# Patient Record
Sex: Male | Born: 1950 | Race: White | Hispanic: No | Marital: Married | Smoking: Former smoker
Health system: Southern US, Community
[De-identification: ages and names within clinical notes are randomized; demographics above are authoritative.]

## PROBLEM LIST (undated history)

## (undated) DIAGNOSIS — R911 Solitary pulmonary nodule: Secondary | ICD-10-CM

## (undated) DIAGNOSIS — R609 Edema, unspecified: Secondary | ICD-10-CM

## (undated) DIAGNOSIS — I2510 Atherosclerotic heart disease of native coronary artery without angina pectoris: Secondary | ICD-10-CM

## (undated) DIAGNOSIS — I10 Essential (primary) hypertension: Secondary | ICD-10-CM

## (undated) DIAGNOSIS — B348 Other viral infections of unspecified site: Secondary | ICD-10-CM

## (undated) DIAGNOSIS — I739 Peripheral vascular disease, unspecified: Secondary | ICD-10-CM

## (undated) DIAGNOSIS — M1990 Unspecified osteoarthritis, unspecified site: Secondary | ICD-10-CM

## (undated) DIAGNOSIS — M25561 Pain in right knee: Secondary | ICD-10-CM

## (undated) DIAGNOSIS — I701 Atherosclerosis of renal artery: Secondary | ICD-10-CM

## (undated) DIAGNOSIS — J40 Bronchitis, not specified as acute or chronic: Secondary | ICD-10-CM

## (undated) DIAGNOSIS — I7090 Unspecified atherosclerosis: Secondary | ICD-10-CM

## (undated) DIAGNOSIS — Z87891 Personal history of nicotine dependence: Secondary | ICD-10-CM

## (undated) DIAGNOSIS — J449 Chronic obstructive pulmonary disease, unspecified: Secondary | ICD-10-CM

## (undated) DIAGNOSIS — M25562 Pain in left knee: Secondary | ICD-10-CM

## (undated) DIAGNOSIS — R945 Abnormal results of liver function studies: Secondary | ICD-10-CM

## (undated) DIAGNOSIS — D61818 Other pancytopenia: Secondary | ICD-10-CM

## (undated) DIAGNOSIS — E441 Mild protein-calorie malnutrition: Secondary | ICD-10-CM

## (undated) DIAGNOSIS — J9611 Chronic respiratory failure with hypoxia: Secondary | ICD-10-CM

## (undated) DIAGNOSIS — I6529 Occlusion and stenosis of unspecified carotid artery: Secondary | ICD-10-CM

## (undated) HISTORY — DX: Unspecified osteoarthritis, unspecified site: M19.90

## (undated) HISTORY — DX: Atherosclerosis of renal artery: I70.1

## (undated) HISTORY — DX: Chronic respiratory failure with hypoxia: J96.11

## (undated) HISTORY — DX: Solitary pulmonary nodule: R91.1

## (undated) HISTORY — DX: Mild protein-calorie malnutrition: E44.1

## (undated) HISTORY — DX: Occlusion and stenosis of unspecified carotid artery: I65.29

## (undated) HISTORY — DX: Peripheral vascular disease, unspecified: I73.9

## (undated) HISTORY — PX: PR VEIN BYPASS GRAFT,AORTO-FEM-POP: 35551

## (undated) HISTORY — DX: Bronchitis, not specified as acute or chronic: J40

## (undated) HISTORY — DX: Chronic obstructive pulmonary disease, unspecified: J44.9

## (undated) HISTORY — PX: CARDIAC CATHETERIZATION: SHX172

## (undated) HISTORY — DX: Other pancytopenia: D61.818

## (undated) HISTORY — DX: Atherosclerotic heart disease of native coronary artery without angina pectoris: I25.10

## (undated) HISTORY — DX: Other viral infections of unspecified site: B34.8

## (undated) HISTORY — DX: Unspecified atherosclerosis: I70.90

## (undated) HISTORY — DX: Pain in left knee: M25.562

## (undated) HISTORY — DX: Pain in right knee: M25.561

## (undated) HISTORY — PX: KNEE SURGERY: SHX244

## (undated) HISTORY — DX: Essential (primary) hypertension: I10

## (undated) HISTORY — DX: Edema, unspecified: R60.9

## (undated) HISTORY — DX: Personal history of nicotine dependence: Z87.891

---

## 2002-12-07 DIAGNOSIS — I701 Atherosclerosis of renal artery: Secondary | ICD-10-CM

## 2002-12-07 HISTORY — DX: Atherosclerosis of renal artery: I70.1

## 2003-01-09 HISTORY — PX: JOINT REPLACEMENT: SHX530

## 2003-05-20 ENCOUNTER — Inpatient Hospital Stay (HOSPITAL_COMMUNITY): Admission: EM | Admit: 2003-05-20 | Discharge: 2003-05-22 | Payer: Self-pay | Admitting: Internal Medicine

## 2005-04-09 ENCOUNTER — Ambulatory Visit: Payer: Self-pay | Admitting: Cardiology

## 2011-09-05 ENCOUNTER — Other Ambulatory Visit: Payer: Self-pay

## 2011-09-05 DIAGNOSIS — I70219 Atherosclerosis of native arteries of extremities with intermittent claudication, unspecified extremity: Secondary | ICD-10-CM

## 2011-09-24 ENCOUNTER — Encounter: Payer: Self-pay | Admitting: Vascular Surgery

## 2011-09-25 ENCOUNTER — Encounter (INDEPENDENT_AMBULATORY_CARE_PROVIDER_SITE_OTHER): Payer: Medicare Other | Admitting: *Deleted

## 2011-09-25 ENCOUNTER — Ambulatory Visit (INDEPENDENT_AMBULATORY_CARE_PROVIDER_SITE_OTHER): Payer: Medicare Other | Admitting: Vascular Surgery

## 2011-09-25 ENCOUNTER — Ambulatory Visit (INDEPENDENT_AMBULATORY_CARE_PROVIDER_SITE_OTHER): Payer: Medicare Other | Admitting: *Deleted

## 2011-09-25 ENCOUNTER — Encounter: Payer: Self-pay | Admitting: Vascular Surgery

## 2011-09-25 VITALS — BP 107/67 | HR 98 | Resp 20 | Ht 72.0 in | Wt 182.0 lb

## 2011-09-25 DIAGNOSIS — Z48812 Encounter for surgical aftercare following surgery on the circulatory system: Secondary | ICD-10-CM

## 2011-09-25 DIAGNOSIS — I739 Peripheral vascular disease, unspecified: Secondary | ICD-10-CM

## 2011-09-25 DIAGNOSIS — I70219 Atherosclerosis of native arteries of extremities with intermittent claudication, unspecified extremity: Secondary | ICD-10-CM

## 2011-09-25 HISTORY — DX: Peripheral vascular disease, unspecified: I73.9

## 2011-09-25 NOTE — Progress Notes (Signed)
[facility]ist [facility]   Patient name: Shaun Schmitt MRN: [id number] DOB: February 02, 1950 Sex: male   Referred by: Leonia Reader  Reason for referral:  Chief Complaint  Patient presents with  . New Evaluation    CHECK PAIN IN B/L LOWER EXTREMITIES, NUMBNESS    HISTORY OF PRESENT ILLNESS: Patient is a 61 year old gentleman who presents today with severe bilateral lower extremity claudication symptoms. He has very complex past history. He did undergo a right to left fem-fem bypass [place] approximately 10 years ago. He also had right renal artery stenting during the same time.Marland Kitchen He does now with multiple complaints of his lower Shoney's. He has very limited walking ability and reports that he has aching and cramping cannot continue to walk. His mainly his claudication type symptoms but he also has numbness in both feet. He does have a history of 3 problems with the left hip surgery. He has a significant history of coronary artery disease as well. Fortunately despite all this he does continue to have half pack per day cigarette smoking.  Past Medical History  Diagnosis Date  . Pain in both knees     Right worse , bilateral Hips and ankles, numbness   . Arthritis     Gout  . Myocardial infarction     X's 2  . Carotid artery occlusion   . COPD (chronic obstructive pulmonary disease)   . Emphysema   . Hypertension   . Renal artery stenosis 12/07/2002    Stents    Past Surgical History  Procedure Date  . Joint replacement 2005    Right Hip  . Pr vein bypass graft,aorto-fem-pop   . Knee surgery     History   Social History  . Marital Status: Married    Spouse Name: N/A    Number of Children: N/A  . Years of Education: N/A   Occupational History  . Not on file.   Social History Main Topics  . Smoking status: Current Every Day Smoker -- 0.5 packs/day for 40 years    Types: Cigarettes  . Smokeless tobacco: Current User  . Alcohol Use: No    . Drug Use: No  . Sexually Active: Not on file   Other Topics Concern  . Not on file   Social History Narrative  . No narrative on file    Family History  Problem Relation Age of Onset  . Emphysema Mother   . Hypertension Mother   . Emphysema Father   . Hypertension Father     Allergies as of 09/25/2011 - Review Complete 09/25/2011  Allergen Reaction Noted  . Codeine  09/17/2011    Current Outpatient Prescriptions on File Prior to Visit  Medication Sig Dispense Refill  . aspirin 81 MG tablet Take 81 mg by mouth daily.      . clopidogrel (PLAVIX) 75 MG tablet Take 75 mg by mouth daily.      . Fluticasone-Salmeterol (ADVAIR DISKUS) 100-50 MCG/DOSE AEPB Inhale 1 puff into the lungs every 12 (twelve) hours.      . hydrochlorothiazide (HYDRODIURIL) 25 MG tablet Take 25 mg by mouth daily.      . Ipratropium-Albuterol (COMBIVENT RESPIMAT) 20-100 MCG/ACT AERS respimat Inhale into the lungs every 6 (six) hours.      Marland Kitchen lisinopril (PRINIVIL,ZESTRIL) 20 MG tablet Take 20 mg by mouth daily.      . metoprolol succinate (TOPROL-XL) 100 MG 24 hr tablet Take 100 mg by mouth daily. Take with  or immediately following a meal.      . pravastatin (PRAVACHOL) 20 MG tablet Take 20 mg by mouth daily.      . furosemide (LASIX) 20 MG tablet Take 20 mg by mouth 2 (two) times daily.         REVIEW OF SYSTEMS:  Positives indicated with an "X"  CARDIOVASCULAR:  [ ]  chest pain   [ ]  chest pressure   [ ]  palpitations   [ ]  orthopnea   [x ] dyspnea on exertion   [x ] claudication   [ ]  rest pain   [ ]  DVT   [ ]  phlebitis PULMONARY:   [ ]  productive cough   [ ]  asthma   [ ]  wheezing NEUROLOGIC:   [x ] weakness  [ x] paresthesias  [ ]  aphasia  [ ]  amaurosis  [ ]  dizziness HEMATOLOGIC:   [ ]  bleeding problems   [ ]  clotting disorders MUSCULOSKELETAL:  [ ]  joint pain   [ ]  joint swelling GASTROINTESTINAL: [ ]   blood in stool  [ ]   hematemesis GENITOURINARY:  [ ]   dysuria  [ ]   hematuria PSYCHIATRIC:  [  ] history of major depression INTEGUMENTARY:  [ ]  rashes  [ ]  ulcers CONSTITUTIONAL:  [ ]  fever   [ ]  chills  PHYSICAL EXAMINATION:  General: The patient is a well-nourished male, in no acute distress. Vital signs are BP 107/67  Pulse 98  Resp 20  Ht 6' (1.829 m)  Wt 182 lb (82.555 kg)  BMI 24.68 kg/m2 Pulmonary: There is a good air exchange bilaterally without wheezing or rales. Abdomen: Soft and non-tender with normal pitch bowel sounds. Musculoskeletal: There are no major deformities.  There is no significant extremity pain. Neurologic: No focal weakness or paresthesias are detected, Skin: There are no ulcer or rashes noted. Psychiatric: The patient has normal affect. Cardiovascular: There is a regular rate and rhythm without significant murmur appreciated. Pulse status is noted for a diminished 1+ right femoral pulse. There is absent left femoral pulse. I do not palpate a femoral-femoral graft pulse. Carotid arteries a bruits bilaterally  [facility] Vascular Lab Studies:  Ordered and Independently Reviewed  Index of 0.5 bilaterally. Imaging reveals occlusion of his left external and common iliac artery. There is a high-grade stenosis above his right to left fem-fem bypass in his external iliac artery.  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 I discussed this at length with the patient and his wife present. I do feel there also neurologic changes resulting in some of the resting symptoms that he had. He is severely limited by this and I have recommended arteriography for further evaluation. I explained that he may be a candidate for stenting of his external iliac lesion but also may require surgical treatment of this.  He also has a history of significant carotid disease with poor documentation this. He reports been several years  since he had a carotid duplex. I explained we would obtain this at some point during this evaluation to further clarify this. We will plan a elective outpatient arteriography and possible angioplasty if his convenience    Harald Quevedo [facility] [place] Office: [phone]

## 2011-10-09 ENCOUNTER — Other Ambulatory Visit: Payer: Self-pay | Admitting: *Deleted

## 2011-10-09 ENCOUNTER — Encounter: Payer: Self-pay | Admitting: *Deleted

## 2011-10-30 MED ORDER — SODIUM CHLORIDE 0.9 % IV SOLN
INTRAVENOUS | Status: DC
  Administered 2011-10-31: 10:00:00 via INTRAVENOUS

## 2011-10-31 ENCOUNTER — Ambulatory Visit (HOSPITAL_COMMUNITY)
Admission: RE | Admit: 2011-10-31 | Discharge: 2011-10-31 | Disposition: A | Discharge status: Home / Self Care | Payer: Medicare Other | Source: Ambulatory Visit | Attending: Vascular Surgery | Admitting: Vascular Surgery

## 2011-10-31 ENCOUNTER — Encounter (HOSPITAL_COMMUNITY)
Admission: RE | Disposition: A | Discharge status: Home / Self Care | Payer: Self-pay | Source: Ambulatory Visit | Attending: Vascular Surgery

## 2011-10-31 DIAGNOSIS — I708 Atherosclerosis of other arteries: Secondary | ICD-10-CM | POA: Insufficient documentation

## 2011-10-31 DIAGNOSIS — J4489 Other specified chronic obstructive pulmonary disease: Secondary | ICD-10-CM | POA: Insufficient documentation

## 2011-10-31 DIAGNOSIS — I10 Essential (primary) hypertension: Secondary | ICD-10-CM | POA: Insufficient documentation

## 2011-10-31 DIAGNOSIS — I70219 Atherosclerosis of native arteries of extremities with intermittent claudication, unspecified extremity: Secondary | ICD-10-CM | POA: Insufficient documentation

## 2011-10-31 DIAGNOSIS — J449 Chronic obstructive pulmonary disease, unspecified: Secondary | ICD-10-CM | POA: Insufficient documentation

## 2011-10-31 DIAGNOSIS — I739 Peripheral vascular disease, unspecified: Secondary | ICD-10-CM

## 2011-10-31 HISTORY — PX: ABDOMINAL AORTAGRAM: SHX5454

## 2011-10-31 LAB — POCT I-STAT, CHEM 8
Calcium, Ion: 1.22 mmol/L (ref 1.12–1.23)
Creatinine, Ser: 1.2 mg/dL (ref 0.50–1.35)
Potassium: 4.1 mEq/L (ref 3.5–5.1)
Sodium: 139 mEq/L (ref 135–145)
TCO2: 22 mmol/L (ref 0–100)

## 2011-10-31 SURGERY — ABDOMINAL AORTAGRAM
Anesthesia: LOCAL

## 2011-10-31 MED ORDER — ACETAMINOPHEN 325 MG PO TABS
ORAL_TABLET | ORAL | Status: AC
  Filled 2011-10-31: qty 2

## 2011-10-31 MED ORDER — ACETAMINOPHEN 325 MG PO TABS: 650.0000 mg | ORAL_TABLET | ORAL | Status: DC | PRN

## 2011-10-31 MED ORDER — ACETAMINOPHEN 325 MG PO TABS
650.0000 mg | ORAL_TABLET | ORAL | Status: DC | PRN
  Administered 2011-10-31: 650 mg via ORAL

## 2011-10-31 MED ORDER — NITROGLYCERIN 0.2 MG/ML ON CALL CATH LAB
INTRAVENOUS | Status: AC
  Filled 2011-10-31: qty 1

## 2011-10-31 MED ORDER — SODIUM CHLORIDE 0.9 % IJ SOLN: 3.0000 mL | INTRAMUSCULAR | Status: DC | PRN

## 2011-10-31 MED ORDER — SODIUM CHLORIDE 0.9 % IV SOLN: INTRAVENOUS | Status: DC

## 2011-10-31 MED ORDER — ONDANSETRON HCL 4 MG/2ML IJ SOLN: 4.0000 mg | Freq: Four times a day (QID) | INTRAMUSCULAR | Status: DC | PRN

## 2011-10-31 MED ORDER — SODIUM CHLORIDE 0.9 % IJ SOLN: 3.0000 mL | Freq: Two times a day (BID) | INTRAMUSCULAR | Status: DC

## 2011-10-31 MED ORDER — LIDOCAINE HCL (PF) 1 % IJ SOLN
INTRAMUSCULAR | Status: AC
  Filled 2011-10-31: qty 30

## 2011-10-31 MED ORDER — FENTANYL CITRATE 0.05 MG/ML IJ SOLN
INTRAMUSCULAR | Status: AC
  Filled 2011-10-31: qty 2

## 2011-10-31 MED ORDER — MIDAZOLAM HCL 2 MG/2ML IJ SOLN
INTRAMUSCULAR | Status: AC
  Filled 2011-10-31: qty 2

## 2011-10-31 MED ORDER — SODIUM CHLORIDE 0.9 % IV SOLN: 250.0000 mL | INTRAVENOUS | Status: DC | PRN

## 2011-10-31 MED ORDER — HEPARIN (PORCINE) IN NACL 2-0.9 UNIT/ML-% IJ SOLN
INTRAMUSCULAR | Status: AC
  Filled 2011-10-31: qty 1000

## 2011-10-31 MED ORDER — HEPARIN SODIUM (PORCINE) 1000 UNIT/ML IJ SOLN
INTRAMUSCULAR | Status: AC
  Filled 2011-10-31: qty 1

## 2011-10-31 NOTE — Op Note (Signed)
OPERATIVE REPORT  DATE OF SURGERY: 10/31/2011  PATIENT: Shaun Schmitt, 61 y.o. male MRN: [id number]  DOB: January 21, 1950  PRE-OPERATIVE DIAGNOSIS: Bilateral lower extremity arterial insufficiency and claudication  POST-OPERATIVE DIAGNOSIS:  Same  PROCEDURE: Aortogram bilateral reduction runoff  SURGEON:  Gretta Began, M.D.   ANESTHESIA:  With IV sedation  EBL: Minimal ml     BLOOD ADMINISTERED: None  DRAINS: None  SPECIMEN: None  COUNTS CORRECT:  YES  PLAN OF CARE: Cath lab holding area   PATIENT DISPOSITION:  PACU - hemodynamically stable  PROCEDURE DETAILS: The patient was taken to the upper and placed supine position where the area of both groins are prepped and draped in sterile fashion. Using ultrasound visualization the right common femoral artery was entered with a 18-gauge needle a above the level of the prior right to left fem-fem bypass. Catheter would not go past the external iliac level and therefore a end hole catheter was passed over the Bentson wire and a Glidewire was used to pass catheter up to the level suprarenal aorta. A 5 French sheath was then passed over the guidewire. A pigtail catheter was this level the suprarenal aorta. AP projections were undertaken. This revealed a patent aorta. The patient had mild stenosis at the takeoff of the left renal artery. The patient had no flow in the right renal artery. The patient had a prior placed right renal artery stent at an outlying hospital several years ago. The patient had 2 stents which were not in communication. The more proximal stent was extended far out into the aorta.  Pigtail catheter was then pulled down the level of the infrarenal aorta and the additional views were undertaken. There was complete occlusion of the stenosis of the external iliac artery with the sheath. The patient was given 3000 units heparin. There was wide patency of the common iliac artery on the right there was complete occlusion of the  left iliac system. There was a large collateral from the inferior mesenteric artery that a refill the common femoral artery on the right via collaterals. The 5 French sheath was then passed pulled down further in the distal external iliac artery below the level of the stenosis. Runoff was obtained to this. This did reveal a patent right to left fem-fem bypass. On the right leg there was complete occlusion of the superficial femoral at its origin with reconstitution of the above-knee popliteal and three-vessel runoff. On the left there did not appear to be any evidence of stenosis at the femoral anastomosis of the right to left fem-fem bypass. There was patency of the superficial femoral and profundus femoris arteries. There was three-vessel runoff on the left. The area of stenosis was just above the inguinal ligament is not felt to be amenable to operative treatment since it was at the groin crease. The patient tolerated the complication and the sheath was pulled and the appropriate pressure was held as typical.  Findings #1 patent aorta and right common iliac artery with narrowed right external iliac artery and severe stenosis at the junction of the external iliac and common femoral artery #2 l occlusion r prior placed right renal artery stents with the more proximal of 2 stents extending far out into the aorta #3 mild narrowing at the proximal left renal artery #4 patent right to left fem-fem bypass #5 right superficial femoral artery occlusion with reconstitution above-knee popliteal artery and three-vessel runoff #6 patent left superficial femoral artery with three-vessel runoff    Gretta Began, M.D. 10/31/2011  4:32 PM

## 2011-10-31 NOTE — H&P (Signed)
  Shaun Schmitt   09/25/2011 2:30 PM Office Visit  MRN: [id number]   Description: 60 year old male  Provider: Destry Bezdek, MD  Department: [facility]        Diagnoses     PVD (peripheral vascular disease)   - Primary    443.9      Reason for Visit     New Evaluation    CHECK PAIN IN B/L LOWER EXTREMITIES, NUMBNESS        Vitals - Last Recorded       BP Pulse Resp Ht Wt BMI    107/67 98 20 6' (1.829 m) 182 lb (82.555 kg) 24.68 kg/m2       Progress Notes     Louiza Moor, MD  09/25/2011  5:29 PM  Signed [facility]ist [facility]     Patient name: Shaun Schmitt           MRN: [id number]        DOB: 11/12/1950        Sex: male     Referred by: Van Eyk   Reason for referral:  Chief Complaint   Patient presents with   .  New Evaluation       CHECK PAIN IN B/L LOWER EXTREMITIES, NUMBNESS      HISTORY OF PRESENT ILLNESS: Patient is a 60-year-old gentleman who presents today with severe bilateral lower extremity claudication symptoms. He has very complex past history. He did undergo a right to left fem-fem bypass [place] an approximately 10 years ago. He also had right renal artery stenting during the same time.. He does now with multiple complaints of his lower Shoney's. He has very limited walking ability and reports that he has aching and cramping cannot continue to walk. His mainly his claudication type symptoms but he also has numbness in both feet. He does have a history of 3 problems with the left hip surgery. He has a significant history of coronary artery disease as well. Fortunately despite all this he does continue to have half pack per day cigarette smoking.    Past Medical History   Diagnosis  Date   .  Pain in both knees         Right worse , bilateral Hips and ankles, numbness    .  Arthritis         Gout   .  Myocardial infarction         X's 2   .  Carotid artery occlusion     .  COPD (chronic obstructive  pulmonary disease)     .  Emphysema     .  Hypertension     .  Renal artery stenosis  12/07/2002       Stents       Past Surgical History   Procedure  Date   .  Joint replacement  2005       Right Hip   .  Pr vein bypass graft,aorto-fem-pop     .  Knee surgery         History       Social History   .  Marital Status:  Married       Spouse Name:  N/A       Number of Children:  N/A   .  Years of Education:  N/A       Occupational History   .  Not on file.       Social   History Main Topics   .  Smoking status:  Current Every Day Smoker -- 0.5 packs/day for 40 years       Types:  Cigarettes   .  Smokeless tobacco:  Current User   .  Alcohol Use:  No   .  Drug Use:  No   .  Sexually Active:  Not on file       Other Topics  Concern   .  Not on file       Social History Narrative   .  No narrative on file       Family History   Problem  Relation  Age of Onset   .  Emphysema  Mother     .  Hypertension  Mother     .  Emphysema  Father     .  Hypertension  Father         Allergies as of 09/25/2011 - Review Complete 09/25/2011   Allergen  Reaction  Noted   .  Codeine    09/17/2011       Current Outpatient Prescriptions on File Prior to Visit   Medication  Sig  Dispense  Refill   .  aspirin 81 MG tablet  Take 81 mg by mouth daily.         .  clopidogrel (PLAVIX) 75 MG tablet  Take 75 mg by mouth daily.         .  Fluticasone-Salmeterol (ADVAIR DISKUS) 100-50 MCG/DOSE AEPB  Inhale 1 puff into the lungs every 12 (twelve) hours.         .  hydrochlorothiazide (HYDRODIURIL) 25 MG tablet  Take 25 mg by mouth daily.         .  Ipratropium-Albuterol (COMBIVENT RESPIMAT) 20-100 MCG/ACT AERS respimat  Inhale into the lungs every 6 (six) hours.         .  lisinopril (PRINIVIL,ZESTRIL) 20 MG tablet  Take 20 mg by mouth daily.         .  metoprolol succinate (TOPROL-XL) 100 MG 24 hr tablet  Take 100 mg by mouth daily. Take with or immediately following a meal.         .   pravastatin (PRAVACHOL) 20 MG tablet  Take 20 mg by mouth daily.         .  furosemide (LASIX) 20 MG tablet  Take 20 mg by mouth 2 (two) times daily.              REVIEW OF SYSTEMS:   Positives indicated with an "X"   CARDIOVASCULAR:  [ ] chest pain   [ ] chest pressure   [ ] palpitations   [ ] orthopnea               [x ] dyspnea on exertion   [x ] claudication   [ ] rest pain   [ ] DVT   [ ] phlebitis PULMONARY:   [ ] productive cough   [ ] asthma   [ ] wheezing NEUROLOGIC:   [x ] weakness  [ x] paresthesias  [ ] aphasia  [ ] amaurosis  [ ] dizziness HEMATOLOGIC:   [ ] bleeding problems   [ ] clotting disorders MUSCULOSKELETAL:  [ ] joint pain   [ ] joint swelling GASTROINTESTINAL: [ ]  blood in stool  [ ]  hematemesis GENITOURINARY:  [ ]  dysuria  [ ]  hematuria PSYCHIATRIC:  [ ] history of major depression   INTEGUMENTARY:  [ ] rashes  [ ] ulcers CONSTITUTIONAL:  [ ] fever   [ ] chills   PHYSICAL EXAMINATION:   General: The patient is a well-nourished male, in no acute distress. Vital signs are BP 107/67  Pulse 98  Resp 20  Ht 6' (1.829 m)  Wt 182 lb (82.555 kg)  BMI 24.68 kg/m2 Pulmonary: There is a good air exchange bilaterally without wheezing or rales. Abdomen: Soft and non-tender with normal pitch bowel sounds. Musculoskeletal: There are no major deformities.  There is no significant extremity pain. Neurologic: No focal weakness or paresthesias are detected, Skin: There are no ulcer or rashes noted. Psychiatric: The patient has normal affect. Cardiovascular: There is a regular rate and rhythm without significant murmur appreciated. Pulse status is noted for a diminished 1+ right femoral pulse. There is absent left femoral pulse. I do not palpate a femoral-femoral graft pulse. Carotid arteries a bruits bilaterally   [facility] V[facility] Lab Studies:   Ordered and Independently Reviewed   Index of 0.5 bilaterally. Imaging reveals occlusion of his left external and  common iliac artery. There is a high-grade stenosis above his right to left fem-fem bypass in his external iliac artery.   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 I discussed this at length with the patient and his wife present. I do feel there also neurologic changes resulting in some of the resting symptoms that he had. He is severely limited by this and I have recommended arteriography for further evaluation. I explained that he may be a candidate for stenting of his external iliac lesion but also may require surgical treatment of this.   He also has a history of significant carotid disease with poor documentation this. He reports been several years since he had a carotid duplex. I explained we would obtain this at some point during this evaluation to further clarify this. We will plan a elective outpatient arteriography and possible angioplasty if his convenience       Nari Vannatter [facility] Office: [phone]            Addendum:  The patient has been re-examined and re-evaluated.  The patient's history and physical has been reviewed and is unchanged.    Nickie Terhaar is a 60 y.o. male is being admitted with PVD. All the risks, benefits and other treatment options have been discussed with the patient. The patient has consented to proceed with Procedure(s): ABDOMINAL AORTAGRAM as a surgical intervention.  Clayson Riling 10/31/2011 11:45 AM Vascular and Vein Surgery         

## 2011-10-31 NOTE — Progress Notes (Signed)
CLIENT UP AND WALKED AND TOL WELL; RIGHT GROIN STABLE, NO BLEEDING OR HEMATOMA 

## 2011-11-15 ENCOUNTER — Other Ambulatory Visit: Payer: Self-pay | Admitting: *Deleted

## 2011-11-15 ENCOUNTER — Encounter (HOSPITAL_COMMUNITY): Payer: Self-pay | Admitting: Pharmacy Technician

## 2011-11-17 NOTE — Pre-Procedure Instructions (Signed)
20 Shaun Schmitt   11/17/2011   Your procedure is scheduled on: Monday, November 18th  Report to [facility] at  7:30 AM.   Call this number if you have problems the morning of surgery: [phone]   Remember:   Do not eat food or drink any liquids:After Midnight Sunday.    Take these medicines the morning of surgery with A SIP OF WATER: Advair, Combivent Metoprolol, Tramadol   Do not wear jewelry.  Do not wear lotions, powders, or colognes.  You may NOT wear deodorant.   Men may shave face and neck.   Do not bring valuables to the hospital.  Contacts, dentures or bridgework may not be worn into surgery.   Leave suitcase in the car. After surgery it may be brought to your room.  For patients admitted to the hospital, checkout time is 11:00 AM the day of discharge.   Patients discharged the day of surgery will not be allowed to drive home.   Name and phone number of your driver:    Special Instructions: Shower using CHG 2 nights before surgery and the night before surgery.  If you shower the day of surgery use CHG.  Use special wash - you have one bottle of CHG for all showers.  You should use approximately 1/3 of the bottle for each shower.   Please read over the following fact sheets that you were given: Pain Booklet, MRSA Information and Surgical Site Infection Prevention

## 2011-11-19 ENCOUNTER — Encounter (HOSPITAL_COMMUNITY): Payer: Self-pay

## 2011-11-19 ENCOUNTER — Encounter (HOSPITAL_COMMUNITY)
Admission: RE | Admit: 2011-11-19 | Discharge: 2011-11-19 | Disposition: A | Discharge status: Home / Self Care | Payer: Medicare Other | Source: Ambulatory Visit | Attending: Vascular Surgery | Admitting: Vascular Surgery

## 2011-11-19 LAB — CBC
HCT: 39 % (ref 39.0–52.0)
Hemoglobin: 13 g/dL (ref 13.0–17.0)
MCV: 90.9 fL (ref 78.0–100.0)
Platelets: 197 10*3/uL (ref 150–400)
RBC: 4.29 MIL/uL (ref 4.22–5.81)
WBC: 10.4 10*3/uL (ref 4.0–10.5)

## 2011-11-19 LAB — COMPREHENSIVE METABOLIC PANEL
AST: 15 U/L (ref 0–37)
CO2: 27 mEq/L (ref 19–32)
Chloride: 100 mEq/L (ref 96–112)
Creatinine, Ser: 1.04 mg/dL (ref 0.50–1.35)
GFR calc non Af Amer: 77 mL/min — ABNORMAL LOW (ref >90)
Total Bilirubin: 0.3 mg/dL (ref 0.3–1.2)

## 2011-11-19 LAB — PROTIME-INR: Prothrombin Time: 13.9 seconds (ref 11.6–15.2)

## 2011-11-19 NOTE — Progress Notes (Addendum)
SPOKE WITH DR Krista Blue REGARDING THIS PATIENT.  PMH-10-12 YR AGO HAD 'SOB'--DESCRIBED AS CATCHING...WENT TO ER @ [facility]--HAD MI X 2. HE SEES NO HEART DOC NOW.Marland KitchenFEELS FINE, EXCEPT FOR OCCAS SOB D/T COPD/EMPHEYSEMA.(.he STATES HE CAN TELL THE DIFFERENCE FROM HIS HEART AND LUNGS. )  AND THE PAIN IN HIS LEGS WHEN HE WALKS. OCT 2013 EKG 'NSR'..DR. Krista Blue OK --CAN PROCEED..  ?? HAD CATH AT [place]....WILL TRY AND GET...DA

## 2011-11-21 ENCOUNTER — Encounter (HOSPITAL_COMMUNITY): Payer: Self-pay | Admitting: Vascular Surgery

## 2011-11-21 NOTE — Consult Note (Signed)
Anesthesia chart review: Patient is a 61 year old male scheduled for right external iliac artery endarterectomy with patch angioplasty by Dr. Arbie Cookey on 11/26/2011. History includes smoking, COPD, hypertension, CAD with reported history of MI '02 (cath 05/21/00 showed 50% ostial OM, 25-30% mid RCA), PAD with history of right renal artery stent and [facility] > 10 years ago, and question of carotid artery disease which Dr. Arbie Cookey plans to further evaluate in the future.  Preoperative labs noted.  He will need a UA (ordered by surgeon) and a T&S on arrival.  Chest x-ray on 11/19/2011 showed COPD without acute abnormality.  EKG on 10/31/11 showed NSR.  Cardiac cath at [facility] on 05/21/00 (done for positive stress test) showed normal left main that divides into the LAD and circumflex arteries.  The LAD artery did not have any significant or critical stenosis. The DIAG branches showed possible evidence of diffuse disease. The circumflex did not have any wall irregularities or any stenoses, however at the origin of the OM there was 50% ostial stenosis which did not appear to be flow limiting.  The RCA was dominant and had 25-30% mid stenosis.  The distal right coronary artery had wall irregularities but no obstructive or critical stenosis. Collaterals from the distal RCA to the left circulation collaterals were visualized. The collaterals appear to be in the area supplied by the left anterior descending artery and its branches. 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 Medical therapy was recommended at that time.  His history was reviewed with Anesthesiologist Dr. Krista Blue on 11/19/11 prior to receipt of cardiac cath results.  (See note from Marrianne Mood, RN.)  Today I reviewed cardiac cath results and history with Anesthesiologist Dr. Gypsy Balsam.  If no acute CV symptoms then plan to proceed.  He will be  evaluated by his assigned anesthesiologist on the day of surgery.  Shonna Chock, PA-C 11/21/11 1149

## 2011-11-25 MED ORDER — DEXTROSE 5 % IV SOLN
1.5000 g | INTRAVENOUS | Status: AC
  Administered 2011-11-26: 1.5 g via INTRAVENOUS
  Filled 2011-11-25: qty 1.5

## 2011-11-26 ENCOUNTER — Encounter (HOSPITAL_COMMUNITY)
Admission: RE | Disposition: A | Discharge status: Home / Self Care | Payer: Self-pay | Source: Ambulatory Visit | Attending: Vascular Surgery

## 2011-11-26 ENCOUNTER — Encounter (HOSPITAL_COMMUNITY): Payer: Self-pay | Admitting: Vascular Surgery

## 2011-11-26 ENCOUNTER — Encounter (HOSPITAL_COMMUNITY): Payer: Self-pay | Admitting: Surgery

## 2011-11-26 ENCOUNTER — Ambulatory Visit (HOSPITAL_COMMUNITY): Payer: Medicare Other | Admitting: Vascular Surgery

## 2011-11-26 ENCOUNTER — Inpatient Hospital Stay (HOSPITAL_COMMUNITY)
Admission: RE | Admit: 2011-11-26 | Discharge: 2011-11-27 | DRG: 254 | Disposition: A | Discharge status: Home / Self Care | Payer: Medicare Other | Source: Ambulatory Visit | Attending: Vascular Surgery | Admitting: Vascular Surgery

## 2011-11-26 DIAGNOSIS — I6529 Occlusion and stenosis of unspecified carotid artery: Secondary | ICD-10-CM | POA: Diagnosis present

## 2011-11-26 DIAGNOSIS — I252 Old myocardial infarction: Secondary | ICD-10-CM

## 2011-11-26 DIAGNOSIS — N289 Disorder of kidney and ureter, unspecified: Secondary | ICD-10-CM | POA: Diagnosis present

## 2011-11-26 DIAGNOSIS — M25569 Pain in unspecified knee: Secondary | ICD-10-CM | POA: Diagnosis present

## 2011-11-26 DIAGNOSIS — M109 Gout, unspecified: Secondary | ICD-10-CM | POA: Diagnosis present

## 2011-11-26 DIAGNOSIS — Z96649 Presence of unspecified artificial hip joint: Secondary | ICD-10-CM

## 2011-11-26 DIAGNOSIS — Z79899 Other long term (current) drug therapy: Secondary | ICD-10-CM

## 2011-11-26 DIAGNOSIS — I739 Peripheral vascular disease, unspecified: Secondary | ICD-10-CM

## 2011-11-26 DIAGNOSIS — I10 Essential (primary) hypertension: Secondary | ICD-10-CM | POA: Diagnosis present

## 2011-11-26 DIAGNOSIS — Z7982 Long term (current) use of aspirin: Secondary | ICD-10-CM

## 2011-11-26 DIAGNOSIS — J438 Other emphysema: Secondary | ICD-10-CM | POA: Diagnosis present

## 2011-11-26 DIAGNOSIS — I70219 Atherosclerosis of native arteries of extremities with intermittent claudication, unspecified extremity: Principal | ICD-10-CM | POA: Diagnosis present

## 2011-11-26 DIAGNOSIS — Z885 Allergy status to narcotic agent status: Secondary | ICD-10-CM

## 2011-11-26 DIAGNOSIS — F17200 Nicotine dependence, unspecified, uncomplicated: Secondary | ICD-10-CM | POA: Diagnosis present

## 2011-11-26 DIAGNOSIS — I2510 Atherosclerotic heart disease of native coronary artery without angina pectoris: Secondary | ICD-10-CM | POA: Diagnosis present

## 2011-11-26 DIAGNOSIS — Z7902 Long term (current) use of antithrombotics/antiplatelets: Secondary | ICD-10-CM

## 2011-11-26 HISTORY — PX: PATCH ANGIOPLASTY: SHX6230

## 2011-11-26 HISTORY — PX: REPAIR ILIAC ARTERY: SHX6216

## 2011-11-26 LAB — URINALYSIS, ROUTINE W REFLEX MICROSCOPIC
Bilirubin Urine: NEGATIVE
Hgb urine dipstick: NEGATIVE
Specific Gravity, Urine: 1.021 (ref 1.005–1.030)
Urobilinogen, UA: 0.2 mg/dL (ref 0.0–1.0)
pH: 5 (ref 5.0–8.0)

## 2011-11-26 LAB — TYPE AND SCREEN: Antibody Screen: NEGATIVE

## 2011-11-26 SURGERY — ANGIOPLASTY, USING PATCH GRAFT
Anesthesia: General | Site: Groin | Laterality: Right | Wound class: Clean

## 2011-11-26 MED ORDER — PHENYLEPHRINE HCL 10 MG/ML IJ SOLN
10.0000 mg | INTRAVENOUS | Status: DC | PRN
  Administered 2011-11-26: 15 ug/min via INTRAVENOUS

## 2011-11-26 MED ORDER — ONDANSETRON HCL 4 MG/2ML IJ SOLN
INTRAMUSCULAR | Status: DC | PRN
  Administered 2011-11-26: 4 mg via INTRAVENOUS

## 2011-11-26 MED ORDER — LACTATED RINGERS IV SOLN
INTRAVENOUS | Status: DC | PRN
  Administered 2011-11-26 (×2): via INTRAVENOUS

## 2011-11-26 MED ORDER — METOCLOPRAMIDE HCL 5 MG/ML IJ SOLN: 10.0000 mg | Freq: Once | INTRAMUSCULAR | Status: DC | PRN

## 2011-11-26 MED ORDER — MIDAZOLAM HCL 5 MG/5ML IJ SOLN
INTRAMUSCULAR | Status: DC | PRN
  Administered 2011-11-26: 2 mg via INTRAVENOUS

## 2011-11-26 MED ORDER — MAGNESIUM SULFATE 40 MG/ML IJ SOLN
2.0000 g | Freq: Once | INTRAMUSCULAR | Status: AC | PRN
  Filled 2011-11-26: qty 50

## 2011-11-26 MED ORDER — HEPARIN SODIUM (PORCINE) 1000 UNIT/ML IJ SOLN
INTRAMUSCULAR | Status: DC | PRN
  Administered 2011-11-26: 8000 [IU] via INTRAVENOUS

## 2011-11-26 MED ORDER — PHENYLEPHRINE HCL 10 MG/ML IJ SOLN
INTRAMUSCULAR | Status: DC | PRN
  Administered 2011-11-26: 40 ug via INTRAVENOUS
  Administered 2011-11-26: 80 ug via INTRAVENOUS
  Administered 2011-11-26: 40 ug via INTRAVENOUS

## 2011-11-26 MED ORDER — METOPROLOL TARTRATE 25 MG PO TABS
25.0000 mg | ORAL_TABLET | Freq: Two times a day (BID) | ORAL | Status: DC
  Administered 2011-11-26 – 2011-11-27 (×2): 25 mg via ORAL
  Filled 2011-11-26 (×3): qty 1

## 2011-11-26 MED ORDER — ASPIRIN EC 81 MG PO TBEC
81.0000 mg | DELAYED_RELEASE_TABLET | Freq: Every day | ORAL | Status: DC
  Administered 2011-11-27: 81 mg via ORAL
  Filled 2011-11-26: qty 1

## 2011-11-26 MED ORDER — OXYCODONE HCL 5 MG PO TABS: 5.0000 mg | ORAL_TABLET | Freq: Once | ORAL | Status: DC | PRN

## 2011-11-26 MED ORDER — DOPAMINE-DEXTROSE 3.2-5 MG/ML-% IV SOLN: 3.0000 ug/kg/min | INTRAVENOUS | Status: DC

## 2011-11-26 MED ORDER — HETASTARCH-ELECTROLYTES 6 % IV SOLN
INTRAVENOUS | Status: AC
  Administered 2011-11-26: 500 mL via INTRAVENOUS
  Filled 2011-11-26: qty 500

## 2011-11-26 MED ORDER — ATROPINE SULFATE 0.4 MG/ML IJ SOLN
INTRAMUSCULAR | Status: DC | PRN
  Administered 2011-11-26 (×2): 0.2 mg via INTRAVENOUS

## 2011-11-26 MED ORDER — ONDANSETRON HCL 4 MG/2ML IJ SOLN: 4.0000 mg | Freq: Four times a day (QID) | INTRAMUSCULAR | Status: DC | PRN

## 2011-11-26 MED ORDER — LIDOCAINE HCL (CARDIAC) 20 MG/ML IV SOLN
INTRAVENOUS | Status: DC | PRN
  Administered 2011-11-26: 60 mg via INTRAVENOUS

## 2011-11-26 MED ORDER — NEOSTIGMINE METHYLSULFATE 1 MG/ML IJ SOLN
INTRAMUSCULAR | Status: DC | PRN
  Administered 2011-11-26: 4 mg via INTRAVENOUS

## 2011-11-26 MED ORDER — LACTATED RINGERS IV SOLN
INTRAVENOUS | Status: DC
  Administered 2011-11-26: 09:00:00 via INTRAVENOUS

## 2011-11-26 MED ORDER — DEXTROSE 5 % IV SOLN
1.5000 g | Freq: Two times a day (BID) | INTRAVENOUS | Status: DC
  Administered 2011-11-26: 1.5 g via INTRAVENOUS
  Filled 2011-11-26 (×3): qty 1.5

## 2011-11-26 MED ORDER — PHENOL 1.4 % MT LIQD: 1.0000 | OROMUCOSAL | Status: DC | PRN

## 2011-11-26 MED ORDER — INSULIN ASPART 100 UNIT/ML ~~LOC~~ SOLN: 0.0000 [IU] | Freq: Three times a day (TID) | SUBCUTANEOUS | Status: DC

## 2011-11-26 MED ORDER — ALUM & MAG HYDROXIDE-SIMETH 200-200-20 MG/5ML PO SUSP: 15.0000 mL | ORAL | Status: DC | PRN

## 2011-11-26 MED ORDER — HYDRALAZINE HCL 20 MG/ML IJ SOLN: 10.0000 mg | INTRAMUSCULAR | Status: DC | PRN

## 2011-11-26 MED ORDER — 0.9 % SODIUM CHLORIDE (POUR BTL) OPTIME
TOPICAL | Status: DC | PRN
  Administered 2011-11-26: 2000 mL

## 2011-11-26 MED ORDER — PROPOFOL 10 MG/ML IV BOLUS
INTRAVENOUS | Status: DC | PRN
  Administered 2011-11-26: 200 mg via INTRAVENOUS
  Administered 2011-11-26: 30 mg via INTRAVENOUS

## 2011-11-26 MED ORDER — HYDROMORPHONE HCL PF 1 MG/ML IJ SOLN: 0.2500 mg | INTRAMUSCULAR | Status: DC | PRN

## 2011-11-26 MED ORDER — METOPROLOL TARTRATE 1 MG/ML IV SOLN: 2.0000 mg | INTRAVENOUS | Status: DC | PRN

## 2011-11-26 MED ORDER — ACETAMINOPHEN 650 MG RE SUPP: 325.0000 mg | RECTAL | Status: DC | PRN

## 2011-11-26 MED ORDER — OXYCODONE HCL 5 MG/5ML PO SOLN: 5.0000 mg | Freq: Once | ORAL | Status: DC | PRN

## 2011-11-26 MED ORDER — DOCUSATE SODIUM 100 MG PO CAPS
100.0000 mg | ORAL_CAPSULE | Freq: Every day | ORAL | Status: DC
  Administered 2011-11-27: 100 mg via ORAL
  Filled 2011-11-26: qty 1

## 2011-11-26 MED ORDER — CLOPIDOGREL BISULFATE 75 MG PO TABS
75.0000 mg | ORAL_TABLET | Freq: Every day | ORAL | Status: DC
  Administered 2011-11-27: 75 mg via ORAL
  Filled 2011-11-26: qty 1

## 2011-11-26 MED ORDER — GUAIFENESIN-DM 100-10 MG/5ML PO SYRP: 15.0000 mL | ORAL_SOLUTION | ORAL | Status: DC | PRN

## 2011-11-26 MED ORDER — HETASTARCH-ELECTROLYTES 6 % IV SOLN
500.0000 mL | Freq: Once | INTRAVENOUS | Status: AC
  Administered 2011-11-26: 500 mL via INTRAVENOUS

## 2011-11-26 MED ORDER — LISINOPRIL 20 MG PO TABS
20.0000 mg | ORAL_TABLET | Freq: Every day | ORAL | Status: DC
  Administered 2011-11-27: 20 mg via ORAL
  Filled 2011-11-26: qty 1

## 2011-11-26 MED ORDER — EPHEDRINE SULFATE 50 MG/ML IJ SOLN
INTRAMUSCULAR | Status: DC | PRN
  Administered 2011-11-26: 5 mg via INTRAVENOUS
  Administered 2011-11-26: 10 mg via INTRAVENOUS
  Administered 2011-11-26: 5 mg via INTRAVENOUS

## 2011-11-26 MED ORDER — ACETAMINOPHEN 325 MG PO TABS: 325.0000 mg | ORAL_TABLET | ORAL | Status: DC | PRN

## 2011-11-26 MED ORDER — IPRATROPIUM-ALBUTEROL 20-100 MCG/ACT IN AERS
2.0000 | INHALATION_SPRAY | Freq: Four times a day (QID) | RESPIRATORY_TRACT | Status: DC | PRN
  Administered 2011-11-27: 2 via RESPIRATORY_TRACT
  Filled 2011-11-26: qty 4

## 2011-11-26 MED ORDER — VECURONIUM BROMIDE 10 MG IV SOLR
INTRAVENOUS | Status: DC | PRN
  Administered 2011-11-26: 10 mg via INTRAVENOUS

## 2011-11-26 MED ORDER — ALPRAZOLAM 0.5 MG PO TABS
0.5000 mg | ORAL_TABLET | ORAL | Status: DC | PRN
  Administered 2011-11-26 – 2011-11-27 (×2): 0.5 mg via ORAL
  Filled 2011-11-26 (×2): qty 1

## 2011-11-26 MED ORDER — ARTIFICIAL TEARS OP OINT
TOPICAL_OINTMENT | OPHTHALMIC | Status: DC | PRN
  Administered 2011-11-26: 1 via OPHTHALMIC

## 2011-11-26 MED ORDER — POTASSIUM CHLORIDE CRYS ER 20 MEQ PO TBCR: 20.0000 meq | EXTENDED_RELEASE_TABLET | Freq: Once | ORAL | Status: AC | PRN

## 2011-11-26 MED ORDER — FENTANYL CITRATE 0.05 MG/ML IJ SOLN
INTRAMUSCULAR | Status: DC | PRN
  Administered 2011-11-26 (×3): 50 ug via INTRAVENOUS
  Administered 2011-11-26: 100 ug via INTRAVENOUS

## 2011-11-26 MED ORDER — SODIUM CHLORIDE 0.9 % IV SOLN: INTRAVENOUS | Status: DC

## 2011-11-26 MED ORDER — LABETALOL HCL 5 MG/ML IV SOLN: 10.0000 mg | INTRAVENOUS | Status: DC | PRN

## 2011-11-26 MED ORDER — SIMVASTATIN 10 MG PO TABS
10.0000 mg | ORAL_TABLET | Freq: Every day | ORAL | Status: DC
  Filled 2011-11-26: qty 1

## 2011-11-26 MED ORDER — PROTAMINE SULFATE 10 MG/ML IV SOLN
INTRAVENOUS | Status: DC | PRN
  Administered 2011-11-26 (×5): 10 mg via INTRAVENOUS

## 2011-11-26 MED ORDER — MOMETASONE FURO-FORMOTEROL FUM 100-5 MCG/ACT IN AERO
2.0000 | INHALATION_SPRAY | Freq: Two times a day (BID) | RESPIRATORY_TRACT | Status: DC
  Administered 2011-11-26 – 2011-11-27 (×2): 2 via RESPIRATORY_TRACT
  Filled 2011-11-26: qty 8.8

## 2011-11-26 MED ORDER — SODIUM CHLORIDE 0.9 % IV SOLN
INTRAVENOUS | Status: DC
  Administered 2011-11-26 – 2011-11-27 (×2): via INTRAVENOUS

## 2011-11-26 MED ORDER — SODIUM CHLORIDE 0.9 % IV SOLN: 500.0000 mL | Freq: Once | INTRAVENOUS | Status: AC | PRN

## 2011-11-26 MED ORDER — TRAMADOL HCL 50 MG PO TABS
50.0000 mg | ORAL_TABLET | Freq: Four times a day (QID) | ORAL | Status: DC | PRN
  Administered 2011-11-26: 50 mg via ORAL
  Filled 2011-11-26: qty 1

## 2011-11-26 MED ORDER — SODIUM CHLORIDE 0.9 % IR SOLN
Status: DC | PRN
  Administered 2011-11-26: 12:00:00

## 2011-11-26 MED ORDER — HYDROCHLOROTHIAZIDE 25 MG PO TABS
25.0000 mg | ORAL_TABLET | Freq: Every day | ORAL | Status: DC
  Administered 2011-11-27: 25 mg via ORAL
  Filled 2011-11-26: qty 1

## 2011-11-26 MED ORDER — MORPHINE SULFATE 2 MG/ML IJ SOLN
2.0000 mg | INTRAMUSCULAR | Status: DC | PRN
  Administered 2011-11-26: 3 mg via INTRAVENOUS
  Administered 2011-11-26: 2 mg via INTRAVENOUS
  Administered 2011-11-27 (×2): 3 mg via INTRAVENOUS
  Filled 2011-11-26: qty 1
  Filled 2011-11-26 (×3): qty 2

## 2011-11-26 MED ORDER — GLYCOPYRROLATE 0.2 MG/ML IJ SOLN
INTRAMUSCULAR | Status: DC | PRN
  Administered 2011-11-26: .6 mg via INTRAVENOUS

## 2011-11-26 SURGICAL SUPPLY — 56 items
APL SKNCLS STERI-STRIP NONHPOA (GAUZE/BANDAGES/DRESSINGS) ×2
BANDAGE ESMARK 6X9 LF (GAUZE/BANDAGES/DRESSINGS) IMPLANT
BENZOIN TINCTURE PRP APPL 2/3 (GAUZE/BANDAGES/DRESSINGS) ×3 IMPLANT
BNDG CMPR 9X6 STRL LF SNTH (GAUZE/BANDAGES/DRESSINGS)
BNDG ESMARK 6X9 LF (GAUZE/BANDAGES/DRESSINGS)
CANISTER SUCTION 2500CC (MISCELLANEOUS) ×3 IMPLANT
CLIP LIGATING EXTRA MED SLVR (CLIP) ×3 IMPLANT
CLIP LIGATING EXTRA SM BLUE (MISCELLANEOUS) ×3 IMPLANT
CLOTH BEACON ORANGE TIMEOUT ST (SAFETY) ×3 IMPLANT
CLSR STERI-STRIP ANTIMIC 1/2X4 (GAUZE/BANDAGES/DRESSINGS) ×2 IMPLANT
COVER SURGICAL LIGHT HANDLE (MISCELLANEOUS) ×3 IMPLANT
CUFF TOURNIQUET SINGLE 18IN (TOURNIQUET CUFF) IMPLANT
CUFF TOURNIQUET SINGLE 24IN (TOURNIQUET CUFF) IMPLANT
CUFF TOURNIQUET SINGLE 34IN LL (TOURNIQUET CUFF) IMPLANT
CUFF TOURNIQUET SINGLE 44IN (TOURNIQUET CUFF) IMPLANT
DRAIN SNY 10X20 3/4 PERF (WOUND CARE) IMPLANT
DRAPE WARM FLUID 44X44 (DRAPE) ×3 IMPLANT
DRAPE X-RAY CASS 24X20 (DRAPES) IMPLANT
DRSG COVADERM 4X6 (GAUZE/BANDAGES/DRESSINGS) ×2 IMPLANT
DRSG COVADERM 4X8 (GAUZE/BANDAGES/DRESSINGS) IMPLANT
ELECT REM PT RETURN 9FT ADLT (ELECTROSURGICAL) ×3
ELECTRODE REM PT RTRN 9FT ADLT (ELECTROSURGICAL) ×2 IMPLANT
EVACUATOR SILICONE 100CC (DRAIN) IMPLANT
GLOVE BIO SURGEON STRL SZ 6.5 (GLOVE) ×6 IMPLANT
GLOVE BIOGEL PI IND STRL 6.5 (GLOVE) ×2 IMPLANT
GLOVE BIOGEL PI IND STRL 7.0 (GLOVE) ×3 IMPLANT
GLOVE BIOGEL PI INDICATOR 6.5 (GLOVE) ×2
GLOVE BIOGEL PI INDICATOR 7.0 (GLOVE) ×3
GLOVE SS BIOGEL STRL SZ 7.5 (GLOVE) ×2 IMPLANT
GLOVE SUPERSENSE BIOGEL SZ 7.5 (GLOVE) ×1
GLOVE SURG SS PI 6.0 STRL IVOR (GLOVE) ×2 IMPLANT
GLOVE SURG SS PI 6.5 STRL IVOR (GLOVE) ×2 IMPLANT
GOWN STRL NON-REIN LRG LVL3 (GOWN DISPOSABLE) ×13 IMPLANT
KIT BASIN OR (CUSTOM PROCEDURE TRAY) ×3 IMPLANT
KIT ROOM TURNOVER OR (KITS) ×3 IMPLANT
NS IRRIG 1000ML POUR BTL (IV SOLUTION) ×6 IMPLANT
PACK PERIPHERAL VASCULAR (CUSTOM PROCEDURE TRAY) ×3 IMPLANT
PAD ARMBOARD 7.5X6 YLW CONV (MISCELLANEOUS) ×6 IMPLANT
PADDING CAST COTTON 6X4 STRL (CAST SUPPLIES) IMPLANT
PATCH HEMASHIELD 8X75 (Vascular Products) ×2 IMPLANT
SET COLLECT BLD 21X3/4 12 (NEEDLE) IMPLANT
STAPLER VISISTAT 35W (STAPLE) IMPLANT
STOPCOCK 4 WAY LG BORE MALE ST (IV SETS) IMPLANT
STRIP CLOSURE SKIN 1/2X4 (GAUZE/BANDAGES/DRESSINGS) ×3 IMPLANT
SUT ETHILON 3 0 PS 1 (SUTURE) IMPLANT
SUT PROLENE 5 0 C 1 24 (SUTURE) IMPLANT
SUT PROLENE 6 0 CC (SUTURE) ×12 IMPLANT
SUT VIC AB 2-0 CTX 36 (SUTURE) ×3 IMPLANT
SUT VIC AB 3-0 SH 27 (SUTURE) ×3
SUT VIC AB 3-0 SH 27X BRD (SUTURE) ×2 IMPLANT
TOWEL OR 17X24 6PK STRL BLUE (TOWEL DISPOSABLE) ×6 IMPLANT
TOWEL OR 17X26 10 PK STRL BLUE (TOWEL DISPOSABLE) ×6 IMPLANT
TRAY FOLEY CATH 14FRSI W/METER (CATHETERS) ×3 IMPLANT
TUBING EXTENTION W/L.L. (IV SETS) IMPLANT
UNDERPAD 30X30 INCONTINENT (UNDERPADS AND DIAPERS) ×3 IMPLANT
WATER STERILE IRR 1000ML POUR (IV SOLUTION) ×3 IMPLANT

## 2011-11-26 NOTE — H&P (View-Only) (Signed)
Shaun Schmitt   09/25/2011 2:30 PM Office Visit  MRN: [id number]   Description: 61 year old male  Provider: Alekzander Cardell, MD  Department: [facility]        Diagnoses     PVD (peripheral vascular disease)   - Primary    443.9      Reason for Visit     New Evaluation    CHECK PAIN IN B/L LOWER EXTREMITIES, NUMBNESS        Vitals - Last Recorded       BP Pulse Resp Ht Wt BMI    107/67 98 20 6' (1.829 m) 182 lb (82.555 kg) 24.68 kg/m2       Progress Notes     Shaun Hannig, MD  09/25/2011  5:29 PM  Signed [facility] [place]     Patient name: Shaun Schmitt           MRN: [id number]        DOB: 1950-02-18        Sex: male     Referred by: Leonia Reader   Reason for referral:  Chief Complaint   Patient presents with   .  New Evaluation       CHECK PAIN IN B/L LOWER EXTREMITIES, NUMBNESS      HISTORY OF PRESENT ILLNESS: Patient is a 61 year old gentleman who presents today with severe bilateral lower extremity claudication symptoms. He has very complex past history. He did undergo a right to left fem-fem bypass and [place] an approximately 10 years ago. He also had right renal artery stenting during the same time.Marland Kitchen He does now with multiple complaints of his lower Shoney's. He has very limited walking ability and reports that he has aching and cramping cannot continue to walk. His mainly his claudication type symptoms but he also has numbness in both feet. He does have a history of 3 problems with the left hip surgery. He has a significant history of coronary artery disease as well. Fortunately despite all this he does continue to have half pack per day cigarette smoking.    Past Medical History   Diagnosis  Date   .  Pain in both knees         Right worse , bilateral Hips and ankles, numbness    .  Arthritis         Gout   .  Myocardial infarction         X's 2   .  Carotid artery occlusion     .  COPD (chronic obstructive  pulmonary disease)     .  Emphysema     .  Hypertension     .  Renal artery stenosis  12/07/2002       Stents       Past Surgical History   Procedure  Date   .  Joint replacement  2005       Right Hip   .  Pr vein bypass graft,aorto-fem-pop     .  Knee surgery         History       Social History   .  Marital Status:  Married       Spouse Name:  N/A       Number of Children:  N/A   .  Years of Education:  N/A       Occupational History   .  Not on file.       Social  History Main Topics   .  Smoking status:  Current Every Day Smoker -- 0.5 packs/day for 40 years       Types:  Cigarettes   .  Smokeless tobacco:  Current User   .  Alcohol Use:  No   .  Drug Use:  No   .  Sexually Active:  Not on file       Other Topics  Concern   .  Not on file       Social History Narrative   .  No narrative on file       Family History   Problem  Relation  Age of Onset   .  Emphysema  Mother     .  Hypertension  Mother     .  Emphysema  Father     .  Hypertension  Father         Allergies as of 09/25/2011 - Review Complete 09/25/2011   Allergen  Reaction  Noted   .  Codeine    09/17/2011       Current Outpatient Prescriptions on File Prior to Visit   Medication  Sig  Dispense  Refill   .  aspirin 81 MG tablet  Take 81 mg by mouth daily.         .  clopidogrel (PLAVIX) 75 MG tablet  Take 75 mg by mouth daily.         .  Fluticasone-Salmeterol (ADVAIR DISKUS) 100-50 MCG/DOSE AEPB  Inhale 1 puff into the lungs every 12 (twelve) hours.         .  hydrochlorothiazide (HYDRODIURIL) 25 MG tablet  Take 25 mg by mouth daily.         .  Ipratropium-Albuterol (COMBIVENT RESPIMAT) 20-100 MCG/ACT AERS respimat  Inhale into the lungs every 6 (six) hours.         Marland Kitchen  lisinopril (PRINIVIL,ZESTRIL) 20 MG tablet  Take 20 mg by mouth daily.         .  metoprolol succinate (TOPROL-XL) 100 MG 24 hr tablet  Take 100 mg by mouth daily. Take with or immediately following a meal.         .   pravastatin (PRAVACHOL) 20 MG tablet  Take 20 mg by mouth daily.         .  furosemide (LASIX) 20 MG tablet  Take 20 mg by mouth 2 (two) times daily.              REVIEW OF SYSTEMS:   Positives indicated with an "X"   CARDIOVASCULAR:  [ ]  chest pain   [ ]  chest pressure   [ ]  palpitations   [ ]  orthopnea               [x ] dyspnea on exertion   [x ] claudication   [ ]  rest pain   [ ]  DVT   [ ]  phlebitis PULMONARY:   [ ]  productive cough   [ ]  asthma   [ ]  wheezing NEUROLOGIC:   [x ] weakness  [ x] paresthesias  [ ]  aphasia  [ ]  amaurosis  [ ]  dizziness HEMATOLOGIC:   [ ]  bleeding problems   [ ]  clotting disorders MUSCULOSKELETAL:  [ ]  joint pain   [ ]  joint swelling GASTROINTESTINAL: [ ]   blood in stool  [ ]   hematemesis GENITOURINARY:  [ ]   dysuria  [ ]   hematuria PSYCHIATRIC:  [ ]  history of major depression  INTEGUMENTARY:  [ ]  rashes  [ ]  ulcers CONSTITUTIONAL:  [ ]  fever   [ ]  chills   PHYSICAL EXAMINATION:   General: The patient is a well-nourished male, in no acute distress. Vital signs are BP 107/67  Pulse 98  Resp 20  Ht 6' (1.829 m)  Wt 182 lb (82.555 kg)  BMI 24.68 kg/m2 Pulmonary: There is a good air exchange bilaterally without wheezing or rales. Abdomen: Soft and non-tender with normal pitch bowel sounds. Musculoskeletal: There are no major deformities.  There is no significant extremity pain. Neurologic: No focal weakness or paresthesias are detected, Skin: There are no ulcer or rashes noted. Psychiatric: The patient has normal affect. Cardiovascular: There is a regular rate and rhythm without significant murmur appreciated. Pulse status is noted for a diminished 1+ right femoral pulse. There is absent left femoral pulse. I do not palpate a femoral-femoral graft pulse. Carotid arteries a bruits bilaterally   [facility] Lab Studies:   Ordered and Independently Reviewed   Index of 0.5 bilaterally. Imaging reveals occlusion of his left external and  common iliac artery. There is a high-grade stenosis above his right to left fem-fem bypass in his external iliac artery.   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 I discussed this at length with the patient and his wife present. I do feel there also neurologic changes resulting in some of the resting symptoms that he had. He is severely limited by this and I have recommended arteriography for further evaluation. I explained that he may be a candidate for stenting of his external iliac lesion but also may require surgical treatment of this.   He also has a history of significant carotid disease with poor documentation this. He reports been several years since he had a carotid duplex. I explained we would obtain this at some point during this evaluation to further clarify this. We will plan a elective outpatient arteriography and possible angioplasty if his convenience       Shaun Schmitt [facility] [place] Office: [phone]            Addendum:  The patient has been re-examined and re-evaluated.  The patient's history and physical has been reviewed and is unchanged.    Shaun Schmitt is a 61 y.o. male is being admitted with PVD. All the risks, benefits and other treatment options have been discussed with the patient. The patient has consented to proceed with Procedure(s): ABDOMINAL AORTAGRAM as a surgical intervention.  Shaun Schmitt 10/31/2011 11:45 AM [facility]

## 2011-11-26 NOTE — Interval H&P Note (Signed)
History and Physical Interval Note:  11/26/2011 7:22 AM  Shaun Schmitt  has presented today for surgery, with the diagnosis of CLAUDICATION  The various methods of treatment have been discussed with the patient and family. After consideration of risks, benefits and other options for treatment, the patient has consented to  Procedure(s) (LRB) with comments: ENDARTERECTOMY ILIAC (Right) - WITH PATCH as a surgical intervention .  The patient's history has been reviewed, patient examined, no change in status, stable for surgery.  I have reviewed the patient's chart and labs.  Questions were answered to the patient's satisfaction.     Kaitlynne Wenz

## 2011-11-26 NOTE — Transfer of Care (Signed)
Immediate Anesthesia Transfer of Care Note  Patient: Shaun Schmitt  Procedure(s) Performed: Procedure(s) (LRB) with comments: PATCH ANGIOPLASTY (Right) - Using Hemashield 0.8cm x 7.6 cm vascular patch. REPAIR ILIAC ARTERY (Right) - With patch angioplasty.  Patient Location: PACU  Anesthesia Type:General  Level of Consciousness: awake, alert  and oriented  Airway & Oxygen Therapy: Patient Spontanous Breathing and Patient connected to nasal cannula oxygen  Post-op Assessment: Report given to PACU RN, Post -op Vital signs reviewed and stable and Patient moving all extremities X 4  Post vital signs: Reviewed and stable  Complications: No apparent anesthesia complications

## 2011-11-26 NOTE — Anesthesia Preprocedure Evaluation (Addendum)
Anesthesia Evaluation  Patient identified by MRN, date of birth, ID band Patient awake    Reviewed: Allergy & Precautions, H&P , NPO status , Patient's Chart, lab work & pertinent test results, reviewed documented beta blocker date and time   Airway Mallampati: II TM Distance: >3 FB Neck ROM: full    Dental  (+) Teeth Intact and Dental Advidsory Given   Pulmonary COPD COPD inhaler,  breath sounds clear to auscultation        Cardiovascular hypertension, On Medications and On Home Beta Blockers + CAD and + Past MI Rhythm:regular     Neuro/Psych negative neurological ROS  negative psych ROS   GI/Hepatic negative GI ROS, Neg liver ROS,   Endo/Other  negative endocrine ROS  Renal/GU Renal disease  negative genitourinary   Musculoskeletal   Abdominal   Peds  Hematology negative hematology ROS (+)   Anesthesia Other Findings See surgeon's H&P   Reproductive/Obstetrics negative OB ROS                          Anesthesia Physical Anesthesia Plan  ASA: III  Anesthesia Plan: General   Post-op Pain Management:    Induction: Intravenous  Airway Management Planned: Oral ETT  Additional Equipment:   Intra-op Plan:   Post-operative Plan: Extubation in OR  Informed Consent: I have reviewed the patients History and Physical, chart, labs and discussed the procedure including the risks, benefits and alternatives for the proposed anesthesia with the patient or authorized representative who has indicated his/her understanding and acceptance.   Dental Advisory Given  Plan Discussed with: CRNA, Surgeon and Anesthesiologist  Anesthesia Plan Comments:        Anesthesia Quick Evaluation

## 2011-11-26 NOTE — Anesthesia Postprocedure Evaluation (Signed)
Anesthesia Post Note  Patient: Shaun Schmitt  Procedure(s) Performed: Procedure(s) (LRB): PATCH ANGIOPLASTY (Right) REPAIR ILIAC ARTERY (Right)  Anesthesia type: general  Patient location: PACU  Post pain: Pain level controlled  Post assessment: Patient's Cardiovascular Status Stable  Last Vitals:  Filed Vitals:   11/26/11 1500  BP:   Pulse: 81  Temp:   Resp: 15    Post vital signs: Reviewed and stable  Level of consciousness: sedated  Complications: No apparent anesthesia complications

## 2011-11-26 NOTE — Op Note (Signed)
OPERATIVE REPORT  DATE OF SURGERY: 11/26/2011  PATIENT: Shaun Schmitt, 61 y.o. male MRN: [id number]  DOB: 08/08/1950  PRE-OPERATIVE DIAGNOSIS: Lower extremity claudication and arterial insufficiency with right external iliac stenosis  POST-OPERATIVE DIAGNOSIS:  Same  PROCEDURE: Right external iliac artery patch angioplasty  SURGEON:  Gretta Began, M.D.  PHYSICIAN ASSISTANT: Roczniak  ANESTHESIA:  Gen.  EBL: 809 ml  Total I/O In: 1000 [I.V.:1000] Out: 800 [Blood:800]  BLOOD ADMINISTERED: None  DRAINS: None  SPECIMEN: None PACU  COUNTS CORRECT:  YES  PLAN OF CARE: PACU   PATIENT DISPOSITION:  PACU - hemodynamically stable  PROCEDURE DETAILS: The patient is status post right to left fem-fem bypass in [facility]. He has suffered recurrent bilateral to totally claudication symptoms. Preoperative arteriogram revealed critical stenosis of his distal external iliac artery above the inguinal ligament. A complete occlusion of his left iliac system. The patient did have significant disease throughout the right iliac system that this was most focal stenosis. It was too low for balloon angioplasty and stenting. Recommended he undergo surgical revision of this area.  The patient was taken to the operating room where the area the right groin right leg were prepped in the usual fashion. An incision was made through the old groin scar and carried down to isolate the common femoral artery above the level of the femoral to femoral crossover graft. This was of ringed Gore-Tex. It was relatively low takeoff of the anastomosis. The external iliac artery was exposed up under the inguinal ligament. Was extensively calcified. There was a thrill in the artery the 2 the severe stenosis. The crossing vein and the external iliac artery was ligated and divided for better mobilization. The tissue was given 8000 units of intravenous heparin. After adequate circulation time the external iliac was  occluded above the level of stenosis with a Hanley clamp. The common femoral artery was occluded with a Gregory clamp. The artery was opened with an 11 blade and extended longitudinally across the stenosis. The 3 through the Hasson 4 dilator were passed through the external iliac artery above this area. This artery was extensively circumferentially calcified. A Finesse patch was brought onto the field. Decision was made not to endarterectomize censored be a very difficult time obtaining a proximal distal endpoint. Dacron patch angioplasty when air open the area of stenosis of for adequate flow through this area. The Dacron patch was sewn with a running 6-0 Prolene suture. Prior to completion of the anastomosis the usual flushing maneuvers were undertaken anastomosis completed. There was an area of leak from the external iliac above the clamp. Is either small but branch or clamp injury. This was controlled with a 60 figure-of-eight suture. The patient was given 50 mg of protamine to reverse the heparin. Wound irrigated with saline hemostasis obtained with cautery. The wounds closed with 2-0 Vicryl in several layers and subcutaneous tissue. The skin was closed with subcuticular reapproximated. Sterile dressing was applied and the patient was taken to the recovery in stable condition. Should be noted that the fem-fem graft had a good Doppler flow through it at the end of the procedure.   Gretta Began, M.D. 11/26/2011 1:31 PM

## 2011-11-26 NOTE — Anesthesia Procedure Notes (Addendum)
Performed by: Bronson Ing F   Procedure Name: Intubation Date/Time: 11/26/2011 11:18 AM Performed by: Carmela Rima Pre-anesthesia Checklist: Patient identified, Timeout performed, Emergency Drugs available, Suction available and Patient being monitored Patient Re-evaluated:Patient Re-evaluated prior to inductionOxygen Delivery Method: Circle system utilized Preoxygenation: Pre-oxygenation with 100% oxygen Intubation Type: IV induction Ventilation: Mask ventilation without difficulty Laryngoscope Size: Mac and 3 Grade View: Grade II Tube type: Oral Tube size: 7.5 mm Number of attempts: 1 Airway Equipment and Method: Stylet Placement Confirmation: breath sounds checked- equal and bilateral,  positive ETCO2 and ETT inserted through vocal cords under direct vision Secured at: 23 cm Tube secured with: Tape Dental Injury: Teeth and Oropharynx as per pre-operative assessment

## 2011-11-26 NOTE — Progress Notes (Signed)
Pt admitted from PACU, oriented to unit and routines, pain management and safety discussed, wife at bedside, both verbalize understanding

## 2011-11-26 NOTE — Preoperative (Signed)
Beta Blockers   Reason not to administer Beta Blockers:Not Applicable 

## 2011-11-27 ENCOUNTER — Encounter (HOSPITAL_COMMUNITY): Payer: Self-pay | Admitting: Vascular Surgery

## 2011-11-27 LAB — CBC
HCT: 25.7 % — ABNORMAL LOW (ref 39.0–52.0)
Hemoglobin: 8.2 g/dL — ABNORMAL LOW (ref 13.0–17.0)
MCH: 29.3 pg (ref 26.0–34.0)
MCHC: 31.9 g/dL (ref 30.0–36.0)
MCV: 91.8 fL (ref 78.0–100.0)

## 2011-11-27 LAB — BASIC METABOLIC PANEL
BUN: 12 mg/dL (ref 6–23)
CO2: 27 mEq/L (ref 19–32)
Calcium: 8.3 mg/dL — ABNORMAL LOW (ref 8.4–10.5)
Creatinine, Ser: 1.08 mg/dL (ref 0.50–1.35)
GFR calc non Af Amer: 74 mL/min — ABNORMAL LOW (ref >90)
Glucose, Bld: 100 mg/dL — ABNORMAL HIGH (ref 70–99)
Sodium: 135 mEq/L (ref 135–145)

## 2011-11-27 LAB — GLUCOSE, CAPILLARY: Glucose-Capillary: 100 mg/dL — ABNORMAL HIGH (ref 70–99)

## 2011-11-27 MED ORDER — TRAMADOL HCL 50 MG PO TABS: 50.0000 mg | ORAL_TABLET | Freq: Four times a day (QID) | ORAL | Status: DC | PRN

## 2011-11-27 NOTE — Progress Notes (Signed)
Utilization review completed.  

## 2011-11-27 NOTE — Progress Notes (Signed)
Pt discharged via wheelchair home with wife and belongings. Discharge instructions explained and discussed with patient and wife. Prescriptions, f/u appt. Given to pt. No complaints of pain at this time. Beryle Quant

## 2011-11-27 NOTE — Progress Notes (Signed)
Subjective: Interval History: none.. was unable to void in PACU. Had a Foley placed. Catheter out at 4 AM. The patient has walks also without difficulty.  Objective: Vital signs in last 24 hours: Temp:  [97.1 F (36.2 C)-98.1 F (36.7 C)] 97.9 F (36.6 C) (11/19 0405) Pulse Rate:  [67-97] 76  (11/19 0405) Resp:  [11-19] 12  (11/19 0405) BP: (97-144)/(48-72) 110/61 mmHg (11/19 0405) SpO2:  [98 %-100 %] 100 % (11/19 0405) Weight:  [182 lb 1.6 oz (82.6 kg)] 182 lb 1.6 oz (82.6 kg) (11/18 1630)  Intake/Output from previous day: 11/18 0701 - 11/19 0700 In: 4085 [P.O.:360; I.V.:3175; IV Piggyback:550] Out: 3200 [Urine:2000; Blood:800] Intake/Output this shift:    Groin wound healing nicely. Dressing removed. Steri-Strips intact. Good Doppler signal in both the dorsalis pedis pulses.  Lab Results:  [facility] 11/27/11 0450  WBC 7.4  HGB 8.2*  HCT 25.7*  PLT 113*   BMET  Basename 11/27/11 0450  NA 135  K 4.2  CL 102  CO2 27  GLUCOSE 100*  BUN 12  CREATININE 1.08  CALCIUM 8.3*    Studies/Results: Dg Chest 2 View  11/19/2011  *RADIOLOGY REPORT*  Clinical Data: COPD and hypertension  CHEST - 2 VIEW  Comparison: 07/21/2011  Findings: Hyperinflation is again seen.  No focal infiltrate or sizable effusion is noted.  Cardiac shadow is stable.  No acute bony abnormality is seen.  IMPRESSION: COPD without acute abnormality.   Original Report Authenticated By: Alcide Clever, M.D.    Anti-infectives: Anti-infectives     Start     Dose/Rate Route Frequency Ordered Stop   11/26/11 2200   cefUROXime (ZINACEF) 1.5 g in dextrose 5 % 50 mL IVPB        1.5 g 100 mL/hr over 30 Minutes Intravenous Every 12 hours 11/26/11 1705 11/27/11 2159   11/25/11 1532   cefUROXime (ZINACEF) 1.5 g in dextrose 5 % 50 mL IVPB        1.5 g 100 mL/hr over 30 Minutes Intravenous 30 min pre-op 11/25/11 1532 11/26/11 1127          Assessment/Plan: s/p Procedure(s) (LRB) with comments: PATCH  ANGIOPLASTY (Right) - Using Hemashield 0.8cm x 7.6 cm vascular patch. REPAIR ILIAC ARTERY (Right) - With patch angioplasty. Acute blood loss anemia, asymptomatic. No discharge to home today after patient is able to void spontaneously. Followup in several weeks. I did discuss the significance of his severe diffuse right iliac disease with the fem-fem bypass arising from this. I explained that he may require additional treatment such as aortobifemoral bypass in the future   LOS: 1 day   Johnney Scarlata 11/27/2011, 7:18 AM

## 2011-11-27 NOTE — Discharge Summary (Signed)
[facility] Discharge Summary  Shaun Schmitt May 17, 1950 61 y.o. male  [id number]  Admission Date: 11/26/2011  Discharge Date: 11/27/11  Physician: Larina Earthly, MD  Admission Diagnosis: CLAUDICATION   HPI:   This is a 61 y.o. male who presents today with severe bilateral lower extremity claudication symptoms. He has very complex past history. He did undergo a right to left fem-fem bypass and [place] an approximately 10 years ago. He also had right renal artery stenting during the same time.Marland Kitchen He does now with multiple complaints of his lower Shoney's. He has very limited walking ability and reports that he has aching and cramping cannot continue to walk. His mainly his claudication type symptoms but he also has numbness in both feet. He does have a history of 3 problems with the left hip surgery. He has a significant history of coronary artery disease as well. Fortunately despite all this he does continue to have half pack per day cigarette smoking.  Hospital Course:  The patient was admitted to the hospital and taken to the operating room on 11/26/2011 and underwent Right external iliac artery patch angioplasty.  The pt tolerated the procedure well and was transported to the PACU in good condition. In the PACU, he was having trouble voiding and a foley catheter was placed.  By POD 1, he was doing well and is discharged home.  The remainder of the hospital course consisted of increasing mobilization and increasing intake of solids without difficulty.  CBC    Component Value Date/Time   WBC 7.4 11/27/2011 0450   RBC 2.80* 11/27/2011 0450   HGB 8.2* 11/27/2011 0450   HCT 25.7* 11/27/2011 0450   PLT 113* 11/27/2011 0450   MCV 91.8 11/27/2011 0450   MCH 29.3 11/27/2011 0450   MCHC 31.9 11/27/2011 0450   RDW 15.1 11/27/2011 0450    BMET    Component Value Date/Time   NA 135 11/27/2011 0450   K 4.2 11/27/2011 0450   CL 102 11/27/2011 0450   CO2 27 11/27/2011 0450   GLUCOSE 100* 11/27/2011 0450   BUN 12 11/27/2011 0450   CREATININE 1.08 11/27/2011 0450   CALCIUM 8.3* 11/27/2011 0450   GFRNONAA 74* 11/27/2011 0450   GFRAA 86* 11/27/2011 0450     Discharge Instructions:   The patient is discharged to home with extensive instructions on wound care and progressive ambulation.  They are instructed not to drive or perform any heavy lifting until returning to see the physician in his office.  Discharge Orders    Future Orders Please Complete By Expires   Resume previous diet      Driving Restrictions      Comments:   No driving for 2 weeks   Lifting restrictions      Comments:   No lifting for 6 weeks   Call MD for:  temperature >100.5      Call MD for:  redness, tenderness, or signs of infection (pain, swelling, bleeding, redness, odor or green/yellow discharge around incision site)      Call MD for:  severe or increased pain, loss or decreased feeling  in affected limb(s)      Discharge wound care:      Comments:   Shower daily with soap and water starting 11/28/11      Discharge Diagnosis:  CLAUDICATION  Secondary Diagnosis: Patient Active Problem List  Diagnosis  . PVD (peripheral vascular disease)   Past Medical History  Diagnosis Date  . Pain  in both knees     Right worse , bilateral Hips and ankles, numbness   . Arthritis     Gout  . Carotid artery occlusion   . COPD (chronic obstructive pulmonary disease)   . Emphysema   . Hypertension   . Myocardial infarction     X's 2  10-12 YRS AGO @ [place]  . Renal artery stenosis 12/07/2002    Stents  . Stenosis of ureter of transplanted kidney     RIGHT KIDNEY 'DOESN'T WORK'      Tara, Wich  [facility] Medication Instructions ZOX:[id number]   Printed on:11/27/11 0739  Medication Information                    Fluticasone-Salmeterol (ADVAIR DISKUS) 100-50 MCG/DOSE AEPB Inhale 1 puff into the lungs every 12 (twelve) hours.           aspirin 81 MG  tablet Take 81 mg by mouth daily.           clopidogrel (PLAVIX) 75 MG tablet Take 75 mg by mouth daily.           Ipratropium-Albuterol (COMBIVENT RESPIMAT) 20-100 MCG/ACT AERS respimat Inhale 2 puffs into the lungs every 6 (six) hours as needed. For shortness of breath           hydrochlorothiazide (HYDRODIURIL) 25 MG tablet Take 25 mg by mouth daily.           lisinopril (PRINIVIL,ZESTRIL) 20 MG tablet Take 20 mg by mouth daily.           pravastatin (PRAVACHOL) 20 MG tablet Take 20 mg by mouth daily.           metoprolol tartrate (LOPRESSOR) 25 MG tablet Take 25 mg by mouth 2 (two) times daily.           traMADol (ULTRAM) 50 MG tablet Take 1 tablet (50 mg total) by mouth every 6 (six) hours as needed. For pain #30 NR            Disposition: home  Patient's condition: is Good  Follow up: 1. Dr. Arbie Cookey in 2-3 weeks   Doreatha Massed, PA-C [facility] [phone] 11/27/2011  7:39 AM

## 2011-11-28 ENCOUNTER — Telehealth: Payer: Self-pay | Admitting: Vascular Surgery

## 2011-11-28 NOTE — Telephone Encounter (Addendum)
Message copied by Shari Prows on Wed Nov 28, 2011  1:37 PM ------      Message from: [place], [place] K      Created: Wed Nov 28, 2011 11:49 AM      Regarding: schedule                   ----- Message -----         From: Dara Lords, PA         Sent: 11/27/2011   7:39 AM           To: Sharee Pimple, CMA            S/p Right external iliac artery patch angioplasty      Dr. Arbie Cookey 11/26/11      F/u with him in 2-3 weeks.            Thanks,      Lelon Mast  I scheduled an appt for the above pt on Tues 12/18/11 at 1:15pm. The patient is aware of the appt and I also mailed an appt letter.awt

## 2011-12-17 ENCOUNTER — Encounter: Payer: Self-pay | Admitting: Vascular Surgery

## 2011-12-18 ENCOUNTER — Ambulatory Visit (INDEPENDENT_AMBULATORY_CARE_PROVIDER_SITE_OTHER): Payer: Medicare Other | Admitting: Vascular Surgery

## 2011-12-18 ENCOUNTER — Ambulatory Visit: Payer: Medicare Other | Admitting: Vascular Surgery

## 2011-12-18 ENCOUNTER — Encounter: Payer: Self-pay | Admitting: Vascular Surgery

## 2011-12-18 VITALS — BP 85/66 | HR 82 | Resp 16 | Ht 73.0 in | Wt 178.4 lb

## 2011-12-18 DIAGNOSIS — Z48812 Encounter for surgical aftercare following surgery on the circulatory system: Secondary | ICD-10-CM | POA: Insufficient documentation

## 2011-12-18 NOTE — Progress Notes (Signed)
  Angiogram 10/31/11 Findings #1 patent aorta and right common iliac artery with narrowed right external iliac artery and severe stenosis at the junction of the external iliac and common femoral artery  #2 l occlusion r prior placed right renal artery stents with the more proximal of 2 stents extending far out into the aorta  #3 mild narrowing at the proximal left renal artery  #4 patent right to left fem-fem bypass  #5 right superficial femoral artery occlusion with reconstitution above-knee popliteal artery and three-vessel runoff  #6 patent left superficial femoral artery with three-vessel runoff    Patient is here today for followup of his endarterectomy and patch angioplasty of his right external iliac artery. Surgery was on 11/26/2011. He had a prior right to left fem-fem bypass and [facility] with [place] recurrence stenosis and claudication. His groin wound is completely healed and he is much better walking. He does have good Doppler flow in both feet. He has a known right superficial artery occlusion and patent left superficial femoral artery.  He will continue his walking program we'll see him again in 3 months for continued followup. I did explain the risk of recurrent stenosis of his iliac system. I did explain that he may require an aortobifemoral bypass for definitive treatment he continues to have difficulty with his right iliac donor artery. We will see him again for further discussion in 3 months

## 2012-01-28 ENCOUNTER — Other Ambulatory Visit: Payer: Self-pay | Admitting: *Deleted

## 2012-01-28 DIAGNOSIS — I739 Peripheral vascular disease, unspecified: Secondary | ICD-10-CM

## 2012-01-28 DIAGNOSIS — Z48812 Encounter for surgical aftercare following surgery on the circulatory system: Secondary | ICD-10-CM

## 2012-03-25 ENCOUNTER — Ambulatory Visit: Payer: Medicare Other | Admitting: Vascular Surgery

## 2012-03-25 ENCOUNTER — Ambulatory Visit: Payer: Medicare Other | Admitting: Neurosurgery

## 2012-04-07 ENCOUNTER — Encounter: Payer: Self-pay | Admitting: Vascular Surgery

## 2012-04-08 ENCOUNTER — Ambulatory Visit: Payer: Medicare Other | Admitting: Vascular Surgery

## 2012-04-23 DIAGNOSIS — J40 Bronchitis, not specified as acute or chronic: Secondary | ICD-10-CM

## 2012-04-23 HISTORY — DX: Bronchitis, not specified as acute or chronic: J40

## 2012-05-13 ENCOUNTER — Ambulatory Visit: Payer: Medicare Other | Admitting: Vascular Surgery

## 2012-06-23 ENCOUNTER — Encounter: Payer: Self-pay | Admitting: Vascular Surgery

## 2012-06-24 ENCOUNTER — Encounter: Payer: Self-pay | Admitting: Vascular Surgery

## 2012-06-24 ENCOUNTER — Ambulatory Visit (INDEPENDENT_AMBULATORY_CARE_PROVIDER_SITE_OTHER): Payer: Medicare Other | Admitting: Vascular Surgery

## 2012-06-24 ENCOUNTER — Encounter (INDEPENDENT_AMBULATORY_CARE_PROVIDER_SITE_OTHER): Payer: Medicare Other | Admitting: *Deleted

## 2012-06-24 VITALS — BP 118/62 | HR 58 | Resp 16 | Ht 73.0 in | Wt 172.0 lb

## 2012-06-24 DIAGNOSIS — Z48812 Encounter for surgical aftercare following surgery on the circulatory system: Secondary | ICD-10-CM

## 2012-06-24 DIAGNOSIS — I739 Peripheral vascular disease, unspecified: Secondary | ICD-10-CM

## 2012-06-24 DIAGNOSIS — I6529 Occlusion and stenosis of unspecified carotid artery: Secondary | ICD-10-CM

## 2012-06-24 NOTE — Progress Notes (Signed)
[facility] [place] HISTORY AND PHYSICAL   CC:  6 month f/u appt  Crist Fat, MD  HPI: This is a 62 y.o. male  Who underwent Right external iliac artery patch angioplasty In November 2013.  He has done well since.  He states that he does ambulate better, but his legs give out after walking about a football field length.  He states that he does have numbness in his feet and this is unchanged since November.  He denies any pain.  He did state that he used to have SOB while lying flat, but this has improved since he stopped smoking over a month ago (he now uses E cigarettes).  Past Medical History  Diagnosis Date  . Pain in both knees     Right worse , bilateral Hips and ankles, numbness   . Arthritis     Gout  . Carotid artery occlusion   . COPD (chronic obstructive pulmonary disease)   . Emphysema   . Hypertension   . Myocardial infarction     X's 2  10-12 YRS AGO @ [place]  . Renal artery stenosis 12/07/2002    Stents  . Stenosis of ureter of transplanted kidney     RIGHT KIDNEY 'DOESN'T WORK'  . Bronchitis April 23, 2012   Past Surgical History  Procedure Laterality Date  . Joint replacement  2005    Right Hip  . Pr vein bypass graft,aorto-fem-pop    . Knee surgery    . Cardiac catheterization      cath 05/21/00 ([facility]) showed 50% ostial OM, 25-30% mRCA, EF 40-45%)  . Patch angioplasty  11/26/2011    Procedure: PATCH ANGIOPLASTY;  Surgeon: Larina Earthly, MD;  Location: [facility] OR;  Service: Vascular;  Laterality: Right;  Using Hemashield 0.8cm x 7.6 cm vascular patch.  . Repair iliac artery  11/26/2011    Procedure: REPAIR ILIAC ARTERY;  Surgeon: Larina Earthly, MD;  Location: [facility] OR;  Service: Vascular;  Laterality: Right;  With patch angioplasty.    Allergies  Allergen Reactions  . Codeine     Upset stomach     Current Outpatient Prescriptions  Medication Sig Dispense Refill  . aspirin 81 MG tablet Take 81 mg by mouth daily.      . clonazePAM  (KLONOPIN) 0.5 MG tablet       . clopidogrel (PLAVIX) 75 MG tablet Take 75 mg by mouth daily.      . Fluticasone-Salmeterol (ADVAIR DISKUS) 100-50 MCG/DOSE AEPB Inhale 1 puff into the lungs every 12 (twelve) hours.      . hydrochlorothiazide (HYDRODIURIL) 25 MG tablet Take 25 mg by mouth daily.      . Ipratropium-Albuterol (COMBIVENT RESPIMAT) 20-100 MCG/ACT AERS respimat Inhale 2 puffs into the lungs every 6 (six) hours as needed. For shortness of breath      . lisinopril (PRINIVIL,ZESTRIL) 20 MG tablet Take 20 mg by mouth daily.      . metoprolol tartrate (LOPRESSOR) 25 MG tablet Take 25 mg by mouth 2 (two) times daily.      . pravastatin (PRAVACHOL) 20 MG tablet Take 20 mg by mouth daily.      . sertraline (ZOLOFT) 50 MG tablet       . traMADol (ULTRAM) 50 MG tablet Take 1 tablet (50 mg total) by mouth every 6 (six) hours as needed. For pain  30 tablet  0   No current facility-administered medications for this visit.    Family History  Problem Relation Age of Onset  . Emphysema Mother   . Hypertension Mother   . Emphysema Father   . Hypertension Father     History   Social History  . Marital Status: Married    Spouse Name: N/A    Number of Children: N/A  . Years of Education: N/A   Occupational History  . Not on file.   Social History Main Topics  . Smoking status: Current Every Day Smoker -- 0.25 packs/day for 40 years    Types: Cigarettes  . Smokeless tobacco: Current User  . Alcohol Use: No  . Drug Use: No  . Sexually Active: Not on file   Other Topics Concern  . Not on file   Social History Narrative  . No narrative on file     ROS: [x]  Positive   [ ]  Negative   [ ]  All sytems reviewed and are negative  General: [ ]  Weight loss, [ ]  Fever, [ ]  chills Neurologic: [ ]  Dizziness, [ ]  Blackouts, [ ]  Seizure [ ]  Stroke, [ ]  "Mini stroke", [ ]  Slurred speech, [ ]  Temporary blindness;  [x ] weakness/numbness in  legs, [ ]  Hoarseness Cardiac: [ ]  Chest  pain/pressure, [x]  Shortness of breath lying flat [ ]  Shortness of breath with exertion, [ ]  Atrial fibrillation or irregular heartbeat Vascular: [x ] Pain in legs with walking, [ ]  Pain in legs at rest, [ ]  Pain in legs at night,  [ ]  Non-healing ulcer, [ ]  Blood clot in vein/DVT,   Pulmonary: [ ]  Home oxygen, [ ]  Productive cough, [ ]  Coughing up blood, [ ]  Asthma,  [ ]  Wheezing Musculoskeletal:  [ ]  Arthritis, [ ]  Low back pain, [ ]  Joint pain Hematologic: [ ]  Easy Bruising, [ ]  Anemia; [ ]  Hepatitis Gastrointestinal: [ ]  Blood in stool, [ ]  Gastroesophageal Reflux/heartburn, [ ]  Trouble swallowing Urinary: [ ]  chronic Kidney disease, [ ]  on HD - [ ]  MWF or [ ]  TTHS, [ ]  Burning with urination, [ ]  Difficulty urinating Endocrine: [ ]  hx of diabetes, [ ]  hx of thyroid disease Skin: [ ]  Rashes, [ ]  Wounds Psychological: [ ]  Anxiety, [ ]  Depression   PHYSICAL EXAMINATION:  Filed Vitals:   06/24/12 1227  BP: 118/62  Pulse: 58  Resp: 16   Body mass index is 22.7 kg/(m^2).  General:  WDWN in NAD Gait: Normal HENT: WNL Eyes: PERRL Pulmonary: normal non-labored breathing , without Rales, rhonchi,  wheezing Cardiac: RRR, without  Murmurs, rubs or gallops; No carotid bruits Abdomen: soft, NT/ND, no masses Skin: no rashes, ulcers noted Vascular Exam/Pulses: bilateral palpable femoral pulses Extremities: without ischemic changes, no Gangrene , no cellulitis; no open wounds;  Musculoskeletal: no muscle wasting or atrophy  Neurologic: A&O X 3; Appropriate Affect ; SENSATION: normal; MOTOR FUNCTION:  moving all extremities equally. Speech is fluent/normal   Non-Invasive Vascular Imaging:   ABI's 06/24/12 Right 0.51 left 0.71  ABI's 09/25/11 Right 0.51 Left 0.55  CBC    Component Value Date/Time   WBC 7.4 11/27/2011 0450   RBC 2.80* 11/27/2011 0450   HGB 8.2* 11/27/2011 0450   HCT 25.7* 11/27/2011 0450   PLT 113* 11/27/2011 0450   MCV 91.8 11/27/2011 0450   MCH 29.3 11/27/2011  0450   MCHC 31.9 11/27/2011 0450   RDW 15.1 11/27/2011 0450    BMET    Component Value Date/Time   NA 135 11/27/2011 0450   K 4.2 11/27/2011 0450  CL 102 11/27/2011 0450   CO2 27 11/27/2011 0450   GLUCOSE 100* 11/27/2011 0450   BUN 12 11/27/2011 0450   CREATININE 1.08 11/27/2011 0450   CALCIUM 8.3* 11/27/2011 0450   GFRNONAA 74* 11/27/2011 0450   GFRAA 86* 11/27/2011 0450     ASSESSMENT/PLAN: 62 y.o. male who is s/p Right external iliac artery patch angioplasty November 2013 who returns today for ABI's  -pt is doing well and able to ambulate better than pre op.  He does not have any non healing ulcers on his lower extremities. -pt has stopped smoking and using E cigarettes-feels better -will have the pt return in a year for repeat ABI's and exam.  Pt knows to return sooner if he starts having non healing ulcers or increased pain in his feet. -Previous note of Dr. Arbie Cookey states he wants carotid duplex for carotid bruits-pt is asymptomatic and will obtain this at his next visit. -f/u one year for ABI's and carotid duplex   Doreatha Massed, PA-C [facility] [phone]  Clinic MD:  Pt seen and examined in conjunction with Dr. Arbie Cookey  I have examined the patient, reviewed and agree with above.  EARLY, TODD, MD 06/24/2012 1:35 PM

## 2013-02-18 ENCOUNTER — Other Ambulatory Visit: Payer: Self-pay | Admitting: Vascular Surgery

## 2013-02-18 DIAGNOSIS — I739 Peripheral vascular disease, unspecified: Secondary | ICD-10-CM

## 2013-02-18 DIAGNOSIS — I6529 Occlusion and stenosis of unspecified carotid artery: Secondary | ICD-10-CM

## 2013-02-18 DIAGNOSIS — R0989 Other specified symptoms and signs involving the circulatory and respiratory systems: Secondary | ICD-10-CM

## 2013-02-18 DIAGNOSIS — Z48812 Encounter for surgical aftercare following surgery on the circulatory system: Secondary | ICD-10-CM

## 2013-06-23 ENCOUNTER — Ambulatory Visit: Payer: Medicare Other | Admitting: Vascular Surgery

## 2013-06-23 ENCOUNTER — Other Ambulatory Visit (HOSPITAL_COMMUNITY): Payer: Medicare Other

## 2013-06-23 ENCOUNTER — Ambulatory Visit: Payer: Medicare Other | Admitting: Family

## 2013-06-23 ENCOUNTER — Encounter (HOSPITAL_COMMUNITY): Payer: Medicare Other

## 2013-06-24 ENCOUNTER — Encounter: Payer: Self-pay | Admitting: Family

## 2013-06-25 ENCOUNTER — Encounter: Payer: Self-pay | Admitting: Family

## 2013-06-25 ENCOUNTER — Ambulatory Visit (INDEPENDENT_AMBULATORY_CARE_PROVIDER_SITE_OTHER): Payer: Medicare Other | Admitting: Family

## 2013-06-25 ENCOUNTER — Ambulatory Visit (HOSPITAL_COMMUNITY)
Admission: RE | Admit: 2013-06-25 | Discharge: 2013-06-25 | Disposition: A | Discharge status: Home / Self Care | Payer: Medicare Other | Source: Ambulatory Visit | Attending: Vascular Surgery | Admitting: Vascular Surgery

## 2013-06-25 ENCOUNTER — Other Ambulatory Visit: Payer: Self-pay | Admitting: Vascular Surgery

## 2013-06-25 ENCOUNTER — Ambulatory Visit (INDEPENDENT_AMBULATORY_CARE_PROVIDER_SITE_OTHER)
Admission: RE | Admit: 2013-06-25 | Discharge: 2013-06-25 | Disposition: A | Discharge status: Home / Self Care | Payer: Medicare Other | Source: Ambulatory Visit | Attending: Vascular Surgery | Admitting: Vascular Surgery

## 2013-06-25 VITALS — BP 90/62 | HR 54 | Resp 16 | Ht 72.0 in | Wt 175.4 lb

## 2013-06-25 DIAGNOSIS — I6529 Occlusion and stenosis of unspecified carotid artery: Secondary | ICD-10-CM

## 2013-06-25 DIAGNOSIS — I739 Peripheral vascular disease, unspecified: Secondary | ICD-10-CM

## 2013-06-25 DIAGNOSIS — R0989 Other specified symptoms and signs involving the circulatory and respiratory systems: Secondary | ICD-10-CM

## 2013-06-25 DIAGNOSIS — Z48812 Encounter for surgical aftercare following surgery on the circulatory system: Secondary | ICD-10-CM | POA: Insufficient documentation

## 2013-06-25 HISTORY — DX: Occlusion and stenosis of unspecified carotid artery: I65.29

## 2013-06-25 NOTE — Progress Notes (Addendum)
[facility] [place] HISTORY AND PHYSICAL   MRN : [id number]  History of Present Illness:   Shaun Schmitt is a 63 y.o. male patient of Dr. Arbie Cookey who is s/p right external iliac artery patch angioplasty in November 2013, right to left fem-fem BPG in 2004, right renal artery stent 2004, known right SFA occlusion per angiogram 10/31/11. He also has a carotid bruit. He returns today for follow up. He denies any claudication symptoms in his legs with walking since the above vascular procedure, he denies non healing wounds. He denies any history of stroke or TIA symptoms. He has occasional bilateral hand tingling and numbness, worse in the left hand. His activity is limited by his dyspnea. He reports a couple of mild MI's, last one in 2005, no cardiac stents, no CABG.  Pt Diabetic: No Pt smoker: former smoker, quit in 2014, is smoking e-cigarettes  Pt meds include: Statin :Yes Betablocker: Yes ASA: Yes Other anticoagulants/antiplatelets: Plavix  Current Outpatient Prescriptions  Medication Sig Dispense Refill  . aspirin 81 MG tablet Take 81 mg by mouth daily.      . clopidogrel (PLAVIX) 75 MG tablet Take 75 mg by mouth daily.      . Fluticasone-Salmeterol (ADVAIR DISKUS) 100-50 MCG/DOSE AEPB Inhale 1 puff into the lungs every 12 (twelve) hours.      . Ipratropium-Albuterol (COMBIVENT RESPIMAT) 20-100 MCG/ACT AERS respimat Inhale 2 puffs into the lungs every 6 (six) hours as needed. For shortness of breath      . lisinopril (PRINIVIL,ZESTRIL) 20 MG tablet Take 10 mg by mouth daily.       . metoprolol tartrate (LOPRESSOR) 25 MG tablet Take 25 mg by mouth daily.       . Omega-3 Fatty Acids (FISH OIL) 1000 MG CAPS Take by mouth daily.      . pravastatin (PRAVACHOL) 20 MG tablet Take 20 mg by mouth daily.      . sertraline (ZOLOFT) 50 MG tablet Take 100 mg by mouth daily.       . traMADol (ULTRAM) 50 MG tablet Take 1 tablet (50 mg total) by mouth every 6 (six) hours as  needed. For pain  30 tablet  0  . clonazePAM (KLONOPIN) 0.5 MG tablet       . hydrochlorothiazide (HYDRODIURIL) 25 MG tablet Take 25 mg by mouth daily.       No current facility-administered medications for this visit.    Past Medical History  Diagnosis Date  . Pain in both knees     Right worse , bilateral Hips and ankles, numbness   . Arthritis     Gout  . Carotid artery occlusion   . COPD (chronic obstructive pulmonary disease)   . Emphysema   . Hypertension   . Myocardial infarction     X's 2  10-12 YRS AGO @ [place]  . Renal artery stenosis 12/07/2002    Stents  . Stenosis of ureter of transplanted kidney     RIGHT KIDNEY 'DOESN'T WORK'  . Bronchitis April 23, 2012    Past Surgical History  Procedure Laterality Date  . Joint replacement  2005    Right Hip  . Pr vein bypass graft,aorto-fem-pop    . Knee surgery    . Cardiac catheterization      cath 05/21/00 ([facility]) showed 50% ostial OM, 25-30% mRCA, EF 40-45%)  . Patch angioplasty  11/26/2011    Procedure: PATCH ANGIOPLASTY;  Surgeon: Larina Earthly, MD;  Location: [facility] OR;  Service: Vascular;  Laterality: Right;  Using Hemashield 0.8cm x 7.6 cm vascular patch.  . Repair iliac artery  11/26/2011    Procedure: REPAIR ILIAC ARTERY;  Surgeon: Larina Earthlyodd F Early, MD;  Location: [facility] OR;  Service: Vascular;  Laterality: Right;  With patch angioplasty.    Social History History  Substance Use Topics  . Smoking status: Light Tobacco Smoker -- 0.25 packs/day for 40 years    Types: E-cigarettes  . Smokeless tobacco: Never Used  . Alcohol Use: No    Family History Family History  Problem Relation Age of Onset  . Emphysema Mother   . Hypertension Mother   . Emphysema Father   . Hypertension Father    Allergies  Allergen Reactions  . Codeine Nausea And Vomiting    Upset stomach      REVIEW OF SYSTEMS: See HPI for pertinent positives and negatives.  Physical Examination Filed Vitals:   06/25/13 1209 06/25/13 1218   BP: 132/75 90/62  Pulse: 54 54  Resp:  16  Height:  6' (1.829 m)  Weight:  175 lb 6.4 oz (79.561 kg)  SpO2:  98%   Body mass index is 23.78 kg/(m^2).  General:  WDWN in NAD Gait: Normal HENT: WNL Eyes: Pupils equal Pulmonary: normal non-labored breathing, diminished breath sounds in all fields , without Rales, rhonchi,  wheezing Cardiac: RRR, no murmurs detected  Abdomen: soft, NT, no masses Skin: no rashes, ulcers noted;  no Gangrene , no cellulitis; no open wounds;   VASCULAR EXAM  Carotid Bruits Left Right   Negative Positive                             VASCULAR EXAM: Extremities without ischemic changes  without Gangrene; without open wounds.                                                                                                          LE Pulses LEFT RIGHT       FEMORAL  faintly palpable  1+ palpable        POPLITEAL  not palpable   not palpable       POSTERIOR TIBIAL  not palpable   not palpable        DORSALIS PEDIS      ANTERIOR TIBIAL not palpable  not palpable    Musculoskeletal: no muscle wasting or atrophy; no edema  Neurologic: A&O X 3; Appropriate Affect ;  SENSATION: normal; MOTOR FUNCTION: 5/5 Symmetric, CN 2-12 intact Speech is fluent/normal   Non-Invasive Vascular Imaging (06/25/2013):  CEREBROVASCULAR DUPLEX EVALUATION    INDICATION: Bruit    PREVIOUS INTERVENTION(S): N/A    DUPLEX EXAM:     RIGHT  LEFT  Peak Systolic Velocities (cm/s) End Diastolic Velocities (cm/s) Plaque LOCATION Peak Systolic Velocities (cm/s) End Diastolic Velocities (cm/s) Plaque  164 23  CCA PROXIMAL 106 20 HT  97 19 HT CCA MID 72 24 HT  86 20 CP CCA DISTAL 65 18 CP  283 63 CP ECA  0 0 occluded  146 38 CP ICA PROXIMAL 212 50 CP  140 31  ICA MID 192 46   102 31  ICA DISTAL 132 41     1.51 ICA / CCA Ratio (PSV) 2.94  Antegrade Vertebral Flow Retrograde  104 Brachial Systolic Pressure (mmHg) 80  Triphasic Brachial Artery Waveforms Monophasic     Plaque Morphology:  HM = Homogeneous, HT = Heterogeneous, CP = Calcific Plaque, SP = Smooth Plaque, IP = Irregular Plaque     ADDITIONAL FINDINGS:     IMPRESSION: Right internal carotid artery stenosis present in the less than 40% range. Right external carotid artery stenosis present. Left internal carotid artery stenosis present in the 40%-59% range. Left external carotid artery in the proximal segment color and spectral waveform could not be obtained suggestive of short segment occlusion. Left vertebral artery is retrograde with brachial pressure gradient present and monophasic left subclavian artery suggestive of subclavian steal syndrome.    Compared to the previous exam:  No previous [facility] [address] study to compare.   ABI's: Right: 0.69, TBI: 0.60, monophasic waveforms; Left: 0.81, TBI: 0.32, biphasic PT and monophasic DP waveforms Previous (06/24/12) ABI's: Right: 0.55, TBI: 0.26; Left: 0.71, TBI: 0.37  ASSESSMENT:  Shaun Schmitt is a 63 y.o. male who is s/p right external iliac artery patch angioplasty in November 2013, right to left fem-fem BPG in 2004, right renal artery stent 2004, known right SFA occlusion per angiogram 10/31/11. He also has a carotid bruit. He returns today for follow up. He denies any claudication symptoms in his legs with walking since the above vascular procedure, he denies non healing wounds. He denies any history of stroke or TIA symptoms. He has occasional bilateral hand tingling and numbness, worse in the left hand. His activity is limited by his dyspnea. He reports a couple of mild MI's, last one in 2005, no cardiac stents, no CABG.  He stopped smoking cigarettes last year but is using vapor cigarettes which may have some unknown amount of nicotine.  Right internal carotid artery stenosis present in the less than 40% range. Right external carotid artery stenosis present. Left internal carotid artery stenosis present in the 40%-59%  range. Left external carotid artery in the proximal segment color and spectral waveform could not be obtained suggestive of short segment occlusion. Left vertebral artery is retrograde with brachial pressure gradient present and monophasic left subclavian artery suggestive of subclavian steal syndrome. No previous [facility] [address] study to compare.  PLAN:   Based on today's exam and non-invasive vascular lab results, the patient will follow up in 1 year with the following tests: carotid Duplex, renal artery stent evaluation, and ABI's. I discussed in depth with the patient the nature of atherosclerosis, and emphasized the importance of maximal medical management including strict control of blood pressure, blood glucose, and lipid levels, obtaining regular exercise, and cessation of smoking.  The patient is aware that without maximal medical management the underlying atherosclerotic disease process will progress, limiting the benefit of any interventions. The patient was given information about stroke prevention and what symptoms should prompt the patient to seek immediate medical care.  The patient was given information about PAD including signs, symptoms, treatment, what symptoms should prompt the patient to seek immediate medical care, and risk reduction measures to take. Thank you for allowing us to participate in this patient's care.  Charisse MarchSuzanne Nickel, RN, MSN, FNP-C [facility] Office: [phone]  Clinic MD: Myra GianottiBrabham on call 06/25/2013 12:35  PM

## 2013-06-25 NOTE — Patient Instructions (Signed)
Stroke Prevention Some medical conditions and behaviors are associated with an increased chance of having a stroke. You may prevent a stroke by making healthy choices and managing medical conditions. HOW CAN I REDUCE MY RISK OF HAVING A STROKE?   Stay physically active. Get at least 30 minutes of activity on most or all days.  Do not smoke. It may also be helpful to avoid exposure to secondhand smoke.  Limit alcohol use. Moderate alcohol use is considered to be:  No more than 2 drinks per day for men.  No more than 1 drink per day for nonpregnant women.  Eat healthy foods. This involves  Eating 5 or more servings of fruits and vegetables a day.  Following a diet that addresses high blood pressure (hypertension), high cholesterol, diabetes, or obesity.  Manage your cholesterol levels.  A diet low in saturated fat, trans fat, and cholesterol and high in fiber may control cholesterol levels.  Take any prescribed medicines to control cholesterol as directed by your health care provider.  Manage your diabetes.  A controlled-carbohydrate, controlled-sugar diet is recommended to manage diabetes.  Take any prescribed medicines to control diabetes as directed by your health care provider.  Control your hypertension.  A low-salt (sodium), low-saturated fat, low-trans fat, and low-cholesterol diet is recommended to manage hypertension.  Take any prescribed medicines to control hypertension as directed by your health care provider.  Maintain a healthy weight.  A reduced-calorie, low-sodium, low-saturated fat, low-trans fat, low-cholesterol diet is recommended to manage weight.  Stop drug abuse.  Avoid taking birth control pills.  Talk to your health care provider about the risks of taking birth control pills if you are over 35 years old, smoke, get migraines, or have ever had a blood clot.  Get evaluated for sleep disorders (sleep apnea).  Talk to your health care provider about  getting a sleep evaluation if you snore a lot or have excessive sleepiness.  Take medicines as directed by your health care provider.  For some people, aspirin or blood thinners (anticoagulants) are helpful in reducing the risk of forming abnormal blood clots that can lead to stroke. If you have the irregular heart rhythm of atrial fibrillation, you should be on a blood thinner unless there is a good reason you cannot take them.  Understand all your medicine instructions.  Make sure that other other conditions (such as anemia or atherosclerosis) are addressed. SEEK IMMEDIATE MEDICAL CARE IF:   You have sudden weakness or numbness of the face, arm, or leg, especially on one side of the body.  Your face or eyelid droops to one side.  You have sudden confusion.  You have trouble speaking (aphasia) or understanding.  You have sudden trouble seeing in one or both eyes.  You have sudden trouble walking.  You have dizziness.  You have a loss of balance or coordination.  You have a sudden, severe headache with no known cause.  You have new chest pain or an irregular heartbeat. Any of these symptoms may represent a serious problem that is an emergency. Do not wait to see if the symptoms will go away. Get medical help at once. Call your local emergency services  (911 in [place] Do not drive yourself to the hospital. Document Released: 02/02/2004 Document Revised: 10/15/2012 Document Reviewed: 06/27/2012 ExitCare Patient Information 2015 [employer]. This information is not intended to replace advice given to you by your health care provider. Make sure you discuss any questions you have with your health   care provider.   Peripheral Vascular Disease Peripheral Vascular Disease (PVD), also called Peripheral Arterial Disease (PAD), is a circulation problem caused by cholesterol (atherosclerotic plaque) deposits in the arteries. PVD commonly occurs in the lower extremities (legs) but it can  occur in other areas of the body, such as your arms. The cholesterol buildup in the arteries reduces blood flow which can cause pain and other serious problems. The presence of PVD can place a person at risk for Coronary Artery Disease (CAD).  CAUSES  Causes of PVD can be many. It is usually associated with more than one risk factor such as:   High Cholesterol.  Smoking.  Diabetes.  Lack of exercise or inactivity.  High blood pressure (hypertension).  Obesity.  Family history. SYMPTOMS   When the lower extremities are affected, patients with PVD may experience:  Leg pain with exertion or physical activity. This is called INTERMITTENT CLAUDICATION. This may present as cramping or numbness with physical activity. The location of the pain is associated with the level of blockage. For example, blockage at the abdominal level (distal abdominal aorta) may result in buttock or hip pain. Lower leg arterial blockage may result in calf pain.  As PVD becomes more severe, pain can develop with less physical activity.  In people with severe PVD, leg pain may occur at rest.  Other PVD signs and symptoms:  Leg numbness or weakness.  Coldness in the affected leg or foot, especially when compared to the other leg.  A change in leg color.  Patients with significant PVD are more prone to ulcers or sores on toes, feet or legs. These may take longer to heal or may reoccur. The ulcers or sores can become infected.  If signs and symptoms of PVD are ignored, gangrene may occur. This can result in the loss of toes or loss of an entire limb.  Not all leg pain is related to PVD. Other medical conditions can cause leg pain such as:  Blood clots (embolism) or Deep Vein Thrombosis.  Inflammation of the blood vessels (vasculitis).  Spinal stenosis. DIAGNOSIS  Diagnosis of PVD can involve several different types of tests. These can include:  Pulse Volume Recording Method (PVR). This test is simple,  painless and does not involve the use of X-rays. PVR involves measuring and comparing the blood pressure in the arms and legs. An ABI (Ankle-Brachial Index) is calculated. The normal ratio of blood pressures is 1. As this number becomes smaller, it indicates more severe disease.  < 0.95 - indicates significant narrowing in one or more leg vessels.  <0.8 - there will usually be pain in the foot, leg or buttock with exercise.  <0.4 - will usually have pain in the legs at rest.  <0.25 - usually indicates limb threatening PVD.  Doppler detection of pulses in the legs. This test is painless and checks to see if you have a pulses in your legs/feet.  A dye or contrast material (a substance that highlights the blood vessels so they show up on x-ray) may be given to help your caregiver better see the arteries for the following tests. The dye is eliminated from your body by the kidney's. Your caregiver may order blood work to check your kidney function and other laboratory values before the following tests are performed:  Magnetic Resonance Angiography (MRA). An MRA is a picture study of the blood vessels and arteries. The MRA machine uses a large magnet to produce images of the blood vessels.  Computed   Tomography Angiography (CTA). A CTA is a specialized x-ray that looks at how the blood flows in your blood vessels. An IV may be inserted into your arm so contrast dye can be injected.  Angiogram. Is a procedure that uses x-rays to look at your blood vessels. This procedure is minimally invasive, meaning a small incision (cut) is made in your groin. A small tube (catheter) is then inserted into the artery of your groin. The catheter is guided to the blood vessel or artery your caregiver wants to examine. Contrast dye is injected into the catheter. X-rays are then taken of the blood vessel or artery. After the images are obtained, the catheter is taken out. TREATMENT  Treatment of PVD involves many  interventions which may include:  Lifestyle changes:  Quitting smoking.  Exercise.  Following a low fat, low cholesterol diet.  Control of diabetes.  Foot care is very important to the PVD patient. Good foot care can help prevent infection.  Medication:  Cholesterol-lowering medicine.  Blood pressure medicine.  Anti-platelet drugs.  Certain medicines may reduce symptoms of Intermittent Claudication.  Interventional/Surgical options:  Angioplasty. An Angioplasty is a procedure that inflates a balloon in the blocked artery. This opens the blocked artery to improve blood flow.  Stent Implant. A wire mesh tube (stent) is placed in the artery. The stent expands and stays in place, allowing the artery to remain open.  Peripheral Bypass Surgery. This is a surgical procedure that reroutes the blood around a blocked artery to help improve blood flow. This type of procedure may be performed if Angioplasty or stent implants are not an option. SEEK IMMEDIATE MEDICAL CARE IF:   You develop pain or numbness in your arms or legs.  Your arm or leg turns cold, becomes blue in color.  You develop redness, warmth, swelling and pain in your arms or legs. MAKE SURE YOU:   Understand these instructions.  Will watch your condition.  Will get help right away if you are not doing well or get worse. Document Released: 02/02/2004 Document Revised: 03/19/2011 Document Reviewed: 12/30/2007 ExitCare Patient Information 2015 [employer]. This information is not intended to replace advice given to you by your health care provider. Make sure you discuss any questions you have with your health care provider.  

## 2013-06-26 ENCOUNTER — Other Ambulatory Visit: Payer: Self-pay | Admitting: *Deleted

## 2013-06-26 DIAGNOSIS — I701 Atherosclerosis of renal artery: Secondary | ICD-10-CM

## 2013-06-26 DIAGNOSIS — I6529 Occlusion and stenosis of unspecified carotid artery: Secondary | ICD-10-CM

## 2013-06-26 DIAGNOSIS — I739 Peripheral vascular disease, unspecified: Secondary | ICD-10-CM

## 2013-12-17 ENCOUNTER — Encounter (HOSPITAL_COMMUNITY): Payer: Self-pay | Admitting: Vascular Surgery

## 2014-03-01 DIAGNOSIS — I2510 Atherosclerotic heart disease of native coronary artery without angina pectoris: Secondary | ICD-10-CM | POA: Diagnosis not present

## 2014-03-01 DIAGNOSIS — I10 Essential (primary) hypertension: Secondary | ICD-10-CM | POA: Diagnosis not present

## 2014-03-01 DIAGNOSIS — Z0000 Encounter for general adult medical examination without abnormal findings: Secondary | ICD-10-CM | POA: Diagnosis not present

## 2014-03-01 DIAGNOSIS — M1A9XX0 Chronic gout, unspecified, without tophus (tophi): Secondary | ICD-10-CM | POA: Diagnosis not present

## 2014-03-01 DIAGNOSIS — E785 Hyperlipidemia, unspecified: Secondary | ICD-10-CM | POA: Diagnosis not present

## 2014-04-10 DIAGNOSIS — Z9889 Other specified postprocedural states: Secondary | ICD-10-CM | POA: Diagnosis not present

## 2014-04-10 DIAGNOSIS — R0989 Other specified symptoms and signs involving the circulatory and respiratory systems: Secondary | ICD-10-CM | POA: Diagnosis not present

## 2014-04-10 DIAGNOSIS — J441 Chronic obstructive pulmonary disease with (acute) exacerbation: Secondary | ICD-10-CM | POA: Diagnosis not present

## 2014-04-10 DIAGNOSIS — J9601 Acute respiratory failure with hypoxia: Secondary | ICD-10-CM | POA: Diagnosis not present

## 2014-04-10 DIAGNOSIS — F1021 Alcohol dependence, in remission: Secondary | ICD-10-CM | POA: Diagnosis not present

## 2014-04-10 DIAGNOSIS — Z23 Encounter for immunization: Secondary | ICD-10-CM | POA: Diagnosis not present

## 2014-04-10 DIAGNOSIS — Z79899 Other long term (current) drug therapy: Secondary | ICD-10-CM | POA: Diagnosis not present

## 2014-04-10 DIAGNOSIS — F419 Anxiety disorder, unspecified: Secondary | ICD-10-CM | POA: Diagnosis not present

## 2014-04-10 DIAGNOSIS — J449 Chronic obstructive pulmonary disease, unspecified: Secondary | ICD-10-CM | POA: Diagnosis not present

## 2014-04-10 DIAGNOSIS — Z7902 Long term (current) use of antithrombotics/antiplatelets: Secondary | ICD-10-CM | POA: Diagnosis not present

## 2014-04-10 DIAGNOSIS — Z79891 Long term (current) use of opiate analgesic: Secondary | ICD-10-CM | POA: Diagnosis not present

## 2014-04-10 DIAGNOSIS — N289 Disorder of kidney and ureter, unspecified: Secondary | ICD-10-CM | POA: Diagnosis not present

## 2014-04-10 DIAGNOSIS — J181 Lobar pneumonia, unspecified organism: Secondary | ICD-10-CM | POA: Diagnosis not present

## 2014-04-10 DIAGNOSIS — J189 Pneumonia, unspecified organism: Secondary | ICD-10-CM | POA: Diagnosis not present

## 2014-04-10 DIAGNOSIS — I2510 Atherosclerotic heart disease of native coronary artery without angina pectoris: Secondary | ICD-10-CM | POA: Diagnosis not present

## 2014-04-10 DIAGNOSIS — Z9981 Dependence on supplemental oxygen: Secondary | ICD-10-CM | POA: Diagnosis not present

## 2014-04-10 DIAGNOSIS — F28 Other psychotic disorder not due to a substance or known physiological condition: Secondary | ICD-10-CM | POA: Diagnosis not present

## 2014-04-10 DIAGNOSIS — R0602 Shortness of breath: Secondary | ICD-10-CM | POA: Diagnosis not present

## 2014-04-10 DIAGNOSIS — J988 Other specified respiratory disorders: Secondary | ICD-10-CM | POA: Diagnosis not present

## 2014-04-10 DIAGNOSIS — J40 Bronchitis, not specified as acute or chronic: Secondary | ICD-10-CM | POA: Diagnosis not present

## 2014-04-10 DIAGNOSIS — J984 Other disorders of lung: Secondary | ICD-10-CM | POA: Diagnosis not present

## 2014-04-10 DIAGNOSIS — I10 Essential (primary) hypertension: Secondary | ICD-10-CM | POA: Diagnosis not present

## 2014-04-10 DIAGNOSIS — J80 Acute respiratory distress syndrome: Secondary | ICD-10-CM | POA: Diagnosis not present

## 2014-04-10 DIAGNOSIS — R069 Unspecified abnormalities of breathing: Secondary | ICD-10-CM | POA: Diagnosis not present

## 2014-04-10 DIAGNOSIS — D72825 Bandemia: Secondary | ICD-10-CM | POA: Diagnosis not present

## 2014-04-10 DIAGNOSIS — J9690 Respiratory failure, unspecified, unspecified whether with hypoxia or hypercapnia: Secondary | ICD-10-CM | POA: Diagnosis not present

## 2014-04-10 DIAGNOSIS — Z7982 Long term (current) use of aspirin: Secondary | ICD-10-CM | POA: Diagnosis not present

## 2014-04-10 DIAGNOSIS — Z87891 Personal history of nicotine dependence: Secondary | ICD-10-CM | POA: Diagnosis not present

## 2014-04-10 DIAGNOSIS — Z885 Allergy status to narcotic agent status: Secondary | ICD-10-CM | POA: Diagnosis not present

## 2014-04-10 DIAGNOSIS — A419 Sepsis, unspecified organism: Secondary | ICD-10-CM | POA: Diagnosis not present

## 2014-04-10 DIAGNOSIS — I252 Old myocardial infarction: Secondary | ICD-10-CM | POA: Diagnosis not present

## 2014-04-14 DIAGNOSIS — J449 Chronic obstructive pulmonary disease, unspecified: Secondary | ICD-10-CM | POA: Diagnosis not present

## 2014-04-16 DIAGNOSIS — B370 Candidal stomatitis: Secondary | ICD-10-CM | POA: Diagnosis not present

## 2014-04-16 DIAGNOSIS — J449 Chronic obstructive pulmonary disease, unspecified: Secondary | ICD-10-CM | POA: Diagnosis not present

## 2014-04-23 DIAGNOSIS — F411 Generalized anxiety disorder: Secondary | ICD-10-CM | POA: Diagnosis not present

## 2014-04-23 DIAGNOSIS — J449 Chronic obstructive pulmonary disease, unspecified: Secondary | ICD-10-CM | POA: Diagnosis not present

## 2014-04-23 DIAGNOSIS — B370 Candidal stomatitis: Secondary | ICD-10-CM | POA: Diagnosis not present

## 2014-04-23 DIAGNOSIS — B3789 Other sites of candidiasis: Secondary | ICD-10-CM | POA: Diagnosis not present

## 2014-05-14 DIAGNOSIS — J449 Chronic obstructive pulmonary disease, unspecified: Secondary | ICD-10-CM | POA: Diagnosis not present

## 2014-06-01 DIAGNOSIS — I10 Essential (primary) hypertension: Secondary | ICD-10-CM | POA: Diagnosis not present

## 2014-06-01 DIAGNOSIS — F411 Generalized anxiety disorder: Secondary | ICD-10-CM | POA: Diagnosis not present

## 2014-06-01 DIAGNOSIS — J449 Chronic obstructive pulmonary disease, unspecified: Secondary | ICD-10-CM | POA: Diagnosis not present

## 2014-06-27 DIAGNOSIS — J449 Chronic obstructive pulmonary disease, unspecified: Secondary | ICD-10-CM | POA: Diagnosis not present

## 2014-07-01 ENCOUNTER — Other Ambulatory Visit (HOSPITAL_COMMUNITY): Payer: Medicare Other

## 2014-07-01 ENCOUNTER — Encounter (HOSPITAL_COMMUNITY): Payer: Medicare Other

## 2014-07-01 ENCOUNTER — Ambulatory Visit: Payer: Self-pay | Admitting: Family

## 2014-07-02 DIAGNOSIS — J441 Chronic obstructive pulmonary disease with (acute) exacerbation: Secondary | ICD-10-CM | POA: Diagnosis not present

## 2014-07-02 DIAGNOSIS — J209 Acute bronchitis, unspecified: Secondary | ICD-10-CM | POA: Diagnosis not present

## 2014-08-04 DIAGNOSIS — F411 Generalized anxiety disorder: Secondary | ICD-10-CM | POA: Diagnosis not present

## 2014-08-04 DIAGNOSIS — R972 Elevated prostate specific antigen [PSA]: Secondary | ICD-10-CM | POA: Diagnosis not present

## 2014-08-04 DIAGNOSIS — J449 Chronic obstructive pulmonary disease, unspecified: Secondary | ICD-10-CM | POA: Diagnosis not present

## 2014-09-01 DIAGNOSIS — F411 Generalized anxiety disorder: Secondary | ICD-10-CM | POA: Diagnosis not present

## 2014-10-04 DIAGNOSIS — J449 Chronic obstructive pulmonary disease, unspecified: Secondary | ICD-10-CM | POA: Diagnosis not present

## 2014-10-04 DIAGNOSIS — R5383 Other fatigue: Secondary | ICD-10-CM | POA: Diagnosis not present

## 2014-10-05 DIAGNOSIS — J45909 Unspecified asthma, uncomplicated: Secondary | ICD-10-CM | POA: Diagnosis not present

## 2014-10-05 DIAGNOSIS — E559 Vitamin D deficiency, unspecified: Secondary | ICD-10-CM | POA: Diagnosis not present

## 2014-10-18 DIAGNOSIS — R5383 Other fatigue: Secondary | ICD-10-CM | POA: Diagnosis not present

## 2014-10-18 DIAGNOSIS — J449 Chronic obstructive pulmonary disease, unspecified: Secondary | ICD-10-CM | POA: Diagnosis not present

## 2014-10-19 DIAGNOSIS — J449 Chronic obstructive pulmonary disease, unspecified: Secondary | ICD-10-CM | POA: Diagnosis not present

## 2014-11-09 DIAGNOSIS — J449 Chronic obstructive pulmonary disease, unspecified: Secondary | ICD-10-CM | POA: Diagnosis not present

## 2014-11-15 DIAGNOSIS — J449 Chronic obstructive pulmonary disease, unspecified: Secondary | ICD-10-CM | POA: Diagnosis not present

## 2014-11-15 DIAGNOSIS — R5383 Other fatigue: Secondary | ICD-10-CM | POA: Diagnosis not present

## 2014-11-19 DIAGNOSIS — J449 Chronic obstructive pulmonary disease, unspecified: Secondary | ICD-10-CM | POA: Diagnosis not present

## 2014-11-23 DIAGNOSIS — R0602 Shortness of breath: Secondary | ICD-10-CM | POA: Diagnosis not present

## 2014-11-23 DIAGNOSIS — I252 Old myocardial infarction: Secondary | ICD-10-CM | POA: Diagnosis not present

## 2014-11-23 DIAGNOSIS — J441 Chronic obstructive pulmonary disease with (acute) exacerbation: Secondary | ICD-10-CM | POA: Diagnosis not present

## 2014-11-23 DIAGNOSIS — Z87891 Personal history of nicotine dependence: Secondary | ICD-10-CM | POA: Diagnosis not present

## 2014-11-23 DIAGNOSIS — I2510 Atherosclerotic heart disease of native coronary artery without angina pectoris: Secondary | ICD-10-CM | POA: Diagnosis not present

## 2014-11-23 DIAGNOSIS — I10 Essential (primary) hypertension: Secondary | ICD-10-CM | POA: Diagnosis not present

## 2014-11-23 DIAGNOSIS — J9621 Acute and chronic respiratory failure with hypoxia: Secondary | ICD-10-CM | POA: Diagnosis not present

## 2014-11-23 DIAGNOSIS — R911 Solitary pulmonary nodule: Secondary | ICD-10-CM | POA: Diagnosis not present

## 2014-11-23 DIAGNOSIS — R079 Chest pain, unspecified: Secondary | ICD-10-CM | POA: Diagnosis not present

## 2014-11-23 DIAGNOSIS — Z7982 Long term (current) use of aspirin: Secondary | ICD-10-CM | POA: Diagnosis not present

## 2014-11-23 DIAGNOSIS — R072 Precordial pain: Secondary | ICD-10-CM | POA: Diagnosis not present

## 2014-11-23 DIAGNOSIS — R0600 Dyspnea, unspecified: Secondary | ICD-10-CM | POA: Diagnosis not present

## 2014-11-23 DIAGNOSIS — Z23 Encounter for immunization: Secondary | ICD-10-CM | POA: Diagnosis not present

## 2014-11-23 DIAGNOSIS — Z79899 Other long term (current) drug therapy: Secondary | ICD-10-CM | POA: Diagnosis not present

## 2014-11-23 DIAGNOSIS — J159 Unspecified bacterial pneumonia: Secondary | ICD-10-CM | POA: Diagnosis not present

## 2014-11-26 DIAGNOSIS — R911 Solitary pulmonary nodule: Secondary | ICD-10-CM | POA: Diagnosis not present

## 2014-11-26 DIAGNOSIS — Z87891 Personal history of nicotine dependence: Secondary | ICD-10-CM | POA: Diagnosis not present

## 2014-11-26 DIAGNOSIS — I2510 Atherosclerotic heart disease of native coronary artery without angina pectoris: Secondary | ICD-10-CM | POA: Diagnosis not present

## 2014-11-26 DIAGNOSIS — Z23 Encounter for immunization: Secondary | ICD-10-CM | POA: Diagnosis not present

## 2014-11-26 DIAGNOSIS — Z7982 Long term (current) use of aspirin: Secondary | ICD-10-CM | POA: Diagnosis not present

## 2014-11-26 DIAGNOSIS — I10 Essential (primary) hypertension: Secondary | ICD-10-CM | POA: Diagnosis not present

## 2014-11-26 DIAGNOSIS — J159 Unspecified bacterial pneumonia: Secondary | ICD-10-CM | POA: Diagnosis not present

## 2014-11-26 DIAGNOSIS — J9621 Acute and chronic respiratory failure with hypoxia: Secondary | ICD-10-CM | POA: Diagnosis not present

## 2014-11-26 DIAGNOSIS — I252 Old myocardial infarction: Secondary | ICD-10-CM | POA: Diagnosis not present

## 2014-11-26 DIAGNOSIS — Z79899 Other long term (current) drug therapy: Secondary | ICD-10-CM | POA: Diagnosis not present

## 2014-11-26 DIAGNOSIS — J441 Chronic obstructive pulmonary disease with (acute) exacerbation: Secondary | ICD-10-CM | POA: Diagnosis not present

## 2014-11-27 DIAGNOSIS — Z9989 Dependence on other enabling machines and devices: Secondary | ICD-10-CM | POA: Diagnosis not present

## 2014-11-27 DIAGNOSIS — J189 Pneumonia, unspecified organism: Secondary | ICD-10-CM | POA: Diagnosis not present

## 2014-11-27 DIAGNOSIS — R0902 Hypoxemia: Secondary | ICD-10-CM | POA: Diagnosis not present

## 2014-11-27 DIAGNOSIS — R0602 Shortness of breath: Secondary | ICD-10-CM | POA: Diagnosis not present

## 2014-11-27 DIAGNOSIS — Z452 Encounter for adjustment and management of vascular access device: Secondary | ICD-10-CM | POA: Diagnosis not present

## 2014-11-27 DIAGNOSIS — K219 Gastro-esophageal reflux disease without esophagitis: Secondary | ICD-10-CM | POA: Diagnosis not present

## 2014-11-27 DIAGNOSIS — J181 Lobar pneumonia, unspecified organism: Secondary | ICD-10-CM | POA: Diagnosis not present

## 2014-11-27 DIAGNOSIS — K521 Toxic gastroenteritis and colitis: Secondary | ICD-10-CM | POA: Diagnosis not present

## 2014-11-27 DIAGNOSIS — J9690 Respiratory failure, unspecified, unspecified whether with hypoxia or hypercapnia: Secondary | ICD-10-CM | POA: Diagnosis not present

## 2014-11-27 DIAGNOSIS — R918 Other nonspecific abnormal finding of lung field: Secondary | ICD-10-CM | POA: Diagnosis not present

## 2014-11-27 DIAGNOSIS — R05 Cough: Secondary | ICD-10-CM | POA: Diagnosis not present

## 2014-11-27 DIAGNOSIS — T3695XA Adverse effect of unspecified systemic antibiotic, initial encounter: Secondary | ICD-10-CM | POA: Diagnosis not present

## 2014-11-27 DIAGNOSIS — J9600 Acute respiratory failure, unspecified whether with hypoxia or hypercapnia: Secondary | ICD-10-CM | POA: Diagnosis not present

## 2014-11-27 DIAGNOSIS — N189 Chronic kidney disease, unspecified: Secondary | ICD-10-CM | POA: Diagnosis not present

## 2014-11-27 DIAGNOSIS — M1990 Unspecified osteoarthritis, unspecified site: Secondary | ICD-10-CM | POA: Diagnosis not present

## 2014-11-27 DIAGNOSIS — I2510 Atherosclerotic heart disease of native coronary artery without angina pectoris: Secondary | ICD-10-CM | POA: Diagnosis not present

## 2014-11-27 DIAGNOSIS — Z9981 Dependence on supplemental oxygen: Secondary | ICD-10-CM | POA: Diagnosis not present

## 2014-11-27 DIAGNOSIS — R079 Chest pain, unspecified: Secondary | ICD-10-CM | POA: Diagnosis not present

## 2014-11-27 DIAGNOSIS — I252 Old myocardial infarction: Secondary | ICD-10-CM | POA: Diagnosis not present

## 2014-11-27 DIAGNOSIS — I129 Hypertensive chronic kidney disease with stage 1 through stage 4 chronic kidney disease, or unspecified chronic kidney disease: Secondary | ICD-10-CM | POA: Diagnosis not present

## 2014-11-27 DIAGNOSIS — B009 Herpesviral infection, unspecified: Secondary | ICD-10-CM | POA: Diagnosis not present

## 2014-11-27 DIAGNOSIS — J441 Chronic obstructive pulmonary disease with (acute) exacerbation: Secondary | ICD-10-CM | POA: Diagnosis not present

## 2014-11-27 DIAGNOSIS — J156 Pneumonia due to other aerobic Gram-negative bacteria: Secondary | ICD-10-CM | POA: Diagnosis not present

## 2014-11-27 DIAGNOSIS — D61818 Other pancytopenia: Secondary | ICD-10-CM | POA: Diagnosis not present

## 2014-11-27 DIAGNOSIS — R911 Solitary pulmonary nodule: Secondary | ICD-10-CM | POA: Diagnosis not present

## 2014-11-27 DIAGNOSIS — R042 Hemoptysis: Secondary | ICD-10-CM | POA: Diagnosis not present

## 2014-11-27 DIAGNOSIS — A419 Sepsis, unspecified organism: Secondary | ICD-10-CM | POA: Diagnosis not present

## 2014-11-27 DIAGNOSIS — I4891 Unspecified atrial fibrillation: Secondary | ICD-10-CM | POA: Diagnosis not present

## 2014-11-27 DIAGNOSIS — R093 Abnormal sputum: Secondary | ICD-10-CM | POA: Diagnosis not present

## 2014-11-27 DIAGNOSIS — R112 Nausea with vomiting, unspecified: Secondary | ICD-10-CM | POA: Diagnosis not present

## 2014-11-27 DIAGNOSIS — J449 Chronic obstructive pulmonary disease, unspecified: Secondary | ICD-10-CM | POA: Diagnosis not present

## 2014-11-27 DIAGNOSIS — F419 Anxiety disorder, unspecified: Secondary | ICD-10-CM | POA: Diagnosis not present

## 2014-11-27 DIAGNOSIS — J17 Pneumonia in diseases classified elsewhere: Secondary | ICD-10-CM | POA: Diagnosis not present

## 2014-11-27 DIAGNOSIS — Z96641 Presence of right artificial hip joint: Secondary | ICD-10-CM | POA: Diagnosis not present

## 2014-11-27 DIAGNOSIS — B0089 Other herpesviral infection: Secondary | ICD-10-CM | POA: Diagnosis not present

## 2014-11-27 DIAGNOSIS — R0600 Dyspnea, unspecified: Secondary | ICD-10-CM | POA: Diagnosis not present

## 2014-11-27 DIAGNOSIS — J9620 Acute and chronic respiratory failure, unspecified whether with hypoxia or hypercapnia: Secondary | ICD-10-CM | POA: Diagnosis not present

## 2014-11-27 DIAGNOSIS — R571 Hypovolemic shock: Secondary | ICD-10-CM | POA: Diagnosis not present

## 2014-11-27 DIAGNOSIS — J159 Unspecified bacterial pneumonia: Secondary | ICD-10-CM | POA: Diagnosis not present

## 2014-11-27 DIAGNOSIS — J9621 Acute and chronic respiratory failure with hypoxia: Secondary | ICD-10-CM | POA: Diagnosis not present

## 2014-11-27 DIAGNOSIS — J9601 Acute respiratory failure with hypoxia: Secondary | ICD-10-CM | POA: Diagnosis not present

## 2014-11-27 DIAGNOSIS — J851 Abscess of lung with pneumonia: Secondary | ICD-10-CM | POA: Diagnosis not present

## 2014-11-27 DIAGNOSIS — B370 Candidal stomatitis: Secondary | ICD-10-CM | POA: Diagnosis not present

## 2014-11-27 DIAGNOSIS — R000 Tachycardia, unspecified: Secondary | ICD-10-CM | POA: Diagnosis not present

## 2014-11-27 DIAGNOSIS — J1289 Other viral pneumonia: Secondary | ICD-10-CM | POA: Diagnosis not present

## 2014-11-27 DIAGNOSIS — I701 Atherosclerosis of renal artery: Secondary | ICD-10-CM | POA: Diagnosis not present

## 2014-11-27 DIAGNOSIS — I10 Essential (primary) hypertension: Secondary | ICD-10-CM | POA: Diagnosis not present

## 2014-12-13 DIAGNOSIS — J9621 Acute and chronic respiratory failure with hypoxia: Secondary | ICD-10-CM | POA: Diagnosis not present

## 2014-12-27 DIAGNOSIS — R0600 Dyspnea, unspecified: Secondary | ICD-10-CM | POA: Diagnosis not present

## 2014-12-28 DIAGNOSIS — B009 Herpesviral infection, unspecified: Secondary | ICD-10-CM | POA: Diagnosis not present

## 2014-12-28 DIAGNOSIS — I10 Essential (primary) hypertension: Secondary | ICD-10-CM | POA: Insufficient documentation

## 2014-12-28 DIAGNOSIS — I749 Embolism and thrombosis of unspecified artery: Secondary | ICD-10-CM | POA: Diagnosis not present

## 2014-12-28 DIAGNOSIS — I252 Old myocardial infarction: Secondary | ICD-10-CM | POA: Diagnosis not present

## 2014-12-28 DIAGNOSIS — I748 Embolism and thrombosis of other arteries: Secondary | ICD-10-CM | POA: Diagnosis not present

## 2014-12-28 DIAGNOSIS — F419 Anxiety disorder, unspecified: Secondary | ICD-10-CM | POA: Diagnosis not present

## 2014-12-28 DIAGNOSIS — E119 Type 2 diabetes mellitus without complications: Secondary | ICD-10-CM | POA: Diagnosis not present

## 2014-12-28 DIAGNOSIS — I2510 Atherosclerotic heart disease of native coronary artery without angina pectoris: Secondary | ICD-10-CM

## 2014-12-28 DIAGNOSIS — Z720 Tobacco use: Secondary | ICD-10-CM | POA: Diagnosis not present

## 2014-12-28 DIAGNOSIS — Z9981 Dependence on supplemental oxygen: Secondary | ICD-10-CM | POA: Diagnosis not present

## 2014-12-28 DIAGNOSIS — R0902 Hypoxemia: Secondary | ICD-10-CM | POA: Diagnosis not present

## 2014-12-28 DIAGNOSIS — R918 Other nonspecific abnormal finding of lung field: Secondary | ICD-10-CM | POA: Diagnosis not present

## 2014-12-28 DIAGNOSIS — Z452 Encounter for adjustment and management of vascular access device: Secondary | ICD-10-CM | POA: Diagnosis not present

## 2014-12-28 DIAGNOSIS — A419 Sepsis, unspecified organism: Secondary | ICD-10-CM | POA: Diagnosis not present

## 2014-12-28 DIAGNOSIS — J449 Chronic obstructive pulmonary disease, unspecified: Secondary | ICD-10-CM

## 2014-12-28 DIAGNOSIS — I493 Ventricular premature depolarization: Secondary | ICD-10-CM | POA: Diagnosis not present

## 2014-12-28 DIAGNOSIS — D61818 Other pancytopenia: Secondary | ICD-10-CM | POA: Insufficient documentation

## 2014-12-28 DIAGNOSIS — I7090 Unspecified atherosclerosis: Secondary | ICD-10-CM

## 2014-12-28 DIAGNOSIS — I701 Atherosclerosis of renal artery: Secondary | ICD-10-CM | POA: Diagnosis not present

## 2014-12-28 DIAGNOSIS — Z741 Need for assistance with personal care: Secondary | ICD-10-CM | POA: Diagnosis not present

## 2014-12-28 DIAGNOSIS — I129 Hypertensive chronic kidney disease with stage 1 through stage 4 chronic kidney disease, or unspecified chronic kidney disease: Secondary | ICD-10-CM | POA: Diagnosis not present

## 2014-12-28 DIAGNOSIS — J441 Chronic obstructive pulmonary disease with (acute) exacerbation: Secondary | ICD-10-CM | POA: Diagnosis not present

## 2014-12-28 DIAGNOSIS — B348 Other viral infections of unspecified site: Secondary | ICD-10-CM | POA: Diagnosis not present

## 2014-12-28 DIAGNOSIS — Z4682 Encounter for fitting and adjustment of non-vascular catheter: Secondary | ICD-10-CM | POA: Diagnosis not present

## 2014-12-28 DIAGNOSIS — R9431 Abnormal electrocardiogram [ECG] [EKG]: Secondary | ICD-10-CM | POA: Diagnosis not present

## 2014-12-28 DIAGNOSIS — R1312 Dysphagia, oropharyngeal phase: Secondary | ICD-10-CM | POA: Diagnosis not present

## 2014-12-28 DIAGNOSIS — Y95 Nosocomial condition: Secondary | ICD-10-CM | POA: Diagnosis not present

## 2014-12-28 DIAGNOSIS — J9601 Acute respiratory failure with hypoxia: Secondary | ICD-10-CM | POA: Diagnosis not present

## 2014-12-28 DIAGNOSIS — J189 Pneumonia, unspecified organism: Secondary | ICD-10-CM | POA: Diagnosis not present

## 2014-12-28 DIAGNOSIS — R911 Solitary pulmonary nodule: Secondary | ICD-10-CM

## 2014-12-28 DIAGNOSIS — R633 Feeding difficulties: Secondary | ICD-10-CM | POA: Diagnosis not present

## 2014-12-28 DIAGNOSIS — D649 Anemia, unspecified: Secondary | ICD-10-CM | POA: Diagnosis not present

## 2014-12-28 DIAGNOSIS — J440 Chronic obstructive pulmonary disease with acute lower respiratory infection: Secondary | ICD-10-CM | POA: Diagnosis not present

## 2014-12-28 DIAGNOSIS — N182 Chronic kidney disease, stage 2 (mild): Secondary | ICD-10-CM | POA: Diagnosis not present

## 2014-12-28 DIAGNOSIS — R000 Tachycardia, unspecified: Secondary | ICD-10-CM | POA: Diagnosis not present

## 2014-12-28 DIAGNOSIS — J9610 Chronic respiratory failure, unspecified whether with hypoxia or hypercapnia: Secondary | ICD-10-CM | POA: Diagnosis not present

## 2014-12-28 DIAGNOSIS — Z7982 Long term (current) use of aspirin: Secondary | ICD-10-CM | POA: Diagnosis not present

## 2014-12-28 DIAGNOSIS — Z781 Physical restraint status: Secondary | ICD-10-CM | POA: Diagnosis not present

## 2014-12-28 DIAGNOSIS — R531 Weakness: Secondary | ICD-10-CM | POA: Diagnosis not present

## 2014-12-28 DIAGNOSIS — E1122 Type 2 diabetes mellitus with diabetic chronic kidney disease: Secondary | ICD-10-CM | POA: Diagnosis not present

## 2014-12-28 DIAGNOSIS — R222 Localized swelling, mass and lump, trunk: Secondary | ICD-10-CM | POA: Diagnosis not present

## 2014-12-28 DIAGNOSIS — Z87891 Personal history of nicotine dependence: Secondary | ICD-10-CM | POA: Diagnosis not present

## 2014-12-28 DIAGNOSIS — R490 Dysphonia: Secondary | ICD-10-CM | POA: Diagnosis not present

## 2014-12-28 DIAGNOSIS — R1310 Dysphagia, unspecified: Secondary | ICD-10-CM | POA: Diagnosis not present

## 2014-12-28 DIAGNOSIS — G523 Disorders of hypoglossal nerve: Secondary | ICD-10-CM | POA: Diagnosis not present

## 2014-12-28 DIAGNOSIS — J9622 Acute and chronic respiratory failure with hypercapnia: Secondary | ICD-10-CM | POA: Diagnosis not present

## 2014-12-28 DIAGNOSIS — J9621 Acute and chronic respiratory failure with hypoxia: Secondary | ICD-10-CM | POA: Insufficient documentation

## 2014-12-28 DIAGNOSIS — J3800 Paralysis of vocal cords and larynx, unspecified: Secondary | ICD-10-CM | POA: Diagnosis not present

## 2014-12-28 DIAGNOSIS — E441 Mild protein-calorie malnutrition: Secondary | ICD-10-CM

## 2014-12-28 DIAGNOSIS — M6281 Muscle weakness (generalized): Secondary | ICD-10-CM | POA: Diagnosis not present

## 2014-12-28 DIAGNOSIS — Z955 Presence of coronary angioplasty implant and graft: Secondary | ICD-10-CM | POA: Diagnosis not present

## 2014-12-28 DIAGNOSIS — R2689 Other abnormalities of gait and mobility: Secondary | ICD-10-CM | POA: Diagnosis not present

## 2014-12-28 DIAGNOSIS — D638 Anemia in other chronic diseases classified elsewhere: Secondary | ICD-10-CM | POA: Diagnosis not present

## 2014-12-28 DIAGNOSIS — N261 Atrophy of kidney (terminal): Secondary | ICD-10-CM | POA: Diagnosis not present

## 2014-12-28 HISTORY — DX: Chronic obstructive pulmonary disease, unspecified: J44.9

## 2014-12-28 HISTORY — DX: Solitary pulmonary nodule: R91.1

## 2014-12-28 HISTORY — DX: Other pancytopenia: D61.818

## 2014-12-28 HISTORY — DX: Mild protein-calorie malnutrition: E44.1

## 2014-12-28 HISTORY — DX: Unspecified atherosclerosis: I70.90

## 2014-12-28 HISTORY — DX: Atherosclerotic heart disease of native coronary artery without angina pectoris: I25.10

## 2014-12-30 DIAGNOSIS — B348 Other viral infections of unspecified site: Secondary | ICD-10-CM

## 2014-12-30 HISTORY — DX: Other viral infections of unspecified site: B34.8

## 2015-01-11 DIAGNOSIS — M6281 Muscle weakness (generalized): Secondary | ICD-10-CM | POA: Diagnosis not present

## 2015-01-11 DIAGNOSIS — E441 Mild protein-calorie malnutrition: Secondary | ICD-10-CM | POA: Diagnosis not present

## 2015-01-11 DIAGNOSIS — J449 Chronic obstructive pulmonary disease, unspecified: Secondary | ICD-10-CM | POA: Diagnosis not present

## 2015-01-11 DIAGNOSIS — R1312 Dysphagia, oropharyngeal phase: Secondary | ICD-10-CM | POA: Diagnosis not present

## 2015-01-11 DIAGNOSIS — Z741 Need for assistance with personal care: Secondary | ICD-10-CM | POA: Diagnosis not present

## 2015-01-11 DIAGNOSIS — J9610 Chronic respiratory failure, unspecified whether with hypoxia or hypercapnia: Secondary | ICD-10-CM | POA: Diagnosis not present

## 2015-01-11 DIAGNOSIS — D61818 Other pancytopenia: Secondary | ICD-10-CM | POA: Diagnosis not present

## 2015-01-11 DIAGNOSIS — I749 Embolism and thrombosis of unspecified artery: Secondary | ICD-10-CM | POA: Diagnosis not present

## 2015-01-11 DIAGNOSIS — I2510 Atherosclerotic heart disease of native coronary artery without angina pectoris: Secondary | ICD-10-CM | POA: Diagnosis not present

## 2015-01-11 DIAGNOSIS — I10 Essential (primary) hypertension: Secondary | ICD-10-CM | POA: Diagnosis not present

## 2015-01-11 DIAGNOSIS — E119 Type 2 diabetes mellitus without complications: Secondary | ICD-10-CM | POA: Diagnosis not present

## 2015-01-11 DIAGNOSIS — Z9981 Dependence on supplemental oxygen: Secondary | ICD-10-CM | POA: Diagnosis not present

## 2015-01-11 DIAGNOSIS — R2689 Other abnormalities of gait and mobility: Secondary | ICD-10-CM | POA: Diagnosis not present

## 2015-01-11 DIAGNOSIS — Z87891 Personal history of nicotine dependence: Secondary | ICD-10-CM | POA: Diagnosis not present

## 2015-01-11 DIAGNOSIS — J9621 Acute and chronic respiratory failure with hypoxia: Secondary | ICD-10-CM | POA: Diagnosis not present

## 2015-01-11 DIAGNOSIS — I252 Old myocardial infarction: Secondary | ICD-10-CM | POA: Diagnosis not present

## 2015-01-11 DIAGNOSIS — N261 Atrophy of kidney (terminal): Secondary | ICD-10-CM | POA: Diagnosis not present

## 2015-01-11 DIAGNOSIS — J189 Pneumonia, unspecified organism: Secondary | ICD-10-CM | POA: Diagnosis not present

## 2015-01-11 DIAGNOSIS — R911 Solitary pulmonary nodule: Secondary | ICD-10-CM | POA: Diagnosis not present

## 2015-01-19 DIAGNOSIS — Z7902 Long term (current) use of antithrombotics/antiplatelets: Secondary | ICD-10-CM | POA: Diagnosis not present

## 2015-01-19 DIAGNOSIS — Z9181 History of falling: Secondary | ICD-10-CM | POA: Diagnosis not present

## 2015-01-19 DIAGNOSIS — Z7982 Long term (current) use of aspirin: Secondary | ICD-10-CM | POA: Diagnosis not present

## 2015-01-19 DIAGNOSIS — Z86718 Personal history of other venous thrombosis and embolism: Secondary | ICD-10-CM | POA: Diagnosis not present

## 2015-01-19 DIAGNOSIS — I2510 Atherosclerotic heart disease of native coronary artery without angina pectoris: Secondary | ICD-10-CM | POA: Diagnosis not present

## 2015-01-19 DIAGNOSIS — I129 Hypertensive chronic kidney disease with stage 1 through stage 4 chronic kidney disease, or unspecified chronic kidney disease: Secondary | ICD-10-CM | POA: Diagnosis not present

## 2015-01-19 DIAGNOSIS — Z9981 Dependence on supplemental oxygen: Secondary | ICD-10-CM | POA: Diagnosis not present

## 2015-01-19 DIAGNOSIS — Z87891 Personal history of nicotine dependence: Secondary | ICD-10-CM | POA: Diagnosis not present

## 2015-01-19 DIAGNOSIS — N189 Chronic kidney disease, unspecified: Secondary | ICD-10-CM | POA: Diagnosis not present

## 2015-01-19 DIAGNOSIS — E119 Type 2 diabetes mellitus without complications: Secondary | ICD-10-CM | POA: Diagnosis not present

## 2015-01-19 DIAGNOSIS — J449 Chronic obstructive pulmonary disease, unspecified: Secondary | ICD-10-CM | POA: Diagnosis not present

## 2015-01-19 DIAGNOSIS — J9611 Chronic respiratory failure with hypoxia: Secondary | ICD-10-CM | POA: Diagnosis not present

## 2015-01-19 DIAGNOSIS — J441 Chronic obstructive pulmonary disease with (acute) exacerbation: Secondary | ICD-10-CM | POA: Diagnosis not present

## 2015-01-20 DIAGNOSIS — Z9181 History of falling: Secondary | ICD-10-CM | POA: Diagnosis not present

## 2015-01-20 DIAGNOSIS — Z7902 Long term (current) use of antithrombotics/antiplatelets: Secondary | ICD-10-CM | POA: Diagnosis not present

## 2015-01-20 DIAGNOSIS — J441 Chronic obstructive pulmonary disease with (acute) exacerbation: Secondary | ICD-10-CM | POA: Diagnosis not present

## 2015-01-20 DIAGNOSIS — Z87891 Personal history of nicotine dependence: Secondary | ICD-10-CM | POA: Diagnosis not present

## 2015-01-20 DIAGNOSIS — Z7982 Long term (current) use of aspirin: Secondary | ICD-10-CM | POA: Diagnosis not present

## 2015-01-20 DIAGNOSIS — I2510 Atherosclerotic heart disease of native coronary artery without angina pectoris: Secondary | ICD-10-CM | POA: Diagnosis not present

## 2015-01-20 DIAGNOSIS — I129 Hypertensive chronic kidney disease with stage 1 through stage 4 chronic kidney disease, or unspecified chronic kidney disease: Secondary | ICD-10-CM | POA: Diagnosis not present

## 2015-01-20 DIAGNOSIS — N189 Chronic kidney disease, unspecified: Secondary | ICD-10-CM | POA: Diagnosis not present

## 2015-01-20 DIAGNOSIS — J9611 Chronic respiratory failure with hypoxia: Secondary | ICD-10-CM | POA: Diagnosis not present

## 2015-01-20 DIAGNOSIS — Z86718 Personal history of other venous thrombosis and embolism: Secondary | ICD-10-CM | POA: Diagnosis not present

## 2015-01-20 DIAGNOSIS — Z9981 Dependence on supplemental oxygen: Secondary | ICD-10-CM | POA: Diagnosis not present

## 2015-01-20 DIAGNOSIS — E119 Type 2 diabetes mellitus without complications: Secondary | ICD-10-CM | POA: Diagnosis not present

## 2015-01-21 DIAGNOSIS — Z9181 History of falling: Secondary | ICD-10-CM | POA: Diagnosis not present

## 2015-01-21 DIAGNOSIS — N189 Chronic kidney disease, unspecified: Secondary | ICD-10-CM | POA: Diagnosis not present

## 2015-01-21 DIAGNOSIS — Z87891 Personal history of nicotine dependence: Secondary | ICD-10-CM | POA: Diagnosis not present

## 2015-01-21 DIAGNOSIS — Z7982 Long term (current) use of aspirin: Secondary | ICD-10-CM | POA: Diagnosis not present

## 2015-01-21 DIAGNOSIS — J441 Chronic obstructive pulmonary disease with (acute) exacerbation: Secondary | ICD-10-CM | POA: Diagnosis not present

## 2015-01-21 DIAGNOSIS — Z7902 Long term (current) use of antithrombotics/antiplatelets: Secondary | ICD-10-CM | POA: Diagnosis not present

## 2015-01-21 DIAGNOSIS — E119 Type 2 diabetes mellitus without complications: Secondary | ICD-10-CM | POA: Diagnosis not present

## 2015-01-21 DIAGNOSIS — I2510 Atherosclerotic heart disease of native coronary artery without angina pectoris: Secondary | ICD-10-CM | POA: Diagnosis not present

## 2015-01-21 DIAGNOSIS — J9611 Chronic respiratory failure with hypoxia: Secondary | ICD-10-CM | POA: Diagnosis not present

## 2015-01-21 DIAGNOSIS — Z86718 Personal history of other venous thrombosis and embolism: Secondary | ICD-10-CM | POA: Diagnosis not present

## 2015-01-21 DIAGNOSIS — Z9981 Dependence on supplemental oxygen: Secondary | ICD-10-CM | POA: Diagnosis not present

## 2015-01-21 DIAGNOSIS — I129 Hypertensive chronic kidney disease with stage 1 through stage 4 chronic kidney disease, or unspecified chronic kidney disease: Secondary | ICD-10-CM | POA: Diagnosis not present

## 2015-01-24 DIAGNOSIS — Z7982 Long term (current) use of aspirin: Secondary | ICD-10-CM | POA: Diagnosis not present

## 2015-01-24 DIAGNOSIS — J9611 Chronic respiratory failure with hypoxia: Secondary | ICD-10-CM | POA: Diagnosis not present

## 2015-01-24 DIAGNOSIS — Z7902 Long term (current) use of antithrombotics/antiplatelets: Secondary | ICD-10-CM | POA: Diagnosis not present

## 2015-01-24 DIAGNOSIS — Z9981 Dependence on supplemental oxygen: Secondary | ICD-10-CM | POA: Diagnosis not present

## 2015-01-24 DIAGNOSIS — J441 Chronic obstructive pulmonary disease with (acute) exacerbation: Secondary | ICD-10-CM | POA: Diagnosis not present

## 2015-01-24 DIAGNOSIS — E119 Type 2 diabetes mellitus without complications: Secondary | ICD-10-CM | POA: Diagnosis not present

## 2015-01-24 DIAGNOSIS — I2510 Atherosclerotic heart disease of native coronary artery without angina pectoris: Secondary | ICD-10-CM | POA: Diagnosis not present

## 2015-01-24 DIAGNOSIS — Z9181 History of falling: Secondary | ICD-10-CM | POA: Diagnosis not present

## 2015-01-24 DIAGNOSIS — Z86718 Personal history of other venous thrombosis and embolism: Secondary | ICD-10-CM | POA: Diagnosis not present

## 2015-01-24 DIAGNOSIS — N189 Chronic kidney disease, unspecified: Secondary | ICD-10-CM | POA: Diagnosis not present

## 2015-01-24 DIAGNOSIS — Z87891 Personal history of nicotine dependence: Secondary | ICD-10-CM | POA: Diagnosis not present

## 2015-01-24 DIAGNOSIS — I129 Hypertensive chronic kidney disease with stage 1 through stage 4 chronic kidney disease, or unspecified chronic kidney disease: Secondary | ICD-10-CM | POA: Diagnosis not present

## 2015-01-25 DIAGNOSIS — Z9981 Dependence on supplemental oxygen: Secondary | ICD-10-CM | POA: Diagnosis not present

## 2015-01-25 DIAGNOSIS — I129 Hypertensive chronic kidney disease with stage 1 through stage 4 chronic kidney disease, or unspecified chronic kidney disease: Secondary | ICD-10-CM | POA: Diagnosis not present

## 2015-01-25 DIAGNOSIS — I2510 Atherosclerotic heart disease of native coronary artery without angina pectoris: Secondary | ICD-10-CM | POA: Diagnosis not present

## 2015-01-25 DIAGNOSIS — J9611 Chronic respiratory failure with hypoxia: Secondary | ICD-10-CM | POA: Diagnosis not present

## 2015-01-25 DIAGNOSIS — Z87891 Personal history of nicotine dependence: Secondary | ICD-10-CM | POA: Diagnosis not present

## 2015-01-25 DIAGNOSIS — E119 Type 2 diabetes mellitus without complications: Secondary | ICD-10-CM | POA: Diagnosis not present

## 2015-01-25 DIAGNOSIS — Z9181 History of falling: Secondary | ICD-10-CM | POA: Diagnosis not present

## 2015-01-25 DIAGNOSIS — Z7902 Long term (current) use of antithrombotics/antiplatelets: Secondary | ICD-10-CM | POA: Diagnosis not present

## 2015-01-25 DIAGNOSIS — J441 Chronic obstructive pulmonary disease with (acute) exacerbation: Secondary | ICD-10-CM | POA: Diagnosis not present

## 2015-01-25 DIAGNOSIS — N189 Chronic kidney disease, unspecified: Secondary | ICD-10-CM | POA: Diagnosis not present

## 2015-01-25 DIAGNOSIS — Z7982 Long term (current) use of aspirin: Secondary | ICD-10-CM | POA: Diagnosis not present

## 2015-01-25 DIAGNOSIS — Z86718 Personal history of other venous thrombosis and embolism: Secondary | ICD-10-CM | POA: Diagnosis not present

## 2015-01-26 DIAGNOSIS — J9611 Chronic respiratory failure with hypoxia: Secondary | ICD-10-CM | POA: Diagnosis not present

## 2015-01-26 DIAGNOSIS — R05 Cough: Secondary | ICD-10-CM | POA: Diagnosis not present

## 2015-01-26 DIAGNOSIS — J441 Chronic obstructive pulmonary disease with (acute) exacerbation: Secondary | ICD-10-CM | POA: Diagnosis not present

## 2015-01-26 DIAGNOSIS — R0602 Shortness of breath: Secondary | ICD-10-CM | POA: Diagnosis not present

## 2015-01-27 DIAGNOSIS — Z9181 History of falling: Secondary | ICD-10-CM | POA: Diagnosis not present

## 2015-01-27 DIAGNOSIS — E119 Type 2 diabetes mellitus without complications: Secondary | ICD-10-CM | POA: Diagnosis not present

## 2015-01-27 DIAGNOSIS — Z7902 Long term (current) use of antithrombotics/antiplatelets: Secondary | ICD-10-CM | POA: Diagnosis not present

## 2015-01-27 DIAGNOSIS — I129 Hypertensive chronic kidney disease with stage 1 through stage 4 chronic kidney disease, or unspecified chronic kidney disease: Secondary | ICD-10-CM | POA: Diagnosis not present

## 2015-01-27 DIAGNOSIS — Z86718 Personal history of other venous thrombosis and embolism: Secondary | ICD-10-CM | POA: Diagnosis not present

## 2015-01-27 DIAGNOSIS — Z7982 Long term (current) use of aspirin: Secondary | ICD-10-CM | POA: Diagnosis not present

## 2015-01-27 DIAGNOSIS — I2510 Atherosclerotic heart disease of native coronary artery without angina pectoris: Secondary | ICD-10-CM | POA: Diagnosis not present

## 2015-01-27 DIAGNOSIS — J9611 Chronic respiratory failure with hypoxia: Secondary | ICD-10-CM | POA: Diagnosis not present

## 2015-01-27 DIAGNOSIS — Z87891 Personal history of nicotine dependence: Secondary | ICD-10-CM | POA: Diagnosis not present

## 2015-01-27 DIAGNOSIS — N189 Chronic kidney disease, unspecified: Secondary | ICD-10-CM | POA: Diagnosis not present

## 2015-01-27 DIAGNOSIS — Z9981 Dependence on supplemental oxygen: Secondary | ICD-10-CM | POA: Diagnosis not present

## 2015-01-27 DIAGNOSIS — J441 Chronic obstructive pulmonary disease with (acute) exacerbation: Secondary | ICD-10-CM | POA: Diagnosis not present

## 2015-01-28 DIAGNOSIS — I2510 Atherosclerotic heart disease of native coronary artery without angina pectoris: Secondary | ICD-10-CM | POA: Diagnosis not present

## 2015-01-28 DIAGNOSIS — Z9181 History of falling: Secondary | ICD-10-CM | POA: Diagnosis not present

## 2015-01-28 DIAGNOSIS — J441 Chronic obstructive pulmonary disease with (acute) exacerbation: Secondary | ICD-10-CM | POA: Diagnosis not present

## 2015-01-28 DIAGNOSIS — J9611 Chronic respiratory failure with hypoxia: Secondary | ICD-10-CM | POA: Diagnosis not present

## 2015-01-28 DIAGNOSIS — Z87891 Personal history of nicotine dependence: Secondary | ICD-10-CM | POA: Diagnosis not present

## 2015-01-28 DIAGNOSIS — Z86718 Personal history of other venous thrombosis and embolism: Secondary | ICD-10-CM | POA: Diagnosis not present

## 2015-01-28 DIAGNOSIS — Z9981 Dependence on supplemental oxygen: Secondary | ICD-10-CM | POA: Diagnosis not present

## 2015-01-28 DIAGNOSIS — Z7902 Long term (current) use of antithrombotics/antiplatelets: Secondary | ICD-10-CM | POA: Diagnosis not present

## 2015-01-28 DIAGNOSIS — I129 Hypertensive chronic kidney disease with stage 1 through stage 4 chronic kidney disease, or unspecified chronic kidney disease: Secondary | ICD-10-CM | POA: Diagnosis not present

## 2015-01-28 DIAGNOSIS — N189 Chronic kidney disease, unspecified: Secondary | ICD-10-CM | POA: Diagnosis not present

## 2015-01-28 DIAGNOSIS — E119 Type 2 diabetes mellitus without complications: Secondary | ICD-10-CM | POA: Diagnosis not present

## 2015-01-28 DIAGNOSIS — Z7982 Long term (current) use of aspirin: Secondary | ICD-10-CM | POA: Diagnosis not present

## 2015-01-31 DIAGNOSIS — Z9181 History of falling: Secondary | ICD-10-CM | POA: Diagnosis not present

## 2015-01-31 DIAGNOSIS — Z7982 Long term (current) use of aspirin: Secondary | ICD-10-CM | POA: Diagnosis not present

## 2015-01-31 DIAGNOSIS — Z7902 Long term (current) use of antithrombotics/antiplatelets: Secondary | ICD-10-CM | POA: Diagnosis not present

## 2015-01-31 DIAGNOSIS — E119 Type 2 diabetes mellitus without complications: Secondary | ICD-10-CM | POA: Diagnosis not present

## 2015-01-31 DIAGNOSIS — N189 Chronic kidney disease, unspecified: Secondary | ICD-10-CM | POA: Diagnosis not present

## 2015-01-31 DIAGNOSIS — Z87891 Personal history of nicotine dependence: Secondary | ICD-10-CM | POA: Diagnosis not present

## 2015-01-31 DIAGNOSIS — I2510 Atherosclerotic heart disease of native coronary artery without angina pectoris: Secondary | ICD-10-CM | POA: Diagnosis not present

## 2015-01-31 DIAGNOSIS — Z86718 Personal history of other venous thrombosis and embolism: Secondary | ICD-10-CM | POA: Diagnosis not present

## 2015-01-31 DIAGNOSIS — J441 Chronic obstructive pulmonary disease with (acute) exacerbation: Secondary | ICD-10-CM | POA: Diagnosis not present

## 2015-01-31 DIAGNOSIS — Z9981 Dependence on supplemental oxygen: Secondary | ICD-10-CM | POA: Diagnosis not present

## 2015-01-31 DIAGNOSIS — I129 Hypertensive chronic kidney disease with stage 1 through stage 4 chronic kidney disease, or unspecified chronic kidney disease: Secondary | ICD-10-CM | POA: Diagnosis not present

## 2015-01-31 DIAGNOSIS — J9611 Chronic respiratory failure with hypoxia: Secondary | ICD-10-CM | POA: Diagnosis not present

## 2015-02-01 DIAGNOSIS — J441 Chronic obstructive pulmonary disease with (acute) exacerbation: Secondary | ICD-10-CM | POA: Diagnosis not present

## 2015-02-01 DIAGNOSIS — I2510 Atherosclerotic heart disease of native coronary artery without angina pectoris: Secondary | ICD-10-CM | POA: Diagnosis not present

## 2015-02-01 DIAGNOSIS — Z9181 History of falling: Secondary | ICD-10-CM | POA: Diagnosis not present

## 2015-02-01 DIAGNOSIS — E119 Type 2 diabetes mellitus without complications: Secondary | ICD-10-CM | POA: Diagnosis not present

## 2015-02-01 DIAGNOSIS — Z9981 Dependence on supplemental oxygen: Secondary | ICD-10-CM | POA: Diagnosis not present

## 2015-02-01 DIAGNOSIS — I129 Hypertensive chronic kidney disease with stage 1 through stage 4 chronic kidney disease, or unspecified chronic kidney disease: Secondary | ICD-10-CM | POA: Diagnosis not present

## 2015-02-01 DIAGNOSIS — J9611 Chronic respiratory failure with hypoxia: Secondary | ICD-10-CM | POA: Diagnosis not present

## 2015-02-01 DIAGNOSIS — Z7982 Long term (current) use of aspirin: Secondary | ICD-10-CM | POA: Diagnosis not present

## 2015-02-01 DIAGNOSIS — Z7902 Long term (current) use of antithrombotics/antiplatelets: Secondary | ICD-10-CM | POA: Diagnosis not present

## 2015-02-01 DIAGNOSIS — Z86718 Personal history of other venous thrombosis and embolism: Secondary | ICD-10-CM | POA: Diagnosis not present

## 2015-02-01 DIAGNOSIS — N189 Chronic kidney disease, unspecified: Secondary | ICD-10-CM | POA: Diagnosis not present

## 2015-02-01 DIAGNOSIS — Z87891 Personal history of nicotine dependence: Secondary | ICD-10-CM | POA: Diagnosis not present

## 2015-02-02 DIAGNOSIS — Z7902 Long term (current) use of antithrombotics/antiplatelets: Secondary | ICD-10-CM | POA: Diagnosis not present

## 2015-02-02 DIAGNOSIS — Z9981 Dependence on supplemental oxygen: Secondary | ICD-10-CM | POA: Diagnosis not present

## 2015-02-02 DIAGNOSIS — Z87891 Personal history of nicotine dependence: Secondary | ICD-10-CM | POA: Diagnosis not present

## 2015-02-02 DIAGNOSIS — I129 Hypertensive chronic kidney disease with stage 1 through stage 4 chronic kidney disease, or unspecified chronic kidney disease: Secondary | ICD-10-CM | POA: Diagnosis not present

## 2015-02-02 DIAGNOSIS — E119 Type 2 diabetes mellitus without complications: Secondary | ICD-10-CM | POA: Diagnosis not present

## 2015-02-02 DIAGNOSIS — Z7982 Long term (current) use of aspirin: Secondary | ICD-10-CM | POA: Diagnosis not present

## 2015-02-02 DIAGNOSIS — N189 Chronic kidney disease, unspecified: Secondary | ICD-10-CM | POA: Diagnosis not present

## 2015-02-02 DIAGNOSIS — J9611 Chronic respiratory failure with hypoxia: Secondary | ICD-10-CM | POA: Diagnosis not present

## 2015-02-02 DIAGNOSIS — J441 Chronic obstructive pulmonary disease with (acute) exacerbation: Secondary | ICD-10-CM | POA: Diagnosis not present

## 2015-02-02 DIAGNOSIS — I2510 Atherosclerotic heart disease of native coronary artery without angina pectoris: Secondary | ICD-10-CM | POA: Diagnosis not present

## 2015-02-02 DIAGNOSIS — Z86718 Personal history of other venous thrombosis and embolism: Secondary | ICD-10-CM | POA: Diagnosis not present

## 2015-02-02 DIAGNOSIS — Z9181 History of falling: Secondary | ICD-10-CM | POA: Diagnosis not present

## 2015-02-03 DIAGNOSIS — Z87891 Personal history of nicotine dependence: Secondary | ICD-10-CM | POA: Diagnosis not present

## 2015-02-03 DIAGNOSIS — N189 Chronic kidney disease, unspecified: Secondary | ICD-10-CM | POA: Diagnosis not present

## 2015-02-03 DIAGNOSIS — J441 Chronic obstructive pulmonary disease with (acute) exacerbation: Secondary | ICD-10-CM | POA: Diagnosis not present

## 2015-02-03 DIAGNOSIS — Z86718 Personal history of other venous thrombosis and embolism: Secondary | ICD-10-CM | POA: Diagnosis not present

## 2015-02-03 DIAGNOSIS — I2510 Atherosclerotic heart disease of native coronary artery without angina pectoris: Secondary | ICD-10-CM | POA: Diagnosis not present

## 2015-02-03 DIAGNOSIS — Z9981 Dependence on supplemental oxygen: Secondary | ICD-10-CM | POA: Diagnosis not present

## 2015-02-03 DIAGNOSIS — Z9181 History of falling: Secondary | ICD-10-CM | POA: Diagnosis not present

## 2015-02-03 DIAGNOSIS — I129 Hypertensive chronic kidney disease with stage 1 through stage 4 chronic kidney disease, or unspecified chronic kidney disease: Secondary | ICD-10-CM | POA: Diagnosis not present

## 2015-02-03 DIAGNOSIS — E119 Type 2 diabetes mellitus without complications: Secondary | ICD-10-CM | POA: Diagnosis not present

## 2015-02-03 DIAGNOSIS — Z7982 Long term (current) use of aspirin: Secondary | ICD-10-CM | POA: Diagnosis not present

## 2015-02-03 DIAGNOSIS — Z7902 Long term (current) use of antithrombotics/antiplatelets: Secondary | ICD-10-CM | POA: Diagnosis not present

## 2015-02-03 DIAGNOSIS — J9611 Chronic respiratory failure with hypoxia: Secondary | ICD-10-CM | POA: Diagnosis not present

## 2015-02-04 DIAGNOSIS — Z7902 Long term (current) use of antithrombotics/antiplatelets: Secondary | ICD-10-CM | POA: Diagnosis not present

## 2015-02-04 DIAGNOSIS — Z9981 Dependence on supplemental oxygen: Secondary | ICD-10-CM | POA: Diagnosis not present

## 2015-02-04 DIAGNOSIS — E119 Type 2 diabetes mellitus without complications: Secondary | ICD-10-CM | POA: Diagnosis not present

## 2015-02-04 DIAGNOSIS — Z86718 Personal history of other venous thrombosis and embolism: Secondary | ICD-10-CM | POA: Diagnosis not present

## 2015-02-04 DIAGNOSIS — I129 Hypertensive chronic kidney disease with stage 1 through stage 4 chronic kidney disease, or unspecified chronic kidney disease: Secondary | ICD-10-CM | POA: Diagnosis not present

## 2015-02-04 DIAGNOSIS — N189 Chronic kidney disease, unspecified: Secondary | ICD-10-CM | POA: Diagnosis not present

## 2015-02-04 DIAGNOSIS — Z7982 Long term (current) use of aspirin: Secondary | ICD-10-CM | POA: Diagnosis not present

## 2015-02-04 DIAGNOSIS — Z9181 History of falling: Secondary | ICD-10-CM | POA: Diagnosis not present

## 2015-02-04 DIAGNOSIS — J9611 Chronic respiratory failure with hypoxia: Secondary | ICD-10-CM | POA: Diagnosis not present

## 2015-02-04 DIAGNOSIS — Z87891 Personal history of nicotine dependence: Secondary | ICD-10-CM | POA: Diagnosis not present

## 2015-02-04 DIAGNOSIS — I2510 Atherosclerotic heart disease of native coronary artery without angina pectoris: Secondary | ICD-10-CM | POA: Diagnosis not present

## 2015-02-04 DIAGNOSIS — J441 Chronic obstructive pulmonary disease with (acute) exacerbation: Secondary | ICD-10-CM | POA: Diagnosis not present

## 2015-02-08 DIAGNOSIS — N189 Chronic kidney disease, unspecified: Secondary | ICD-10-CM | POA: Diagnosis not present

## 2015-02-08 DIAGNOSIS — J9611 Chronic respiratory failure with hypoxia: Secondary | ICD-10-CM | POA: Diagnosis not present

## 2015-02-08 DIAGNOSIS — J441 Chronic obstructive pulmonary disease with (acute) exacerbation: Secondary | ICD-10-CM | POA: Diagnosis not present

## 2015-02-08 DIAGNOSIS — Z7902 Long term (current) use of antithrombotics/antiplatelets: Secondary | ICD-10-CM | POA: Diagnosis not present

## 2015-02-08 DIAGNOSIS — Z87891 Personal history of nicotine dependence: Secondary | ICD-10-CM | POA: Diagnosis not present

## 2015-02-08 DIAGNOSIS — E119 Type 2 diabetes mellitus without complications: Secondary | ICD-10-CM | POA: Diagnosis not present

## 2015-02-08 DIAGNOSIS — Z86718 Personal history of other venous thrombosis and embolism: Secondary | ICD-10-CM | POA: Diagnosis not present

## 2015-02-08 DIAGNOSIS — Z9981 Dependence on supplemental oxygen: Secondary | ICD-10-CM | POA: Diagnosis not present

## 2015-02-08 DIAGNOSIS — I2510 Atherosclerotic heart disease of native coronary artery without angina pectoris: Secondary | ICD-10-CM | POA: Diagnosis not present

## 2015-02-08 DIAGNOSIS — Z7982 Long term (current) use of aspirin: Secondary | ICD-10-CM | POA: Diagnosis not present

## 2015-02-08 DIAGNOSIS — I129 Hypertensive chronic kidney disease with stage 1 through stage 4 chronic kidney disease, or unspecified chronic kidney disease: Secondary | ICD-10-CM | POA: Diagnosis not present

## 2015-02-08 DIAGNOSIS — Z9181 History of falling: Secondary | ICD-10-CM | POA: Diagnosis not present

## 2015-02-09 DIAGNOSIS — J441 Chronic obstructive pulmonary disease with (acute) exacerbation: Secondary | ICD-10-CM | POA: Diagnosis not present

## 2015-02-10 DIAGNOSIS — Z87891 Personal history of nicotine dependence: Secondary | ICD-10-CM | POA: Diagnosis not present

## 2015-02-10 DIAGNOSIS — Z86718 Personal history of other venous thrombosis and embolism: Secondary | ICD-10-CM | POA: Diagnosis not present

## 2015-02-10 DIAGNOSIS — I2510 Atherosclerotic heart disease of native coronary artery without angina pectoris: Secondary | ICD-10-CM | POA: Diagnosis not present

## 2015-02-10 DIAGNOSIS — N189 Chronic kidney disease, unspecified: Secondary | ICD-10-CM | POA: Diagnosis not present

## 2015-02-10 DIAGNOSIS — J441 Chronic obstructive pulmonary disease with (acute) exacerbation: Secondary | ICD-10-CM | POA: Diagnosis not present

## 2015-02-10 DIAGNOSIS — J9611 Chronic respiratory failure with hypoxia: Secondary | ICD-10-CM | POA: Diagnosis not present

## 2015-02-10 DIAGNOSIS — E119 Type 2 diabetes mellitus without complications: Secondary | ICD-10-CM | POA: Diagnosis not present

## 2015-02-10 DIAGNOSIS — Z7902 Long term (current) use of antithrombotics/antiplatelets: Secondary | ICD-10-CM | POA: Diagnosis not present

## 2015-02-10 DIAGNOSIS — Z9181 History of falling: Secondary | ICD-10-CM | POA: Diagnosis not present

## 2015-02-10 DIAGNOSIS — I129 Hypertensive chronic kidney disease with stage 1 through stage 4 chronic kidney disease, or unspecified chronic kidney disease: Secondary | ICD-10-CM | POA: Diagnosis not present

## 2015-02-10 DIAGNOSIS — Z9981 Dependence on supplemental oxygen: Secondary | ICD-10-CM | POA: Diagnosis not present

## 2015-02-10 DIAGNOSIS — Z7982 Long term (current) use of aspirin: Secondary | ICD-10-CM | POA: Diagnosis not present

## 2015-02-14 DIAGNOSIS — Z7982 Long term (current) use of aspirin: Secondary | ICD-10-CM | POA: Diagnosis not present

## 2015-02-14 DIAGNOSIS — N189 Chronic kidney disease, unspecified: Secondary | ICD-10-CM | POA: Diagnosis not present

## 2015-02-14 DIAGNOSIS — Z9981 Dependence on supplemental oxygen: Secondary | ICD-10-CM | POA: Diagnosis not present

## 2015-02-14 DIAGNOSIS — J9611 Chronic respiratory failure with hypoxia: Secondary | ICD-10-CM | POA: Diagnosis not present

## 2015-02-14 DIAGNOSIS — I2510 Atherosclerotic heart disease of native coronary artery without angina pectoris: Secondary | ICD-10-CM | POA: Diagnosis not present

## 2015-02-14 DIAGNOSIS — Z7902 Long term (current) use of antithrombotics/antiplatelets: Secondary | ICD-10-CM | POA: Diagnosis not present

## 2015-02-14 DIAGNOSIS — E119 Type 2 diabetes mellitus without complications: Secondary | ICD-10-CM | POA: Diagnosis not present

## 2015-02-14 DIAGNOSIS — I129 Hypertensive chronic kidney disease with stage 1 through stage 4 chronic kidney disease, or unspecified chronic kidney disease: Secondary | ICD-10-CM | POA: Diagnosis not present

## 2015-02-14 DIAGNOSIS — Z87891 Personal history of nicotine dependence: Secondary | ICD-10-CM | POA: Diagnosis not present

## 2015-02-14 DIAGNOSIS — Z9181 History of falling: Secondary | ICD-10-CM | POA: Diagnosis not present

## 2015-02-14 DIAGNOSIS — Z86718 Personal history of other venous thrombosis and embolism: Secondary | ICD-10-CM | POA: Diagnosis not present

## 2015-02-14 DIAGNOSIS — J441 Chronic obstructive pulmonary disease with (acute) exacerbation: Secondary | ICD-10-CM | POA: Diagnosis not present

## 2015-02-15 DIAGNOSIS — Z9981 Dependence on supplemental oxygen: Secondary | ICD-10-CM | POA: Diagnosis not present

## 2015-02-15 DIAGNOSIS — J441 Chronic obstructive pulmonary disease with (acute) exacerbation: Secondary | ICD-10-CM | POA: Diagnosis not present

## 2015-02-15 DIAGNOSIS — R0600 Dyspnea, unspecified: Secondary | ICD-10-CM | POA: Diagnosis not present

## 2015-02-15 DIAGNOSIS — Z9181 History of falling: Secondary | ICD-10-CM | POA: Diagnosis not present

## 2015-02-15 DIAGNOSIS — J9611 Chronic respiratory failure with hypoxia: Secondary | ICD-10-CM | POA: Diagnosis not present

## 2015-02-15 DIAGNOSIS — I129 Hypertensive chronic kidney disease with stage 1 through stage 4 chronic kidney disease, or unspecified chronic kidney disease: Secondary | ICD-10-CM | POA: Diagnosis not present

## 2015-02-15 DIAGNOSIS — J439 Emphysema, unspecified: Secondary | ICD-10-CM | POA: Diagnosis not present

## 2015-02-15 DIAGNOSIS — Z7982 Long term (current) use of aspirin: Secondary | ICD-10-CM | POA: Diagnosis not present

## 2015-02-15 DIAGNOSIS — Z7902 Long term (current) use of antithrombotics/antiplatelets: Secondary | ICD-10-CM | POA: Diagnosis not present

## 2015-02-15 DIAGNOSIS — E119 Type 2 diabetes mellitus without complications: Secondary | ICD-10-CM | POA: Diagnosis not present

## 2015-02-15 DIAGNOSIS — Z87891 Personal history of nicotine dependence: Secondary | ICD-10-CM | POA: Diagnosis not present

## 2015-02-15 DIAGNOSIS — N189 Chronic kidney disease, unspecified: Secondary | ICD-10-CM | POA: Diagnosis not present

## 2015-02-15 DIAGNOSIS — I2510 Atherosclerotic heart disease of native coronary artery without angina pectoris: Secondary | ICD-10-CM | POA: Diagnosis not present

## 2015-02-15 DIAGNOSIS — Z86718 Personal history of other venous thrombosis and embolism: Secondary | ICD-10-CM | POA: Diagnosis not present

## 2015-02-15 DIAGNOSIS — J449 Chronic obstructive pulmonary disease, unspecified: Secondary | ICD-10-CM | POA: Diagnosis not present

## 2015-02-16 DIAGNOSIS — Z86718 Personal history of other venous thrombosis and embolism: Secondary | ICD-10-CM | POA: Diagnosis not present

## 2015-02-16 DIAGNOSIS — Z87891 Personal history of nicotine dependence: Secondary | ICD-10-CM | POA: Insufficient documentation

## 2015-02-16 DIAGNOSIS — I129 Hypertensive chronic kidney disease with stage 1 through stage 4 chronic kidney disease, or unspecified chronic kidney disease: Secondary | ICD-10-CM | POA: Diagnosis not present

## 2015-02-16 DIAGNOSIS — Z7902 Long term (current) use of antithrombotics/antiplatelets: Secondary | ICD-10-CM | POA: Diagnosis not present

## 2015-02-16 DIAGNOSIS — E119 Type 2 diabetes mellitus without complications: Secondary | ICD-10-CM | POA: Diagnosis not present

## 2015-02-16 DIAGNOSIS — N189 Chronic kidney disease, unspecified: Secondary | ICD-10-CM | POA: Diagnosis not present

## 2015-02-16 DIAGNOSIS — J9611 Chronic respiratory failure with hypoxia: Secondary | ICD-10-CM

## 2015-02-16 DIAGNOSIS — Z7982 Long term (current) use of aspirin: Secondary | ICD-10-CM | POA: Diagnosis not present

## 2015-02-16 DIAGNOSIS — I2510 Atherosclerotic heart disease of native coronary artery without angina pectoris: Secondary | ICD-10-CM | POA: Diagnosis not present

## 2015-02-16 DIAGNOSIS — Z9181 History of falling: Secondary | ICD-10-CM | POA: Diagnosis not present

## 2015-02-16 DIAGNOSIS — J441 Chronic obstructive pulmonary disease with (acute) exacerbation: Secondary | ICD-10-CM | POA: Diagnosis not present

## 2015-02-16 DIAGNOSIS — Z9981 Dependence on supplemental oxygen: Secondary | ICD-10-CM | POA: Diagnosis not present

## 2015-02-16 HISTORY — DX: Chronic respiratory failure with hypoxia: J96.11

## 2015-02-16 HISTORY — DX: Personal history of nicotine dependence: Z87.891

## 2015-02-17 DIAGNOSIS — Z86718 Personal history of other venous thrombosis and embolism: Secondary | ICD-10-CM | POA: Diagnosis not present

## 2015-02-17 DIAGNOSIS — Z7982 Long term (current) use of aspirin: Secondary | ICD-10-CM | POA: Diagnosis not present

## 2015-02-17 DIAGNOSIS — I2510 Atherosclerotic heart disease of native coronary artery without angina pectoris: Secondary | ICD-10-CM | POA: Diagnosis not present

## 2015-02-17 DIAGNOSIS — I129 Hypertensive chronic kidney disease with stage 1 through stage 4 chronic kidney disease, or unspecified chronic kidney disease: Secondary | ICD-10-CM | POA: Diagnosis not present

## 2015-02-17 DIAGNOSIS — J9611 Chronic respiratory failure with hypoxia: Secondary | ICD-10-CM | POA: Diagnosis not present

## 2015-02-17 DIAGNOSIS — Z9981 Dependence on supplemental oxygen: Secondary | ICD-10-CM | POA: Diagnosis not present

## 2015-02-17 DIAGNOSIS — Z9181 History of falling: Secondary | ICD-10-CM | POA: Diagnosis not present

## 2015-02-17 DIAGNOSIS — Z7902 Long term (current) use of antithrombotics/antiplatelets: Secondary | ICD-10-CM | POA: Diagnosis not present

## 2015-02-17 DIAGNOSIS — J441 Chronic obstructive pulmonary disease with (acute) exacerbation: Secondary | ICD-10-CM | POA: Diagnosis not present

## 2015-02-17 DIAGNOSIS — N189 Chronic kidney disease, unspecified: Secondary | ICD-10-CM | POA: Diagnosis not present

## 2015-02-17 DIAGNOSIS — Z87891 Personal history of nicotine dependence: Secondary | ICD-10-CM | POA: Diagnosis not present

## 2015-02-17 DIAGNOSIS — E119 Type 2 diabetes mellitus without complications: Secondary | ICD-10-CM | POA: Diagnosis not present

## 2015-02-18 DIAGNOSIS — J441 Chronic obstructive pulmonary disease with (acute) exacerbation: Secondary | ICD-10-CM | POA: Diagnosis not present

## 2015-02-18 DIAGNOSIS — Z87891 Personal history of nicotine dependence: Secondary | ICD-10-CM | POA: Diagnosis not present

## 2015-02-18 DIAGNOSIS — J9611 Chronic respiratory failure with hypoxia: Secondary | ICD-10-CM | POA: Diagnosis not present

## 2015-02-18 DIAGNOSIS — Z9981 Dependence on supplemental oxygen: Secondary | ICD-10-CM | POA: Diagnosis not present

## 2015-02-18 DIAGNOSIS — Z9181 History of falling: Secondary | ICD-10-CM | POA: Diagnosis not present

## 2015-02-18 DIAGNOSIS — Z86718 Personal history of other venous thrombosis and embolism: Secondary | ICD-10-CM | POA: Diagnosis not present

## 2015-02-18 DIAGNOSIS — Z7982 Long term (current) use of aspirin: Secondary | ICD-10-CM | POA: Diagnosis not present

## 2015-02-18 DIAGNOSIS — I129 Hypertensive chronic kidney disease with stage 1 through stage 4 chronic kidney disease, or unspecified chronic kidney disease: Secondary | ICD-10-CM | POA: Diagnosis not present

## 2015-02-18 DIAGNOSIS — E119 Type 2 diabetes mellitus without complications: Secondary | ICD-10-CM | POA: Diagnosis not present

## 2015-02-18 DIAGNOSIS — I2510 Atherosclerotic heart disease of native coronary artery without angina pectoris: Secondary | ICD-10-CM | POA: Diagnosis not present

## 2015-02-18 DIAGNOSIS — Z7902 Long term (current) use of antithrombotics/antiplatelets: Secondary | ICD-10-CM | POA: Diagnosis not present

## 2015-02-18 DIAGNOSIS — N189 Chronic kidney disease, unspecified: Secondary | ICD-10-CM | POA: Diagnosis not present

## 2015-02-19 DIAGNOSIS — J449 Chronic obstructive pulmonary disease, unspecified: Secondary | ICD-10-CM | POA: Diagnosis not present

## 2015-02-21 DIAGNOSIS — J441 Chronic obstructive pulmonary disease with (acute) exacerbation: Secondary | ICD-10-CM | POA: Diagnosis not present

## 2015-02-21 DIAGNOSIS — R5383 Other fatigue: Secondary | ICD-10-CM | POA: Diagnosis not present

## 2015-02-22 DIAGNOSIS — Z87891 Personal history of nicotine dependence: Secondary | ICD-10-CM | POA: Diagnosis not present

## 2015-02-22 DIAGNOSIS — N189 Chronic kidney disease, unspecified: Secondary | ICD-10-CM | POA: Diagnosis not present

## 2015-02-22 DIAGNOSIS — J441 Chronic obstructive pulmonary disease with (acute) exacerbation: Secondary | ICD-10-CM | POA: Diagnosis not present

## 2015-02-22 DIAGNOSIS — I129 Hypertensive chronic kidney disease with stage 1 through stage 4 chronic kidney disease, or unspecified chronic kidney disease: Secondary | ICD-10-CM | POA: Diagnosis not present

## 2015-02-22 DIAGNOSIS — Z9181 History of falling: Secondary | ICD-10-CM | POA: Diagnosis not present

## 2015-02-22 DIAGNOSIS — J9611 Chronic respiratory failure with hypoxia: Secondary | ICD-10-CM | POA: Diagnosis not present

## 2015-02-22 DIAGNOSIS — Z7902 Long term (current) use of antithrombotics/antiplatelets: Secondary | ICD-10-CM | POA: Diagnosis not present

## 2015-02-22 DIAGNOSIS — Z9981 Dependence on supplemental oxygen: Secondary | ICD-10-CM | POA: Diagnosis not present

## 2015-02-22 DIAGNOSIS — Z7982 Long term (current) use of aspirin: Secondary | ICD-10-CM | POA: Diagnosis not present

## 2015-02-22 DIAGNOSIS — Z86718 Personal history of other venous thrombosis and embolism: Secondary | ICD-10-CM | POA: Diagnosis not present

## 2015-02-22 DIAGNOSIS — E119 Type 2 diabetes mellitus without complications: Secondary | ICD-10-CM | POA: Diagnosis not present

## 2015-02-22 DIAGNOSIS — I2510 Atherosclerotic heart disease of native coronary artery without angina pectoris: Secondary | ICD-10-CM | POA: Diagnosis not present

## 2015-02-23 DIAGNOSIS — Z87891 Personal history of nicotine dependence: Secondary | ICD-10-CM | POA: Diagnosis not present

## 2015-02-23 DIAGNOSIS — N189 Chronic kidney disease, unspecified: Secondary | ICD-10-CM | POA: Diagnosis not present

## 2015-02-23 DIAGNOSIS — Z86718 Personal history of other venous thrombosis and embolism: Secondary | ICD-10-CM | POA: Diagnosis not present

## 2015-02-23 DIAGNOSIS — Z9981 Dependence on supplemental oxygen: Secondary | ICD-10-CM | POA: Diagnosis not present

## 2015-02-23 DIAGNOSIS — E119 Type 2 diabetes mellitus without complications: Secondary | ICD-10-CM | POA: Diagnosis not present

## 2015-02-23 DIAGNOSIS — I129 Hypertensive chronic kidney disease with stage 1 through stage 4 chronic kidney disease, or unspecified chronic kidney disease: Secondary | ICD-10-CM | POA: Diagnosis not present

## 2015-02-23 DIAGNOSIS — Z7982 Long term (current) use of aspirin: Secondary | ICD-10-CM | POA: Diagnosis not present

## 2015-02-23 DIAGNOSIS — Z9181 History of falling: Secondary | ICD-10-CM | POA: Diagnosis not present

## 2015-02-23 DIAGNOSIS — I2510 Atherosclerotic heart disease of native coronary artery without angina pectoris: Secondary | ICD-10-CM | POA: Diagnosis not present

## 2015-02-23 DIAGNOSIS — J9611 Chronic respiratory failure with hypoxia: Secondary | ICD-10-CM | POA: Diagnosis not present

## 2015-02-23 DIAGNOSIS — Z7902 Long term (current) use of antithrombotics/antiplatelets: Secondary | ICD-10-CM | POA: Diagnosis not present

## 2015-02-23 DIAGNOSIS — J441 Chronic obstructive pulmonary disease with (acute) exacerbation: Secondary | ICD-10-CM | POA: Diagnosis not present

## 2015-02-24 DIAGNOSIS — I2510 Atherosclerotic heart disease of native coronary artery without angina pectoris: Secondary | ICD-10-CM | POA: Diagnosis not present

## 2015-02-24 DIAGNOSIS — I129 Hypertensive chronic kidney disease with stage 1 through stage 4 chronic kidney disease, or unspecified chronic kidney disease: Secondary | ICD-10-CM | POA: Diagnosis not present

## 2015-02-24 DIAGNOSIS — Z87891 Personal history of nicotine dependence: Secondary | ICD-10-CM | POA: Diagnosis not present

## 2015-02-24 DIAGNOSIS — Z9981 Dependence on supplemental oxygen: Secondary | ICD-10-CM | POA: Diagnosis not present

## 2015-02-24 DIAGNOSIS — J9611 Chronic respiratory failure with hypoxia: Secondary | ICD-10-CM | POA: Diagnosis not present

## 2015-02-24 DIAGNOSIS — N189 Chronic kidney disease, unspecified: Secondary | ICD-10-CM | POA: Diagnosis not present

## 2015-02-24 DIAGNOSIS — Z9181 History of falling: Secondary | ICD-10-CM | POA: Diagnosis not present

## 2015-02-24 DIAGNOSIS — E119 Type 2 diabetes mellitus without complications: Secondary | ICD-10-CM | POA: Diagnosis not present

## 2015-02-24 DIAGNOSIS — J441 Chronic obstructive pulmonary disease with (acute) exacerbation: Secondary | ICD-10-CM | POA: Diagnosis not present

## 2015-02-24 DIAGNOSIS — Z7902 Long term (current) use of antithrombotics/antiplatelets: Secondary | ICD-10-CM | POA: Diagnosis not present

## 2015-02-24 DIAGNOSIS — Z7982 Long term (current) use of aspirin: Secondary | ICD-10-CM | POA: Diagnosis not present

## 2015-02-24 DIAGNOSIS — Z86718 Personal history of other venous thrombosis and embolism: Secondary | ICD-10-CM | POA: Diagnosis not present

## 2015-02-25 DIAGNOSIS — I129 Hypertensive chronic kidney disease with stage 1 through stage 4 chronic kidney disease, or unspecified chronic kidney disease: Secondary | ICD-10-CM | POA: Diagnosis not present

## 2015-02-25 DIAGNOSIS — Z7902 Long term (current) use of antithrombotics/antiplatelets: Secondary | ICD-10-CM | POA: Diagnosis not present

## 2015-02-25 DIAGNOSIS — Z7982 Long term (current) use of aspirin: Secondary | ICD-10-CM | POA: Diagnosis not present

## 2015-02-25 DIAGNOSIS — J9611 Chronic respiratory failure with hypoxia: Secondary | ICD-10-CM | POA: Diagnosis not present

## 2015-02-25 DIAGNOSIS — J441 Chronic obstructive pulmonary disease with (acute) exacerbation: Secondary | ICD-10-CM | POA: Diagnosis not present

## 2015-02-25 DIAGNOSIS — Z86718 Personal history of other venous thrombosis and embolism: Secondary | ICD-10-CM | POA: Diagnosis not present

## 2015-02-25 DIAGNOSIS — Z87891 Personal history of nicotine dependence: Secondary | ICD-10-CM | POA: Diagnosis not present

## 2015-02-25 DIAGNOSIS — Z9181 History of falling: Secondary | ICD-10-CM | POA: Diagnosis not present

## 2015-02-25 DIAGNOSIS — N189 Chronic kidney disease, unspecified: Secondary | ICD-10-CM | POA: Diagnosis not present

## 2015-02-25 DIAGNOSIS — E119 Type 2 diabetes mellitus without complications: Secondary | ICD-10-CM | POA: Diagnosis not present

## 2015-02-25 DIAGNOSIS — I2510 Atherosclerotic heart disease of native coronary artery without angina pectoris: Secondary | ICD-10-CM | POA: Diagnosis not present

## 2015-02-25 DIAGNOSIS — Z9981 Dependence on supplemental oxygen: Secondary | ICD-10-CM | POA: Diagnosis not present

## 2015-03-01 DIAGNOSIS — Z86718 Personal history of other venous thrombosis and embolism: Secondary | ICD-10-CM | POA: Diagnosis not present

## 2015-03-01 DIAGNOSIS — J9611 Chronic respiratory failure with hypoxia: Secondary | ICD-10-CM | POA: Diagnosis not present

## 2015-03-01 DIAGNOSIS — I129 Hypertensive chronic kidney disease with stage 1 through stage 4 chronic kidney disease, or unspecified chronic kidney disease: Secondary | ICD-10-CM | POA: Diagnosis not present

## 2015-03-01 DIAGNOSIS — Z7982 Long term (current) use of aspirin: Secondary | ICD-10-CM | POA: Diagnosis not present

## 2015-03-01 DIAGNOSIS — Z87891 Personal history of nicotine dependence: Secondary | ICD-10-CM | POA: Diagnosis not present

## 2015-03-01 DIAGNOSIS — N189 Chronic kidney disease, unspecified: Secondary | ICD-10-CM | POA: Diagnosis not present

## 2015-03-01 DIAGNOSIS — Z7902 Long term (current) use of antithrombotics/antiplatelets: Secondary | ICD-10-CM | POA: Diagnosis not present

## 2015-03-01 DIAGNOSIS — E119 Type 2 diabetes mellitus without complications: Secondary | ICD-10-CM | POA: Diagnosis not present

## 2015-03-01 DIAGNOSIS — J441 Chronic obstructive pulmonary disease with (acute) exacerbation: Secondary | ICD-10-CM | POA: Diagnosis not present

## 2015-03-01 DIAGNOSIS — I2510 Atherosclerotic heart disease of native coronary artery without angina pectoris: Secondary | ICD-10-CM | POA: Diagnosis not present

## 2015-03-01 DIAGNOSIS — Z9981 Dependence on supplemental oxygen: Secondary | ICD-10-CM | POA: Diagnosis not present

## 2015-03-01 DIAGNOSIS — Z9181 History of falling: Secondary | ICD-10-CM | POA: Diagnosis not present

## 2015-03-02 DIAGNOSIS — J9611 Chronic respiratory failure with hypoxia: Secondary | ICD-10-CM | POA: Diagnosis not present

## 2015-03-02 DIAGNOSIS — J441 Chronic obstructive pulmonary disease with (acute) exacerbation: Secondary | ICD-10-CM | POA: Diagnosis not present

## 2015-03-02 DIAGNOSIS — Z87891 Personal history of nicotine dependence: Secondary | ICD-10-CM | POA: Diagnosis not present

## 2015-03-02 DIAGNOSIS — I2510 Atherosclerotic heart disease of native coronary artery without angina pectoris: Secondary | ICD-10-CM | POA: Diagnosis not present

## 2015-03-02 DIAGNOSIS — Z7902 Long term (current) use of antithrombotics/antiplatelets: Secondary | ICD-10-CM | POA: Diagnosis not present

## 2015-03-02 DIAGNOSIS — Z86718 Personal history of other venous thrombosis and embolism: Secondary | ICD-10-CM | POA: Diagnosis not present

## 2015-03-02 DIAGNOSIS — Z9981 Dependence on supplemental oxygen: Secondary | ICD-10-CM | POA: Diagnosis not present

## 2015-03-02 DIAGNOSIS — Z9181 History of falling: Secondary | ICD-10-CM | POA: Diagnosis not present

## 2015-03-02 DIAGNOSIS — Z7982 Long term (current) use of aspirin: Secondary | ICD-10-CM | POA: Diagnosis not present

## 2015-03-02 DIAGNOSIS — I129 Hypertensive chronic kidney disease with stage 1 through stage 4 chronic kidney disease, or unspecified chronic kidney disease: Secondary | ICD-10-CM | POA: Diagnosis not present

## 2015-03-02 DIAGNOSIS — N189 Chronic kidney disease, unspecified: Secondary | ICD-10-CM | POA: Diagnosis not present

## 2015-03-02 DIAGNOSIS — E119 Type 2 diabetes mellitus without complications: Secondary | ICD-10-CM | POA: Diagnosis not present

## 2015-03-03 DIAGNOSIS — J441 Chronic obstructive pulmonary disease with (acute) exacerbation: Secondary | ICD-10-CM | POA: Diagnosis not present

## 2015-03-03 DIAGNOSIS — Z7902 Long term (current) use of antithrombotics/antiplatelets: Secondary | ICD-10-CM | POA: Diagnosis not present

## 2015-03-03 DIAGNOSIS — Z9181 History of falling: Secondary | ICD-10-CM | POA: Diagnosis not present

## 2015-03-03 DIAGNOSIS — Z7982 Long term (current) use of aspirin: Secondary | ICD-10-CM | POA: Diagnosis not present

## 2015-03-03 DIAGNOSIS — I129 Hypertensive chronic kidney disease with stage 1 through stage 4 chronic kidney disease, or unspecified chronic kidney disease: Secondary | ICD-10-CM | POA: Diagnosis not present

## 2015-03-03 DIAGNOSIS — Z86718 Personal history of other venous thrombosis and embolism: Secondary | ICD-10-CM | POA: Diagnosis not present

## 2015-03-03 DIAGNOSIS — Z87891 Personal history of nicotine dependence: Secondary | ICD-10-CM | POA: Diagnosis not present

## 2015-03-03 DIAGNOSIS — E119 Type 2 diabetes mellitus without complications: Secondary | ICD-10-CM | POA: Diagnosis not present

## 2015-03-03 DIAGNOSIS — N189 Chronic kidney disease, unspecified: Secondary | ICD-10-CM | POA: Diagnosis not present

## 2015-03-03 DIAGNOSIS — Z9981 Dependence on supplemental oxygen: Secondary | ICD-10-CM | POA: Diagnosis not present

## 2015-03-03 DIAGNOSIS — J9611 Chronic respiratory failure with hypoxia: Secondary | ICD-10-CM | POA: Diagnosis not present

## 2015-03-03 DIAGNOSIS — I2510 Atherosclerotic heart disease of native coronary artery without angina pectoris: Secondary | ICD-10-CM | POA: Diagnosis not present

## 2015-03-07 DIAGNOSIS — Z87891 Personal history of nicotine dependence: Secondary | ICD-10-CM | POA: Diagnosis not present

## 2015-03-07 DIAGNOSIS — N189 Chronic kidney disease, unspecified: Secondary | ICD-10-CM | POA: Diagnosis not present

## 2015-03-07 DIAGNOSIS — Z9981 Dependence on supplemental oxygen: Secondary | ICD-10-CM | POA: Diagnosis not present

## 2015-03-07 DIAGNOSIS — J9611 Chronic respiratory failure with hypoxia: Secondary | ICD-10-CM | POA: Diagnosis not present

## 2015-03-07 DIAGNOSIS — Z9181 History of falling: Secondary | ICD-10-CM | POA: Diagnosis not present

## 2015-03-07 DIAGNOSIS — Z86718 Personal history of other venous thrombosis and embolism: Secondary | ICD-10-CM | POA: Diagnosis not present

## 2015-03-07 DIAGNOSIS — I129 Hypertensive chronic kidney disease with stage 1 through stage 4 chronic kidney disease, or unspecified chronic kidney disease: Secondary | ICD-10-CM | POA: Diagnosis not present

## 2015-03-07 DIAGNOSIS — J441 Chronic obstructive pulmonary disease with (acute) exacerbation: Secondary | ICD-10-CM | POA: Diagnosis not present

## 2015-03-07 DIAGNOSIS — I2510 Atherosclerotic heart disease of native coronary artery without angina pectoris: Secondary | ICD-10-CM | POA: Diagnosis not present

## 2015-03-07 DIAGNOSIS — Z7982 Long term (current) use of aspirin: Secondary | ICD-10-CM | POA: Diagnosis not present

## 2015-03-07 DIAGNOSIS — Z7902 Long term (current) use of antithrombotics/antiplatelets: Secondary | ICD-10-CM | POA: Diagnosis not present

## 2015-03-07 DIAGNOSIS — E119 Type 2 diabetes mellitus without complications: Secondary | ICD-10-CM | POA: Diagnosis not present

## 2015-03-08 DIAGNOSIS — J441 Chronic obstructive pulmonary disease with (acute) exacerbation: Secondary | ICD-10-CM | POA: Diagnosis not present

## 2015-03-09 DIAGNOSIS — N189 Chronic kidney disease, unspecified: Secondary | ICD-10-CM | POA: Diagnosis not present

## 2015-03-09 DIAGNOSIS — I129 Hypertensive chronic kidney disease with stage 1 through stage 4 chronic kidney disease, or unspecified chronic kidney disease: Secondary | ICD-10-CM | POA: Diagnosis not present

## 2015-03-09 DIAGNOSIS — Z9981 Dependence on supplemental oxygen: Secondary | ICD-10-CM | POA: Diagnosis not present

## 2015-03-09 DIAGNOSIS — Z87891 Personal history of nicotine dependence: Secondary | ICD-10-CM | POA: Diagnosis not present

## 2015-03-09 DIAGNOSIS — E119 Type 2 diabetes mellitus without complications: Secondary | ICD-10-CM | POA: Diagnosis not present

## 2015-03-09 DIAGNOSIS — Z7982 Long term (current) use of aspirin: Secondary | ICD-10-CM | POA: Diagnosis not present

## 2015-03-09 DIAGNOSIS — I2510 Atherosclerotic heart disease of native coronary artery without angina pectoris: Secondary | ICD-10-CM | POA: Diagnosis not present

## 2015-03-09 DIAGNOSIS — J441 Chronic obstructive pulmonary disease with (acute) exacerbation: Secondary | ICD-10-CM | POA: Diagnosis not present

## 2015-03-09 DIAGNOSIS — Z86718 Personal history of other venous thrombosis and embolism: Secondary | ICD-10-CM | POA: Diagnosis not present

## 2015-03-09 DIAGNOSIS — Z9181 History of falling: Secondary | ICD-10-CM | POA: Diagnosis not present

## 2015-03-09 DIAGNOSIS — J9611 Chronic respiratory failure with hypoxia: Secondary | ICD-10-CM | POA: Diagnosis not present

## 2015-03-09 DIAGNOSIS — Z7902 Long term (current) use of antithrombotics/antiplatelets: Secondary | ICD-10-CM | POA: Diagnosis not present

## 2015-03-10 DIAGNOSIS — Z7902 Long term (current) use of antithrombotics/antiplatelets: Secondary | ICD-10-CM | POA: Diagnosis not present

## 2015-03-10 DIAGNOSIS — J441 Chronic obstructive pulmonary disease with (acute) exacerbation: Secondary | ICD-10-CM | POA: Diagnosis not present

## 2015-03-10 DIAGNOSIS — N189 Chronic kidney disease, unspecified: Secondary | ICD-10-CM | POA: Diagnosis not present

## 2015-03-10 DIAGNOSIS — Z9981 Dependence on supplemental oxygen: Secondary | ICD-10-CM | POA: Diagnosis not present

## 2015-03-10 DIAGNOSIS — I2510 Atherosclerotic heart disease of native coronary artery without angina pectoris: Secondary | ICD-10-CM | POA: Diagnosis not present

## 2015-03-10 DIAGNOSIS — E119 Type 2 diabetes mellitus without complications: Secondary | ICD-10-CM | POA: Diagnosis not present

## 2015-03-10 DIAGNOSIS — I129 Hypertensive chronic kidney disease with stage 1 through stage 4 chronic kidney disease, or unspecified chronic kidney disease: Secondary | ICD-10-CM | POA: Diagnosis not present

## 2015-03-10 DIAGNOSIS — Z87891 Personal history of nicotine dependence: Secondary | ICD-10-CM | POA: Diagnosis not present

## 2015-03-10 DIAGNOSIS — J9611 Chronic respiratory failure with hypoxia: Secondary | ICD-10-CM | POA: Diagnosis not present

## 2015-03-10 DIAGNOSIS — Z7982 Long term (current) use of aspirin: Secondary | ICD-10-CM | POA: Diagnosis not present

## 2015-03-10 DIAGNOSIS — Z86718 Personal history of other venous thrombosis and embolism: Secondary | ICD-10-CM | POA: Diagnosis not present

## 2015-03-10 DIAGNOSIS — Z9181 History of falling: Secondary | ICD-10-CM | POA: Diagnosis not present

## 2015-03-11 DIAGNOSIS — Z7982 Long term (current) use of aspirin: Secondary | ICD-10-CM | POA: Diagnosis not present

## 2015-03-11 DIAGNOSIS — Z86718 Personal history of other venous thrombosis and embolism: Secondary | ICD-10-CM | POA: Diagnosis not present

## 2015-03-11 DIAGNOSIS — Z9981 Dependence on supplemental oxygen: Secondary | ICD-10-CM | POA: Diagnosis not present

## 2015-03-11 DIAGNOSIS — N189 Chronic kidney disease, unspecified: Secondary | ICD-10-CM | POA: Diagnosis not present

## 2015-03-11 DIAGNOSIS — Z7902 Long term (current) use of antithrombotics/antiplatelets: Secondary | ICD-10-CM | POA: Diagnosis not present

## 2015-03-11 DIAGNOSIS — I129 Hypertensive chronic kidney disease with stage 1 through stage 4 chronic kidney disease, or unspecified chronic kidney disease: Secondary | ICD-10-CM | POA: Diagnosis not present

## 2015-03-11 DIAGNOSIS — I2510 Atherosclerotic heart disease of native coronary artery without angina pectoris: Secondary | ICD-10-CM | POA: Diagnosis not present

## 2015-03-11 DIAGNOSIS — E119 Type 2 diabetes mellitus without complications: Secondary | ICD-10-CM | POA: Diagnosis not present

## 2015-03-11 DIAGNOSIS — Z9181 History of falling: Secondary | ICD-10-CM | POA: Diagnosis not present

## 2015-03-11 DIAGNOSIS — Z87891 Personal history of nicotine dependence: Secondary | ICD-10-CM | POA: Diagnosis not present

## 2015-03-11 DIAGNOSIS — J441 Chronic obstructive pulmonary disease with (acute) exacerbation: Secondary | ICD-10-CM | POA: Diagnosis not present

## 2015-03-11 DIAGNOSIS — J9611 Chronic respiratory failure with hypoxia: Secondary | ICD-10-CM | POA: Diagnosis not present

## 2015-03-14 DIAGNOSIS — Z9981 Dependence on supplemental oxygen: Secondary | ICD-10-CM | POA: Diagnosis not present

## 2015-03-14 DIAGNOSIS — Z9181 History of falling: Secondary | ICD-10-CM | POA: Diagnosis not present

## 2015-03-14 DIAGNOSIS — I2510 Atherosclerotic heart disease of native coronary artery without angina pectoris: Secondary | ICD-10-CM | POA: Diagnosis not present

## 2015-03-14 DIAGNOSIS — I129 Hypertensive chronic kidney disease with stage 1 through stage 4 chronic kidney disease, or unspecified chronic kidney disease: Secondary | ICD-10-CM | POA: Diagnosis not present

## 2015-03-14 DIAGNOSIS — Z7982 Long term (current) use of aspirin: Secondary | ICD-10-CM | POA: Diagnosis not present

## 2015-03-14 DIAGNOSIS — Z86718 Personal history of other venous thrombosis and embolism: Secondary | ICD-10-CM | POA: Diagnosis not present

## 2015-03-14 DIAGNOSIS — Z87891 Personal history of nicotine dependence: Secondary | ICD-10-CM | POA: Diagnosis not present

## 2015-03-14 DIAGNOSIS — J441 Chronic obstructive pulmonary disease with (acute) exacerbation: Secondary | ICD-10-CM | POA: Diagnosis not present

## 2015-03-14 DIAGNOSIS — J9611 Chronic respiratory failure with hypoxia: Secondary | ICD-10-CM | POA: Diagnosis not present

## 2015-03-14 DIAGNOSIS — E119 Type 2 diabetes mellitus without complications: Secondary | ICD-10-CM | POA: Diagnosis not present

## 2015-03-14 DIAGNOSIS — N189 Chronic kidney disease, unspecified: Secondary | ICD-10-CM | POA: Diagnosis not present

## 2015-03-14 DIAGNOSIS — Z7902 Long term (current) use of antithrombotics/antiplatelets: Secondary | ICD-10-CM | POA: Diagnosis not present

## 2015-03-15 DIAGNOSIS — J9611 Chronic respiratory failure with hypoxia: Secondary | ICD-10-CM | POA: Diagnosis not present

## 2015-03-15 DIAGNOSIS — N189 Chronic kidney disease, unspecified: Secondary | ICD-10-CM | POA: Diagnosis not present

## 2015-03-15 DIAGNOSIS — Z9981 Dependence on supplemental oxygen: Secondary | ICD-10-CM | POA: Diagnosis not present

## 2015-03-15 DIAGNOSIS — Z7982 Long term (current) use of aspirin: Secondary | ICD-10-CM | POA: Diagnosis not present

## 2015-03-15 DIAGNOSIS — Z9181 History of falling: Secondary | ICD-10-CM | POA: Diagnosis not present

## 2015-03-15 DIAGNOSIS — Z87891 Personal history of nicotine dependence: Secondary | ICD-10-CM | POA: Diagnosis not present

## 2015-03-15 DIAGNOSIS — I2510 Atherosclerotic heart disease of native coronary artery without angina pectoris: Secondary | ICD-10-CM | POA: Diagnosis not present

## 2015-03-15 DIAGNOSIS — Z86718 Personal history of other venous thrombosis and embolism: Secondary | ICD-10-CM | POA: Diagnosis not present

## 2015-03-15 DIAGNOSIS — E119 Type 2 diabetes mellitus without complications: Secondary | ICD-10-CM | POA: Diagnosis not present

## 2015-03-15 DIAGNOSIS — J441 Chronic obstructive pulmonary disease with (acute) exacerbation: Secondary | ICD-10-CM | POA: Diagnosis not present

## 2015-03-15 DIAGNOSIS — I129 Hypertensive chronic kidney disease with stage 1 through stage 4 chronic kidney disease, or unspecified chronic kidney disease: Secondary | ICD-10-CM | POA: Diagnosis not present

## 2015-03-15 DIAGNOSIS — Z7902 Long term (current) use of antithrombotics/antiplatelets: Secondary | ICD-10-CM | POA: Diagnosis not present

## 2015-03-17 DIAGNOSIS — N189 Chronic kidney disease, unspecified: Secondary | ICD-10-CM | POA: Diagnosis not present

## 2015-03-17 DIAGNOSIS — I129 Hypertensive chronic kidney disease with stage 1 through stage 4 chronic kidney disease, or unspecified chronic kidney disease: Secondary | ICD-10-CM | POA: Diagnosis not present

## 2015-03-17 DIAGNOSIS — Z7982 Long term (current) use of aspirin: Secondary | ICD-10-CM | POA: Diagnosis not present

## 2015-03-17 DIAGNOSIS — I2510 Atherosclerotic heart disease of native coronary artery without angina pectoris: Secondary | ICD-10-CM | POA: Diagnosis not present

## 2015-03-17 DIAGNOSIS — Z7902 Long term (current) use of antithrombotics/antiplatelets: Secondary | ICD-10-CM | POA: Diagnosis not present

## 2015-03-17 DIAGNOSIS — Z87891 Personal history of nicotine dependence: Secondary | ICD-10-CM | POA: Diagnosis not present

## 2015-03-17 DIAGNOSIS — J441 Chronic obstructive pulmonary disease with (acute) exacerbation: Secondary | ICD-10-CM | POA: Diagnosis not present

## 2015-03-17 DIAGNOSIS — Z9981 Dependence on supplemental oxygen: Secondary | ICD-10-CM | POA: Diagnosis not present

## 2015-03-17 DIAGNOSIS — E119 Type 2 diabetes mellitus without complications: Secondary | ICD-10-CM | POA: Diagnosis not present

## 2015-03-17 DIAGNOSIS — Z86718 Personal history of other venous thrombosis and embolism: Secondary | ICD-10-CM | POA: Diagnosis not present

## 2015-03-17 DIAGNOSIS — J9611 Chronic respiratory failure with hypoxia: Secondary | ICD-10-CM | POA: Diagnosis not present

## 2015-03-17 DIAGNOSIS — Z9181 History of falling: Secondary | ICD-10-CM | POA: Diagnosis not present

## 2015-03-19 DIAGNOSIS — J449 Chronic obstructive pulmonary disease, unspecified: Secondary | ICD-10-CM | POA: Diagnosis not present

## 2015-03-20 DIAGNOSIS — J441 Chronic obstructive pulmonary disease with (acute) exacerbation: Secondary | ICD-10-CM | POA: Diagnosis not present

## 2015-03-20 DIAGNOSIS — J9611 Chronic respiratory failure with hypoxia: Secondary | ICD-10-CM | POA: Diagnosis not present

## 2015-03-20 DIAGNOSIS — I129 Hypertensive chronic kidney disease with stage 1 through stage 4 chronic kidney disease, or unspecified chronic kidney disease: Secondary | ICD-10-CM | POA: Diagnosis not present

## 2015-03-22 DIAGNOSIS — E119 Type 2 diabetes mellitus without complications: Secondary | ICD-10-CM | POA: Diagnosis not present

## 2015-03-22 DIAGNOSIS — Z7982 Long term (current) use of aspirin: Secondary | ICD-10-CM | POA: Diagnosis not present

## 2015-03-22 DIAGNOSIS — J9611 Chronic respiratory failure with hypoxia: Secondary | ICD-10-CM | POA: Diagnosis not present

## 2015-03-22 DIAGNOSIS — Z7902 Long term (current) use of antithrombotics/antiplatelets: Secondary | ICD-10-CM | POA: Diagnosis not present

## 2015-03-22 DIAGNOSIS — Z87891 Personal history of nicotine dependence: Secondary | ICD-10-CM | POA: Diagnosis not present

## 2015-03-22 DIAGNOSIS — J441 Chronic obstructive pulmonary disease with (acute) exacerbation: Secondary | ICD-10-CM | POA: Diagnosis not present

## 2015-03-22 DIAGNOSIS — N189 Chronic kidney disease, unspecified: Secondary | ICD-10-CM | POA: Diagnosis not present

## 2015-03-22 DIAGNOSIS — I129 Hypertensive chronic kidney disease with stage 1 through stage 4 chronic kidney disease, or unspecified chronic kidney disease: Secondary | ICD-10-CM | POA: Diagnosis not present

## 2015-03-22 DIAGNOSIS — I2510 Atherosclerotic heart disease of native coronary artery without angina pectoris: Secondary | ICD-10-CM | POA: Diagnosis not present

## 2015-03-22 DIAGNOSIS — Z86718 Personal history of other venous thrombosis and embolism: Secondary | ICD-10-CM | POA: Diagnosis not present

## 2015-03-22 DIAGNOSIS — Z9181 History of falling: Secondary | ICD-10-CM | POA: Diagnosis not present

## 2015-03-22 DIAGNOSIS — Z9981 Dependence on supplemental oxygen: Secondary | ICD-10-CM | POA: Diagnosis not present

## 2015-03-24 DIAGNOSIS — I2510 Atherosclerotic heart disease of native coronary artery without angina pectoris: Secondary | ICD-10-CM | POA: Diagnosis not present

## 2015-03-24 DIAGNOSIS — E119 Type 2 diabetes mellitus without complications: Secondary | ICD-10-CM | POA: Diagnosis not present

## 2015-03-24 DIAGNOSIS — Z7982 Long term (current) use of aspirin: Secondary | ICD-10-CM | POA: Diagnosis not present

## 2015-03-24 DIAGNOSIS — Z86718 Personal history of other venous thrombosis and embolism: Secondary | ICD-10-CM | POA: Diagnosis not present

## 2015-03-24 DIAGNOSIS — Z7902 Long term (current) use of antithrombotics/antiplatelets: Secondary | ICD-10-CM | POA: Diagnosis not present

## 2015-03-24 DIAGNOSIS — Z9181 History of falling: Secondary | ICD-10-CM | POA: Diagnosis not present

## 2015-03-24 DIAGNOSIS — N189 Chronic kidney disease, unspecified: Secondary | ICD-10-CM | POA: Diagnosis not present

## 2015-03-24 DIAGNOSIS — J9611 Chronic respiratory failure with hypoxia: Secondary | ICD-10-CM | POA: Diagnosis not present

## 2015-03-24 DIAGNOSIS — J441 Chronic obstructive pulmonary disease with (acute) exacerbation: Secondary | ICD-10-CM | POA: Diagnosis not present

## 2015-03-24 DIAGNOSIS — Z9981 Dependence on supplemental oxygen: Secondary | ICD-10-CM | POA: Diagnosis not present

## 2015-03-24 DIAGNOSIS — I129 Hypertensive chronic kidney disease with stage 1 through stage 4 chronic kidney disease, or unspecified chronic kidney disease: Secondary | ICD-10-CM | POA: Diagnosis not present

## 2015-03-24 DIAGNOSIS — Z87891 Personal history of nicotine dependence: Secondary | ICD-10-CM | POA: Diagnosis not present

## 2015-03-29 DIAGNOSIS — Z86718 Personal history of other venous thrombosis and embolism: Secondary | ICD-10-CM | POA: Diagnosis not present

## 2015-03-29 DIAGNOSIS — I129 Hypertensive chronic kidney disease with stage 1 through stage 4 chronic kidney disease, or unspecified chronic kidney disease: Secondary | ICD-10-CM | POA: Diagnosis not present

## 2015-03-29 DIAGNOSIS — Z7982 Long term (current) use of aspirin: Secondary | ICD-10-CM | POA: Diagnosis not present

## 2015-03-29 DIAGNOSIS — E119 Type 2 diabetes mellitus without complications: Secondary | ICD-10-CM | POA: Diagnosis not present

## 2015-03-29 DIAGNOSIS — Z7902 Long term (current) use of antithrombotics/antiplatelets: Secondary | ICD-10-CM | POA: Diagnosis not present

## 2015-03-29 DIAGNOSIS — J441 Chronic obstructive pulmonary disease with (acute) exacerbation: Secondary | ICD-10-CM | POA: Diagnosis not present

## 2015-03-29 DIAGNOSIS — I2510 Atherosclerotic heart disease of native coronary artery without angina pectoris: Secondary | ICD-10-CM | POA: Diagnosis not present

## 2015-03-29 DIAGNOSIS — N189 Chronic kidney disease, unspecified: Secondary | ICD-10-CM | POA: Diagnosis not present

## 2015-03-29 DIAGNOSIS — Z9981 Dependence on supplemental oxygen: Secondary | ICD-10-CM | POA: Diagnosis not present

## 2015-03-29 DIAGNOSIS — J9611 Chronic respiratory failure with hypoxia: Secondary | ICD-10-CM | POA: Diagnosis not present

## 2015-03-29 DIAGNOSIS — Z9181 History of falling: Secondary | ICD-10-CM | POA: Diagnosis not present

## 2015-03-29 DIAGNOSIS — Z87891 Personal history of nicotine dependence: Secondary | ICD-10-CM | POA: Diagnosis not present

## 2015-03-31 DIAGNOSIS — Z87891 Personal history of nicotine dependence: Secondary | ICD-10-CM | POA: Diagnosis not present

## 2015-03-31 DIAGNOSIS — I2510 Atherosclerotic heart disease of native coronary artery without angina pectoris: Secondary | ICD-10-CM | POA: Diagnosis not present

## 2015-03-31 DIAGNOSIS — J441 Chronic obstructive pulmonary disease with (acute) exacerbation: Secondary | ICD-10-CM | POA: Diagnosis not present

## 2015-03-31 DIAGNOSIS — Z86718 Personal history of other venous thrombosis and embolism: Secondary | ICD-10-CM | POA: Diagnosis not present

## 2015-03-31 DIAGNOSIS — Z9181 History of falling: Secondary | ICD-10-CM | POA: Diagnosis not present

## 2015-03-31 DIAGNOSIS — Z9981 Dependence on supplemental oxygen: Secondary | ICD-10-CM | POA: Diagnosis not present

## 2015-03-31 DIAGNOSIS — E119 Type 2 diabetes mellitus without complications: Secondary | ICD-10-CM | POA: Diagnosis not present

## 2015-03-31 DIAGNOSIS — Z7982 Long term (current) use of aspirin: Secondary | ICD-10-CM | POA: Diagnosis not present

## 2015-03-31 DIAGNOSIS — Z7902 Long term (current) use of antithrombotics/antiplatelets: Secondary | ICD-10-CM | POA: Diagnosis not present

## 2015-03-31 DIAGNOSIS — J9611 Chronic respiratory failure with hypoxia: Secondary | ICD-10-CM | POA: Diagnosis not present

## 2015-03-31 DIAGNOSIS — N189 Chronic kidney disease, unspecified: Secondary | ICD-10-CM | POA: Diagnosis not present

## 2015-03-31 DIAGNOSIS — I129 Hypertensive chronic kidney disease with stage 1 through stage 4 chronic kidney disease, or unspecified chronic kidney disease: Secondary | ICD-10-CM | POA: Diagnosis not present

## 2015-04-05 DIAGNOSIS — J441 Chronic obstructive pulmonary disease with (acute) exacerbation: Secondary | ICD-10-CM | POA: Diagnosis not present

## 2015-04-05 DIAGNOSIS — Z86718 Personal history of other venous thrombosis and embolism: Secondary | ICD-10-CM | POA: Diagnosis not present

## 2015-04-05 DIAGNOSIS — J9611 Chronic respiratory failure with hypoxia: Secondary | ICD-10-CM | POA: Diagnosis not present

## 2015-04-05 DIAGNOSIS — Z9181 History of falling: Secondary | ICD-10-CM | POA: Diagnosis not present

## 2015-04-05 DIAGNOSIS — Z7902 Long term (current) use of antithrombotics/antiplatelets: Secondary | ICD-10-CM | POA: Diagnosis not present

## 2015-04-05 DIAGNOSIS — Z9981 Dependence on supplemental oxygen: Secondary | ICD-10-CM | POA: Diagnosis not present

## 2015-04-05 DIAGNOSIS — N189 Chronic kidney disease, unspecified: Secondary | ICD-10-CM | POA: Diagnosis not present

## 2015-04-05 DIAGNOSIS — I2510 Atherosclerotic heart disease of native coronary artery without angina pectoris: Secondary | ICD-10-CM | POA: Diagnosis not present

## 2015-04-05 DIAGNOSIS — Z87891 Personal history of nicotine dependence: Secondary | ICD-10-CM | POA: Diagnosis not present

## 2015-04-05 DIAGNOSIS — I129 Hypertensive chronic kidney disease with stage 1 through stage 4 chronic kidney disease, or unspecified chronic kidney disease: Secondary | ICD-10-CM | POA: Diagnosis not present

## 2015-04-05 DIAGNOSIS — E119 Type 2 diabetes mellitus without complications: Secondary | ICD-10-CM | POA: Diagnosis not present

## 2015-04-05 DIAGNOSIS — Z7982 Long term (current) use of aspirin: Secondary | ICD-10-CM | POA: Diagnosis not present

## 2015-04-07 DIAGNOSIS — N189 Chronic kidney disease, unspecified: Secondary | ICD-10-CM | POA: Diagnosis not present

## 2015-04-07 DIAGNOSIS — I2510 Atherosclerotic heart disease of native coronary artery without angina pectoris: Secondary | ICD-10-CM | POA: Diagnosis not present

## 2015-04-07 DIAGNOSIS — E119 Type 2 diabetes mellitus without complications: Secondary | ICD-10-CM | POA: Diagnosis not present

## 2015-04-07 DIAGNOSIS — Z7902 Long term (current) use of antithrombotics/antiplatelets: Secondary | ICD-10-CM | POA: Diagnosis not present

## 2015-04-07 DIAGNOSIS — Z87891 Personal history of nicotine dependence: Secondary | ICD-10-CM | POA: Diagnosis not present

## 2015-04-07 DIAGNOSIS — Z7982 Long term (current) use of aspirin: Secondary | ICD-10-CM | POA: Diagnosis not present

## 2015-04-07 DIAGNOSIS — J441 Chronic obstructive pulmonary disease with (acute) exacerbation: Secondary | ICD-10-CM | POA: Diagnosis not present

## 2015-04-07 DIAGNOSIS — Z9981 Dependence on supplemental oxygen: Secondary | ICD-10-CM | POA: Diagnosis not present

## 2015-04-07 DIAGNOSIS — Z9181 History of falling: Secondary | ICD-10-CM | POA: Diagnosis not present

## 2015-04-07 DIAGNOSIS — I129 Hypertensive chronic kidney disease with stage 1 through stage 4 chronic kidney disease, or unspecified chronic kidney disease: Secondary | ICD-10-CM | POA: Diagnosis not present

## 2015-04-07 DIAGNOSIS — Z86718 Personal history of other venous thrombosis and embolism: Secondary | ICD-10-CM | POA: Diagnosis not present

## 2015-04-07 DIAGNOSIS — J9611 Chronic respiratory failure with hypoxia: Secondary | ICD-10-CM | POA: Diagnosis not present

## 2015-04-13 DIAGNOSIS — I2510 Atherosclerotic heart disease of native coronary artery without angina pectoris: Secondary | ICD-10-CM | POA: Diagnosis not present

## 2015-04-13 DIAGNOSIS — Z9981 Dependence on supplemental oxygen: Secondary | ICD-10-CM | POA: Diagnosis not present

## 2015-04-13 DIAGNOSIS — J441 Chronic obstructive pulmonary disease with (acute) exacerbation: Secondary | ICD-10-CM | POA: Diagnosis not present

## 2015-04-13 DIAGNOSIS — Z7902 Long term (current) use of antithrombotics/antiplatelets: Secondary | ICD-10-CM | POA: Diagnosis not present

## 2015-04-13 DIAGNOSIS — J9611 Chronic respiratory failure with hypoxia: Secondary | ICD-10-CM | POA: Diagnosis not present

## 2015-04-13 DIAGNOSIS — Z9181 History of falling: Secondary | ICD-10-CM | POA: Diagnosis not present

## 2015-04-13 DIAGNOSIS — I129 Hypertensive chronic kidney disease with stage 1 through stage 4 chronic kidney disease, or unspecified chronic kidney disease: Secondary | ICD-10-CM | POA: Diagnosis not present

## 2015-04-13 DIAGNOSIS — Z86718 Personal history of other venous thrombosis and embolism: Secondary | ICD-10-CM | POA: Diagnosis not present

## 2015-04-13 DIAGNOSIS — Z7982 Long term (current) use of aspirin: Secondary | ICD-10-CM | POA: Diagnosis not present

## 2015-04-13 DIAGNOSIS — E119 Type 2 diabetes mellitus without complications: Secondary | ICD-10-CM | POA: Diagnosis not present

## 2015-04-13 DIAGNOSIS — N189 Chronic kidney disease, unspecified: Secondary | ICD-10-CM | POA: Diagnosis not present

## 2015-04-13 DIAGNOSIS — Z87891 Personal history of nicotine dependence: Secondary | ICD-10-CM | POA: Diagnosis not present

## 2015-04-19 DIAGNOSIS — J449 Chronic obstructive pulmonary disease, unspecified: Secondary | ICD-10-CM | POA: Diagnosis not present

## 2015-04-20 DIAGNOSIS — Z9981 Dependence on supplemental oxygen: Secondary | ICD-10-CM | POA: Diagnosis not present

## 2015-04-20 DIAGNOSIS — J441 Chronic obstructive pulmonary disease with (acute) exacerbation: Secondary | ICD-10-CM | POA: Diagnosis not present

## 2015-04-20 DIAGNOSIS — I129 Hypertensive chronic kidney disease with stage 1 through stage 4 chronic kidney disease, or unspecified chronic kidney disease: Secondary | ICD-10-CM | POA: Diagnosis not present

## 2015-04-20 DIAGNOSIS — Z7902 Long term (current) use of antithrombotics/antiplatelets: Secondary | ICD-10-CM | POA: Diagnosis not present

## 2015-04-20 DIAGNOSIS — Z9181 History of falling: Secondary | ICD-10-CM | POA: Diagnosis not present

## 2015-04-20 DIAGNOSIS — Z87891 Personal history of nicotine dependence: Secondary | ICD-10-CM | POA: Diagnosis not present

## 2015-04-20 DIAGNOSIS — I2510 Atherosclerotic heart disease of native coronary artery without angina pectoris: Secondary | ICD-10-CM | POA: Diagnosis not present

## 2015-04-20 DIAGNOSIS — J9611 Chronic respiratory failure with hypoxia: Secondary | ICD-10-CM | POA: Diagnosis not present

## 2015-04-20 DIAGNOSIS — Z7982 Long term (current) use of aspirin: Secondary | ICD-10-CM | POA: Diagnosis not present

## 2015-04-20 DIAGNOSIS — Z86718 Personal history of other venous thrombosis and embolism: Secondary | ICD-10-CM | POA: Diagnosis not present

## 2015-04-20 DIAGNOSIS — E119 Type 2 diabetes mellitus without complications: Secondary | ICD-10-CM | POA: Diagnosis not present

## 2015-04-20 DIAGNOSIS — N189 Chronic kidney disease, unspecified: Secondary | ICD-10-CM | POA: Diagnosis not present

## 2015-05-19 DIAGNOSIS — J449 Chronic obstructive pulmonary disease, unspecified: Secondary | ICD-10-CM | POA: Diagnosis not present

## 2015-06-19 DIAGNOSIS — J449 Chronic obstructive pulmonary disease, unspecified: Secondary | ICD-10-CM | POA: Diagnosis not present

## 2015-07-19 DIAGNOSIS — J449 Chronic obstructive pulmonary disease, unspecified: Secondary | ICD-10-CM | POA: Diagnosis not present

## 2015-08-02 DIAGNOSIS — J449 Chronic obstructive pulmonary disease, unspecified: Secondary | ICD-10-CM | POA: Diagnosis not present

## 2015-08-19 DIAGNOSIS — J449 Chronic obstructive pulmonary disease, unspecified: Secondary | ICD-10-CM | POA: Diagnosis not present

## 2015-09-06 DIAGNOSIS — J441 Chronic obstructive pulmonary disease with (acute) exacerbation: Secondary | ICD-10-CM | POA: Diagnosis not present

## 2015-09-06 DIAGNOSIS — J209 Acute bronchitis, unspecified: Secondary | ICD-10-CM | POA: Diagnosis not present

## 2015-09-19 DIAGNOSIS — J449 Chronic obstructive pulmonary disease, unspecified: Secondary | ICD-10-CM | POA: Diagnosis not present

## 2015-09-30 ENCOUNTER — Other Ambulatory Visit: Payer: Self-pay | Admitting: Pharmacist

## 2015-09-30 NOTE — Patient Outreach (Signed)
Outreach call to Shaun Schmitt regarding his request for follow up from the [facility]. Left a HIPAA compliant message on the patient's voicemail.  Shaun Schmitt, PharmD Clinical Pharmacist [facility] Care Management [phone]

## 2015-10-11 DIAGNOSIS — J449 Chronic obstructive pulmonary disease, unspecified: Secondary | ICD-10-CM | POA: Diagnosis not present

## 2015-10-19 DIAGNOSIS — J449 Chronic obstructive pulmonary disease, unspecified: Secondary | ICD-10-CM | POA: Diagnosis not present

## 2015-11-02 DIAGNOSIS — Z1389 Encounter for screening for other disorder: Secondary | ICD-10-CM | POA: Diagnosis not present

## 2015-11-02 DIAGNOSIS — Z23 Encounter for immunization: Secondary | ICD-10-CM | POA: Diagnosis not present

## 2015-11-02 DIAGNOSIS — I739 Peripheral vascular disease, unspecified: Secondary | ICD-10-CM | POA: Diagnosis not present

## 2015-11-02 DIAGNOSIS — I2510 Atherosclerotic heart disease of native coronary artery without angina pectoris: Secondary | ICD-10-CM | POA: Diagnosis not present

## 2015-11-02 DIAGNOSIS — I10 Essential (primary) hypertension: Secondary | ICD-10-CM | POA: Diagnosis not present

## 2015-11-02 DIAGNOSIS — Z0000 Encounter for general adult medical examination without abnormal findings: Secondary | ICD-10-CM | POA: Diagnosis not present

## 2015-11-02 DIAGNOSIS — R3911 Hesitancy of micturition: Secondary | ICD-10-CM | POA: Diagnosis not present

## 2015-11-02 DIAGNOSIS — E7800 Pure hypercholesterolemia, unspecified: Secondary | ICD-10-CM | POA: Diagnosis not present

## 2015-11-02 DIAGNOSIS — M109 Gout, unspecified: Secondary | ICD-10-CM | POA: Diagnosis not present

## 2015-11-19 DIAGNOSIS — J449 Chronic obstructive pulmonary disease, unspecified: Secondary | ICD-10-CM | POA: Diagnosis not present

## 2015-12-19 DIAGNOSIS — J449 Chronic obstructive pulmonary disease, unspecified: Secondary | ICD-10-CM | POA: Diagnosis not present

## 2016-01-19 DIAGNOSIS — J449 Chronic obstructive pulmonary disease, unspecified: Secondary | ICD-10-CM | POA: Diagnosis not present

## 2016-02-08 DIAGNOSIS — G894 Chronic pain syndrome: Secondary | ICD-10-CM | POA: Diagnosis not present

## 2016-02-08 DIAGNOSIS — J449 Chronic obstructive pulmonary disease, unspecified: Secondary | ICD-10-CM | POA: Diagnosis not present

## 2016-02-19 DIAGNOSIS — J449 Chronic obstructive pulmonary disease, unspecified: Secondary | ICD-10-CM | POA: Diagnosis not present

## 2016-03-07 DIAGNOSIS — Z79899 Other long term (current) drug therapy: Secondary | ICD-10-CM | POA: Diagnosis not present

## 2016-03-07 DIAGNOSIS — G894 Chronic pain syndrome: Secondary | ICD-10-CM | POA: Diagnosis not present

## 2016-03-07 DIAGNOSIS — Z5181 Encounter for therapeutic drug level monitoring: Secondary | ICD-10-CM | POA: Diagnosis not present

## 2016-03-07 DIAGNOSIS — J449 Chronic obstructive pulmonary disease, unspecified: Secondary | ICD-10-CM | POA: Diagnosis not present

## 2016-03-07 DIAGNOSIS — J209 Acute bronchitis, unspecified: Secondary | ICD-10-CM | POA: Diagnosis not present

## 2016-03-08 DIAGNOSIS — J441 Chronic obstructive pulmonary disease with (acute) exacerbation: Secondary | ICD-10-CM | POA: Diagnosis not present

## 2016-03-08 DIAGNOSIS — Z9981 Dependence on supplemental oxygen: Secondary | ICD-10-CM | POA: Diagnosis not present

## 2016-03-08 DIAGNOSIS — Z885 Allergy status to narcotic agent status: Secondary | ICD-10-CM | POA: Diagnosis not present

## 2016-03-08 DIAGNOSIS — I252 Old myocardial infarction: Secondary | ICD-10-CM | POA: Diagnosis not present

## 2016-03-08 DIAGNOSIS — I2510 Atherosclerotic heart disease of native coronary artery without angina pectoris: Secondary | ICD-10-CM | POA: Diagnosis not present

## 2016-03-08 DIAGNOSIS — Z79899 Other long term (current) drug therapy: Secondary | ICD-10-CM | POA: Diagnosis not present

## 2016-03-08 DIAGNOSIS — M1990 Unspecified osteoarthritis, unspecified site: Secondary | ICD-10-CM | POA: Diagnosis not present

## 2016-03-08 DIAGNOSIS — J440 Chronic obstructive pulmonary disease with acute lower respiratory infection: Secondary | ICD-10-CM | POA: Diagnosis not present

## 2016-03-08 DIAGNOSIS — J189 Pneumonia, unspecified organism: Secondary | ICD-10-CM | POA: Diagnosis not present

## 2016-03-08 DIAGNOSIS — J9601 Acute respiratory failure with hypoxia: Secondary | ICD-10-CM | POA: Diagnosis not present

## 2016-03-08 DIAGNOSIS — J9602 Acute respiratory failure with hypercapnia: Secondary | ICD-10-CM | POA: Diagnosis not present

## 2016-03-08 DIAGNOSIS — F10239 Alcohol dependence with withdrawal, unspecified: Secondary | ICD-10-CM | POA: Diagnosis not present

## 2016-03-08 DIAGNOSIS — F419 Anxiety disorder, unspecified: Secondary | ICD-10-CM

## 2016-03-08 DIAGNOSIS — J9621 Acute and chronic respiratory failure with hypoxia: Secondary | ICD-10-CM | POA: Diagnosis not present

## 2016-03-08 DIAGNOSIS — R079 Chest pain, unspecified: Secondary | ICD-10-CM | POA: Diagnosis not present

## 2016-03-08 DIAGNOSIS — R1110 Vomiting, unspecified: Secondary | ICD-10-CM | POA: Diagnosis not present

## 2016-03-08 DIAGNOSIS — N179 Acute kidney failure, unspecified: Secondary | ICD-10-CM | POA: Diagnosis not present

## 2016-03-08 DIAGNOSIS — N261 Atrophy of kidney (terminal): Secondary | ICD-10-CM | POA: Diagnosis not present

## 2016-03-08 DIAGNOSIS — R0602 Shortness of breath: Secondary | ICD-10-CM | POA: Diagnosis not present

## 2016-03-08 DIAGNOSIS — Z87891 Personal history of nicotine dependence: Secondary | ICD-10-CM | POA: Diagnosis not present

## 2016-03-08 DIAGNOSIS — Z7982 Long term (current) use of aspirin: Secondary | ICD-10-CM | POA: Diagnosis not present

## 2016-03-08 DIAGNOSIS — I10 Essential (primary) hypertension: Secondary | ICD-10-CM | POA: Diagnosis not present

## 2016-03-13 DIAGNOSIS — F10239 Alcohol dependence with withdrawal, unspecified: Secondary | ICD-10-CM

## 2016-03-14 DIAGNOSIS — I10 Essential (primary) hypertension: Secondary | ICD-10-CM | POA: Diagnosis not present

## 2016-03-14 DIAGNOSIS — J441 Chronic obstructive pulmonary disease with (acute) exacerbation: Secondary | ICD-10-CM | POA: Diagnosis not present

## 2016-03-14 DIAGNOSIS — F10239 Alcohol dependence with withdrawal, unspecified: Secondary | ICD-10-CM | POA: Diagnosis not present

## 2016-03-16 DIAGNOSIS — B370 Candidal stomatitis: Secondary | ICD-10-CM | POA: Diagnosis not present

## 2016-03-16 DIAGNOSIS — J449 Chronic obstructive pulmonary disease, unspecified: Secondary | ICD-10-CM | POA: Diagnosis not present

## 2016-03-18 DIAGNOSIS — J449 Chronic obstructive pulmonary disease, unspecified: Secondary | ICD-10-CM | POA: Diagnosis not present

## 2016-04-02 DIAGNOSIS — J449 Chronic obstructive pulmonary disease, unspecified: Secondary | ICD-10-CM | POA: Diagnosis not present

## 2016-04-02 DIAGNOSIS — J189 Pneumonia, unspecified organism: Secondary | ICD-10-CM | POA: Diagnosis not present

## 2016-04-02 DIAGNOSIS — R5383 Other fatigue: Secondary | ICD-10-CM | POA: Diagnosis not present

## 2016-04-02 DIAGNOSIS — J441 Chronic obstructive pulmonary disease with (acute) exacerbation: Secondary | ICD-10-CM | POA: Diagnosis not present

## 2016-04-18 DIAGNOSIS — J449 Chronic obstructive pulmonary disease, unspecified: Secondary | ICD-10-CM | POA: Diagnosis not present

## 2016-05-18 DIAGNOSIS — J449 Chronic obstructive pulmonary disease, unspecified: Secondary | ICD-10-CM | POA: Diagnosis not present

## 2016-06-18 DIAGNOSIS — J449 Chronic obstructive pulmonary disease, unspecified: Secondary | ICD-10-CM | POA: Diagnosis not present

## 2016-06-20 DIAGNOSIS — I10 Essential (primary) hypertension: Secondary | ICD-10-CM | POA: Diagnosis not present

## 2016-06-20 DIAGNOSIS — J449 Chronic obstructive pulmonary disease, unspecified: Secondary | ICD-10-CM | POA: Diagnosis not present

## 2016-06-29 DIAGNOSIS — R5383 Other fatigue: Secondary | ICD-10-CM | POA: Diagnosis not present

## 2016-06-29 DIAGNOSIS — J449 Chronic obstructive pulmonary disease, unspecified: Secondary | ICD-10-CM | POA: Diagnosis not present

## 2016-07-18 DIAGNOSIS — J449 Chronic obstructive pulmonary disease, unspecified: Secondary | ICD-10-CM | POA: Diagnosis not present

## 2016-08-08 DIAGNOSIS — H524 Presbyopia: Secondary | ICD-10-CM | POA: Diagnosis not present

## 2016-08-08 DIAGNOSIS — H2513 Age-related nuclear cataract, bilateral: Secondary | ICD-10-CM | POA: Diagnosis not present

## 2016-08-08 DIAGNOSIS — H52223 Regular astigmatism, bilateral: Secondary | ICD-10-CM | POA: Diagnosis not present

## 2016-08-08 DIAGNOSIS — H5213 Myopia, bilateral: Secondary | ICD-10-CM | POA: Diagnosis not present

## 2016-08-10 DIAGNOSIS — R0602 Shortness of breath: Secondary | ICD-10-CM | POA: Diagnosis not present

## 2016-08-10 DIAGNOSIS — I700 Atherosclerosis of aorta: Secondary | ICD-10-CM | POA: Diagnosis not present

## 2016-08-10 DIAGNOSIS — J449 Chronic obstructive pulmonary disease, unspecified: Secondary | ICD-10-CM | POA: Diagnosis not present

## 2016-08-10 DIAGNOSIS — J439 Emphysema, unspecified: Secondary | ICD-10-CM | POA: Diagnosis not present

## 2016-08-18 DIAGNOSIS — J449 Chronic obstructive pulmonary disease, unspecified: Secondary | ICD-10-CM | POA: Diagnosis not present

## 2016-08-24 DIAGNOSIS — J449 Chronic obstructive pulmonary disease, unspecified: Secondary | ICD-10-CM | POA: Diagnosis not present

## 2016-09-18 DIAGNOSIS — J449 Chronic obstructive pulmonary disease, unspecified: Secondary | ICD-10-CM | POA: Diagnosis not present

## 2016-09-28 DIAGNOSIS — I10 Essential (primary) hypertension: Secondary | ICD-10-CM | POA: Diagnosis not present

## 2016-09-28 DIAGNOSIS — Z23 Encounter for immunization: Secondary | ICD-10-CM | POA: Diagnosis not present

## 2016-09-28 DIAGNOSIS — J449 Chronic obstructive pulmonary disease, unspecified: Secondary | ICD-10-CM | POA: Diagnosis not present

## 2016-10-18 DIAGNOSIS — J449 Chronic obstructive pulmonary disease, unspecified: Secondary | ICD-10-CM | POA: Diagnosis not present

## 2016-11-18 DIAGNOSIS — J449 Chronic obstructive pulmonary disease, unspecified: Secondary | ICD-10-CM | POA: Diagnosis not present

## 2016-12-18 DIAGNOSIS — J449 Chronic obstructive pulmonary disease, unspecified: Secondary | ICD-10-CM | POA: Diagnosis not present

## 2016-12-28 DIAGNOSIS — E7800 Pure hypercholesterolemia, unspecified: Secondary | ICD-10-CM | POA: Diagnosis not present

## 2016-12-28 DIAGNOSIS — I739 Peripheral vascular disease, unspecified: Secondary | ICD-10-CM | POA: Diagnosis not present

## 2016-12-28 DIAGNOSIS — I2510 Atherosclerotic heart disease of native coronary artery without angina pectoris: Secondary | ICD-10-CM | POA: Diagnosis not present

## 2016-12-28 DIAGNOSIS — Z0000 Encounter for general adult medical examination without abnormal findings: Secondary | ICD-10-CM | POA: Diagnosis not present

## 2016-12-28 DIAGNOSIS — Z1389 Encounter for screening for other disorder: Secondary | ICD-10-CM | POA: Diagnosis not present

## 2016-12-28 DIAGNOSIS — I10 Essential (primary) hypertension: Secondary | ICD-10-CM | POA: Diagnosis not present

## 2017-01-18 DIAGNOSIS — J449 Chronic obstructive pulmonary disease, unspecified: Secondary | ICD-10-CM | POA: Diagnosis not present

## 2017-01-24 ENCOUNTER — Other Ambulatory Visit: Payer: Self-pay

## 2017-01-24 DIAGNOSIS — I6529 Occlusion and stenosis of unspecified carotid artery: Secondary | ICD-10-CM

## 2017-01-24 DIAGNOSIS — I701 Atherosclerosis of renal artery: Secondary | ICD-10-CM

## 2017-01-24 DIAGNOSIS — I739 Peripheral vascular disease, unspecified: Secondary | ICD-10-CM

## 2017-02-18 DIAGNOSIS — J449 Chronic obstructive pulmonary disease, unspecified: Secondary | ICD-10-CM | POA: Diagnosis not present

## 2017-03-18 DIAGNOSIS — J449 Chronic obstructive pulmonary disease, unspecified: Secondary | ICD-10-CM | POA: Diagnosis not present

## 2017-03-29 DIAGNOSIS — R1310 Dysphagia, unspecified: Secondary | ICD-10-CM | POA: Diagnosis not present

## 2017-03-29 DIAGNOSIS — R0602 Shortness of breath: Secondary | ICD-10-CM | POA: Diagnosis not present

## 2017-03-29 DIAGNOSIS — J441 Chronic obstructive pulmonary disease with (acute) exacerbation: Secondary | ICD-10-CM | POA: Diagnosis not present

## 2017-04-16 ENCOUNTER — Encounter (HOSPITAL_COMMUNITY): Payer: Medicare Other

## 2017-04-16 ENCOUNTER — Encounter: Payer: Medicare Other | Admitting: Vascular Surgery

## 2017-04-18 DIAGNOSIS — J449 Chronic obstructive pulmonary disease, unspecified: Secondary | ICD-10-CM | POA: Diagnosis not present

## 2017-04-19 DIAGNOSIS — J209 Acute bronchitis, unspecified: Secondary | ICD-10-CM | POA: Diagnosis not present

## 2017-04-19 DIAGNOSIS — E860 Dehydration: Secondary | ICD-10-CM | POA: Diagnosis not present

## 2017-04-19 DIAGNOSIS — J441 Chronic obstructive pulmonary disease with (acute) exacerbation: Secondary | ICD-10-CM | POA: Diagnosis not present

## 2017-05-18 DIAGNOSIS — J449 Chronic obstructive pulmonary disease, unspecified: Secondary | ICD-10-CM | POA: Diagnosis not present

## 2017-06-08 DIAGNOSIS — R7989 Other specified abnormal findings of blood chemistry: Secondary | ICD-10-CM

## 2017-06-08 HISTORY — DX: Other specified abnormal findings of blood chemistry: R79.89

## 2017-06-12 DIAGNOSIS — R0600 Dyspnea, unspecified: Secondary | ICD-10-CM | POA: Diagnosis not present

## 2017-06-12 DIAGNOSIS — R1310 Dysphagia, unspecified: Secondary | ICD-10-CM | POA: Diagnosis not present

## 2017-06-12 DIAGNOSIS — R609 Edema, unspecified: Secondary | ICD-10-CM | POA: Diagnosis not present

## 2017-06-14 DIAGNOSIS — R609 Edema, unspecified: Secondary | ICD-10-CM | POA: Diagnosis not present

## 2017-06-18 DIAGNOSIS — I517 Cardiomegaly: Secondary | ICD-10-CM | POA: Diagnosis not present

## 2017-06-18 DIAGNOSIS — I2510 Atherosclerotic heart disease of native coronary artery without angina pectoris: Secondary | ICD-10-CM | POA: Diagnosis not present

## 2017-06-18 DIAGNOSIS — J449 Chronic obstructive pulmonary disease, unspecified: Secondary | ICD-10-CM | POA: Diagnosis not present

## 2017-06-18 DIAGNOSIS — I110 Hypertensive heart disease with heart failure: Secondary | ICD-10-CM | POA: Diagnosis not present

## 2017-06-18 DIAGNOSIS — I509 Heart failure, unspecified: Secondary | ICD-10-CM | POA: Diagnosis not present

## 2017-06-18 DIAGNOSIS — I739 Peripheral vascular disease, unspecified: Secondary | ICD-10-CM | POA: Diagnosis not present

## 2017-06-18 DIAGNOSIS — R600 Localized edema: Secondary | ICD-10-CM | POA: Diagnosis not present

## 2017-06-20 DIAGNOSIS — J449 Chronic obstructive pulmonary disease, unspecified: Secondary | ICD-10-CM | POA: Diagnosis not present

## 2017-06-20 DIAGNOSIS — I2510 Atherosclerotic heart disease of native coronary artery without angina pectoris: Secondary | ICD-10-CM | POA: Diagnosis not present

## 2017-06-20 DIAGNOSIS — I110 Hypertensive heart disease with heart failure: Secondary | ICD-10-CM | POA: Diagnosis not present

## 2017-06-20 DIAGNOSIS — I739 Peripheral vascular disease, unspecified: Secondary | ICD-10-CM | POA: Diagnosis not present

## 2017-06-20 DIAGNOSIS — I509 Heart failure, unspecified: Secondary | ICD-10-CM | POA: Diagnosis not present

## 2017-06-21 DIAGNOSIS — I509 Heart failure, unspecified: Secondary | ICD-10-CM | POA: Diagnosis not present

## 2017-06-21 DIAGNOSIS — J449 Chronic obstructive pulmonary disease, unspecified: Secondary | ICD-10-CM | POA: Diagnosis not present

## 2017-06-21 DIAGNOSIS — I110 Hypertensive heart disease with heart failure: Secondary | ICD-10-CM | POA: Diagnosis not present

## 2017-06-21 DIAGNOSIS — I739 Peripheral vascular disease, unspecified: Secondary | ICD-10-CM | POA: Diagnosis not present

## 2017-06-21 DIAGNOSIS — I2510 Atherosclerotic heart disease of native coronary artery without angina pectoris: Secondary | ICD-10-CM | POA: Diagnosis not present

## 2017-06-24 DIAGNOSIS — I509 Heart failure, unspecified: Secondary | ICD-10-CM | POA: Diagnosis not present

## 2017-06-24 DIAGNOSIS — I110 Hypertensive heart disease with heart failure: Secondary | ICD-10-CM | POA: Diagnosis not present

## 2017-06-24 DIAGNOSIS — I2510 Atherosclerotic heart disease of native coronary artery without angina pectoris: Secondary | ICD-10-CM | POA: Diagnosis not present

## 2017-06-24 DIAGNOSIS — I739 Peripheral vascular disease, unspecified: Secondary | ICD-10-CM | POA: Diagnosis not present

## 2017-06-24 DIAGNOSIS — J449 Chronic obstructive pulmonary disease, unspecified: Secondary | ICD-10-CM | POA: Diagnosis not present

## 2017-06-26 DIAGNOSIS — I739 Peripheral vascular disease, unspecified: Secondary | ICD-10-CM | POA: Diagnosis not present

## 2017-06-26 DIAGNOSIS — I509 Heart failure, unspecified: Secondary | ICD-10-CM | POA: Diagnosis not present

## 2017-06-26 DIAGNOSIS — I2510 Atherosclerotic heart disease of native coronary artery without angina pectoris: Secondary | ICD-10-CM | POA: Diagnosis not present

## 2017-06-26 DIAGNOSIS — J449 Chronic obstructive pulmonary disease, unspecified: Secondary | ICD-10-CM | POA: Diagnosis not present

## 2017-06-26 DIAGNOSIS — I110 Hypertensive heart disease with heart failure: Secondary | ICD-10-CM | POA: Diagnosis not present

## 2017-06-26 DIAGNOSIS — R609 Edema, unspecified: Secondary | ICD-10-CM

## 2017-06-26 DIAGNOSIS — R600 Localized edema: Secondary | ICD-10-CM

## 2017-06-26 HISTORY — DX: Edema, unspecified: R60.9

## 2017-06-26 HISTORY — DX: Localized edema: R60.0

## 2017-06-28 DIAGNOSIS — I739 Peripheral vascular disease, unspecified: Secondary | ICD-10-CM | POA: Diagnosis not present

## 2017-06-28 DIAGNOSIS — I110 Hypertensive heart disease with heart failure: Secondary | ICD-10-CM | POA: Diagnosis not present

## 2017-06-28 DIAGNOSIS — I2510 Atherosclerotic heart disease of native coronary artery without angina pectoris: Secondary | ICD-10-CM | POA: Diagnosis not present

## 2017-06-28 DIAGNOSIS — J449 Chronic obstructive pulmonary disease, unspecified: Secondary | ICD-10-CM | POA: Diagnosis not present

## 2017-06-28 DIAGNOSIS — I509 Heart failure, unspecified: Secondary | ICD-10-CM | POA: Diagnosis not present

## 2017-07-01 DIAGNOSIS — R05 Cough: Secondary | ICD-10-CM | POA: Diagnosis not present

## 2017-07-01 DIAGNOSIS — R748 Abnormal levels of other serum enzymes: Secondary | ICD-10-CM | POA: Diagnosis not present

## 2017-07-01 DIAGNOSIS — J984 Other disorders of lung: Secondary | ICD-10-CM | POA: Diagnosis not present

## 2017-07-01 DIAGNOSIS — N281 Cyst of kidney, acquired: Secondary | ICD-10-CM | POA: Diagnosis not present

## 2017-07-01 DIAGNOSIS — I110 Hypertensive heart disease with heart failure: Secondary | ICD-10-CM | POA: Diagnosis not present

## 2017-07-01 DIAGNOSIS — J449 Chronic obstructive pulmonary disease, unspecified: Secondary | ICD-10-CM | POA: Diagnosis not present

## 2017-07-01 DIAGNOSIS — R0602 Shortness of breath: Secondary | ICD-10-CM | POA: Diagnosis not present

## 2017-07-01 DIAGNOSIS — R740 Nonspecific elevation of levels of transaminase and lactic acid dehydrogenase [LDH]: Secondary | ICD-10-CM | POA: Diagnosis not present

## 2017-07-01 DIAGNOSIS — I509 Heart failure, unspecified: Secondary | ICD-10-CM | POA: Diagnosis not present

## 2017-07-01 DIAGNOSIS — I252 Old myocardial infarction: Secondary | ICD-10-CM | POA: Diagnosis not present

## 2017-07-01 DIAGNOSIS — N19 Unspecified kidney failure: Secondary | ICD-10-CM | POA: Diagnosis not present

## 2017-07-01 DIAGNOSIS — K7460 Unspecified cirrhosis of liver: Secondary | ICD-10-CM | POA: Diagnosis not present

## 2017-07-02 ENCOUNTER — Other Ambulatory Visit: Payer: Self-pay

## 2017-07-02 ENCOUNTER — Inpatient Hospital Stay (HOSPITAL_COMMUNITY)
Admission: EM | Admit: 2017-07-02 | Discharge: 2017-07-08 | DRG: 441 | Disposition: A | Discharge status: Skilled Nursing Facility | Payer: Medicare Other | Source: Other Acute Inpatient Hospital | Attending: Internal Medicine | Admitting: Internal Medicine

## 2017-07-02 ENCOUNTER — Other Ambulatory Visit (HOSPITAL_COMMUNITY): Payer: Medicare Other

## 2017-07-02 ENCOUNTER — Encounter (HOSPITAL_COMMUNITY): Payer: Self-pay | Admitting: General Practice

## 2017-07-02 ENCOUNTER — Inpatient Hospital Stay (HOSPITAL_COMMUNITY): Payer: Medicare Other

## 2017-07-02 DIAGNOSIS — I5082 Biventricular heart failure: Secondary | ICD-10-CM | POA: Diagnosis not present

## 2017-07-02 DIAGNOSIS — I252 Old myocardial infarction: Secondary | ICD-10-CM | POA: Diagnosis not present

## 2017-07-02 DIAGNOSIS — I130 Hypertensive heart and chronic kidney disease with heart failure and stage 1 through stage 4 chronic kidney disease, or unspecified chronic kidney disease: Secondary | ICD-10-CM | POA: Diagnosis present

## 2017-07-02 DIAGNOSIS — R278 Other lack of coordination: Secondary | ICD-10-CM | POA: Diagnosis not present

## 2017-07-02 DIAGNOSIS — Z885 Allergy status to narcotic agent status: Secondary | ICD-10-CM

## 2017-07-02 DIAGNOSIS — E876 Hypokalemia: Secondary | ICD-10-CM | POA: Diagnosis present

## 2017-07-02 DIAGNOSIS — I272 Pulmonary hypertension, unspecified: Secondary | ICD-10-CM | POA: Diagnosis not present

## 2017-07-02 DIAGNOSIS — J439 Emphysema, unspecified: Secondary | ICD-10-CM | POA: Diagnosis not present

## 2017-07-02 DIAGNOSIS — K219 Gastro-esophageal reflux disease without esophagitis: Secondary | ICD-10-CM | POA: Diagnosis present

## 2017-07-02 DIAGNOSIS — Y830 Surgical operation with transplant of whole organ as the cause of abnormal reaction of the patient, or of later complication, without mention of misadventure at the time of the procedure: Secondary | ICD-10-CM | POA: Diagnosis present

## 2017-07-02 DIAGNOSIS — K7210 Chronic hepatic failure without coma: Secondary | ICD-10-CM | POA: Diagnosis not present

## 2017-07-02 DIAGNOSIS — I2510 Atherosclerotic heart disease of native coronary artery without angina pectoris: Secondary | ICD-10-CM | POA: Diagnosis not present

## 2017-07-02 DIAGNOSIS — K766 Portal hypertension: Secondary | ICD-10-CM | POA: Diagnosis not present

## 2017-07-02 DIAGNOSIS — R1313 Dysphagia, pharyngeal phase: Secondary | ICD-10-CM | POA: Diagnosis present

## 2017-07-02 DIAGNOSIS — M2550 Pain in unspecified joint: Secondary | ICD-10-CM | POA: Diagnosis not present

## 2017-07-02 DIAGNOSIS — M109 Gout, unspecified: Secondary | ICD-10-CM | POA: Diagnosis present

## 2017-07-02 DIAGNOSIS — Z79891 Long term (current) use of opiate analgesic: Secondary | ICD-10-CM

## 2017-07-02 DIAGNOSIS — J431 Panlobular emphysema: Secondary | ICD-10-CM | POA: Diagnosis not present

## 2017-07-02 DIAGNOSIS — Z7952 Long term (current) use of systemic steroids: Secondary | ICD-10-CM

## 2017-07-02 DIAGNOSIS — J9611 Chronic respiratory failure with hypoxia: Secondary | ICD-10-CM | POA: Diagnosis not present

## 2017-07-02 DIAGNOSIS — Z515 Encounter for palliative care: Secondary | ICD-10-CM | POA: Diagnosis not present

## 2017-07-02 DIAGNOSIS — E785 Hyperlipidemia, unspecified: Secondary | ICD-10-CM | POA: Diagnosis present

## 2017-07-02 DIAGNOSIS — R64 Cachexia: Secondary | ICD-10-CM | POA: Diagnosis present

## 2017-07-02 DIAGNOSIS — R2681 Unsteadiness on feet: Secondary | ICD-10-CM | POA: Diagnosis not present

## 2017-07-02 DIAGNOSIS — I5031 Acute diastolic (congestive) heart failure: Secondary | ICD-10-CM | POA: Diagnosis not present

## 2017-07-02 DIAGNOSIS — Z7189 Other specified counseling: Secondary | ICD-10-CM

## 2017-07-02 DIAGNOSIS — F411 Generalized anxiety disorder: Secondary | ICD-10-CM

## 2017-07-02 DIAGNOSIS — I509 Heart failure, unspecified: Secondary | ICD-10-CM | POA: Diagnosis not present

## 2017-07-02 DIAGNOSIS — T8619 Other complication of kidney transplant: Secondary | ICD-10-CM | POA: Diagnosis not present

## 2017-07-02 DIAGNOSIS — R0602 Shortness of breath: Secondary | ICD-10-CM | POA: Diagnosis not present

## 2017-07-02 DIAGNOSIS — J440 Chronic obstructive pulmonary disease with acute lower respiratory infection: Secondary | ICD-10-CM | POA: Diagnosis not present

## 2017-07-02 DIAGNOSIS — I5043 Acute on chronic combined systolic (congestive) and diastolic (congestive) heart failure: Secondary | ICD-10-CM | POA: Diagnosis not present

## 2017-07-02 DIAGNOSIS — F1010 Alcohol abuse, uncomplicated: Secondary | ICD-10-CM | POA: Diagnosis present

## 2017-07-02 DIAGNOSIS — Z87891 Personal history of nicotine dependence: Secondary | ICD-10-CM

## 2017-07-02 DIAGNOSIS — I4510 Unspecified right bundle-branch block: Secondary | ICD-10-CM | POA: Diagnosis present

## 2017-07-02 DIAGNOSIS — J9811 Atelectasis: Secondary | ICD-10-CM | POA: Diagnosis present

## 2017-07-02 DIAGNOSIS — J449 Chronic obstructive pulmonary disease, unspecified: Secondary | ICD-10-CM | POA: Diagnosis not present

## 2017-07-02 DIAGNOSIS — I5023 Acute on chronic systolic (congestive) heart failure: Secondary | ICD-10-CM | POA: Diagnosis not present

## 2017-07-02 DIAGNOSIS — Z79899 Other long term (current) drug therapy: Secondary | ICD-10-CM

## 2017-07-02 DIAGNOSIS — M6281 Muscle weakness (generalized): Secondary | ICD-10-CM | POA: Diagnosis not present

## 2017-07-02 DIAGNOSIS — E43 Unspecified severe protein-calorie malnutrition: Secondary | ICD-10-CM | POA: Diagnosis present

## 2017-07-02 DIAGNOSIS — R911 Solitary pulmonary nodule: Secondary | ICD-10-CM | POA: Diagnosis present

## 2017-07-02 DIAGNOSIS — I5041 Acute combined systolic (congestive) and diastolic (congestive) heart failure: Secondary | ICD-10-CM | POA: Diagnosis not present

## 2017-07-02 DIAGNOSIS — N179 Acute kidney failure, unspecified: Secondary | ICD-10-CM | POA: Diagnosis not present

## 2017-07-02 DIAGNOSIS — F329 Major depressive disorder, single episode, unspecified: Secondary | ICD-10-CM | POA: Diagnosis present

## 2017-07-02 DIAGNOSIS — Z7951 Long term (current) use of inhaled steroids: Secondary | ICD-10-CM

## 2017-07-02 DIAGNOSIS — K7200 Acute and subacute hepatic failure without coma: Secondary | ICD-10-CM | POA: Diagnosis not present

## 2017-07-02 DIAGNOSIS — Z7982 Long term (current) use of aspirin: Secondary | ICD-10-CM

## 2017-07-02 DIAGNOSIS — F320 Major depressive disorder, single episode, mild: Secondary | ICD-10-CM | POA: Diagnosis not present

## 2017-07-02 DIAGNOSIS — R1312 Dysphagia, oropharyngeal phase: Secondary | ICD-10-CM | POA: Diagnosis not present

## 2017-07-02 DIAGNOSIS — I480 Paroxysmal atrial fibrillation: Secondary | ICD-10-CM | POA: Diagnosis present

## 2017-07-02 DIAGNOSIS — R4702 Dysphasia: Secondary | ICD-10-CM | POA: Diagnosis present

## 2017-07-02 DIAGNOSIS — R748 Abnormal levels of other serum enzymes: Secondary | ICD-10-CM

## 2017-07-02 DIAGNOSIS — K7031 Alcoholic cirrhosis of liver with ascites: Secondary | ICD-10-CM | POA: Diagnosis not present

## 2017-07-02 DIAGNOSIS — I714 Abdominal aortic aneurysm, without rupture: Secondary | ICD-10-CM | POA: Diagnosis present

## 2017-07-02 DIAGNOSIS — D684 Acquired coagulation factor deficiency: Secondary | ICD-10-CM | POA: Diagnosis not present

## 2017-07-02 DIAGNOSIS — D696 Thrombocytopenia, unspecified: Secondary | ICD-10-CM | POA: Diagnosis present

## 2017-07-02 DIAGNOSIS — I5033 Acute on chronic diastolic (congestive) heart failure: Secondary | ICD-10-CM | POA: Diagnosis not present

## 2017-07-02 DIAGNOSIS — F419 Anxiety disorder, unspecified: Secondary | ICD-10-CM | POA: Diagnosis present

## 2017-07-02 DIAGNOSIS — Z955 Presence of coronary angioplasty implant and graft: Secondary | ICD-10-CM

## 2017-07-02 DIAGNOSIS — I708 Atherosclerosis of other arteries: Secondary | ICD-10-CM | POA: Diagnosis present

## 2017-07-02 DIAGNOSIS — I340 Nonrheumatic mitral (valve) insufficiency: Secondary | ICD-10-CM | POA: Diagnosis present

## 2017-07-02 DIAGNOSIS — I2729 Other secondary pulmonary hypertension: Secondary | ICD-10-CM | POA: Diagnosis present

## 2017-07-02 DIAGNOSIS — F17290 Nicotine dependence, other tobacco product, uncomplicated: Secondary | ICD-10-CM | POA: Diagnosis present

## 2017-07-02 DIAGNOSIS — R2689 Other abnormalities of gait and mobility: Secondary | ICD-10-CM | POA: Diagnosis not present

## 2017-07-02 DIAGNOSIS — D509 Iron deficiency anemia, unspecified: Secondary | ICD-10-CM | POA: Diagnosis not present

## 2017-07-02 DIAGNOSIS — R188 Other ascites: Secondary | ICD-10-CM

## 2017-07-02 DIAGNOSIS — I361 Nonrheumatic tricuspid (valve) insufficiency: Secondary | ICD-10-CM | POA: Diagnosis not present

## 2017-07-02 DIAGNOSIS — I739 Peripheral vascular disease, unspecified: Secondary | ICD-10-CM | POA: Diagnosis present

## 2017-07-02 DIAGNOSIS — Z7401 Bed confinement status: Secondary | ICD-10-CM | POA: Diagnosis not present

## 2017-07-02 DIAGNOSIS — I10 Essential (primary) hypertension: Secondary | ICD-10-CM | POA: Diagnosis not present

## 2017-07-02 DIAGNOSIS — E860 Dehydration: Secondary | ICD-10-CM | POA: Diagnosis present

## 2017-07-02 DIAGNOSIS — Z681 Body mass index (BMI) 19 or less, adult: Secondary | ICD-10-CM

## 2017-07-02 DIAGNOSIS — R1310 Dysphagia, unspecified: Secondary | ICD-10-CM | POA: Diagnosis not present

## 2017-07-02 DIAGNOSIS — Z8249 Family history of ischemic heart disease and other diseases of the circulatory system: Secondary | ICD-10-CM

## 2017-07-02 DIAGNOSIS — N182 Chronic kidney disease, stage 2 (mild): Secondary | ICD-10-CM | POA: Diagnosis present

## 2017-07-02 DIAGNOSIS — I5022 Chronic systolic (congestive) heart failure: Secondary | ICD-10-CM | POA: Diagnosis not present

## 2017-07-02 DIAGNOSIS — Z96641 Presence of right artificial hip joint: Secondary | ICD-10-CM | POA: Diagnosis present

## 2017-07-02 DIAGNOSIS — I248 Other forms of acute ischemic heart disease: Secondary | ICD-10-CM | POA: Diagnosis not present

## 2017-07-02 DIAGNOSIS — Z825 Family history of asthma and other chronic lower respiratory diseases: Secondary | ICD-10-CM

## 2017-07-02 DIAGNOSIS — Z9981 Dependence on supplemental oxygen: Secondary | ICD-10-CM

## 2017-07-02 DIAGNOSIS — D689 Coagulation defect, unspecified: Secondary | ICD-10-CM | POA: Diagnosis not present

## 2017-07-02 DIAGNOSIS — Z7902 Long term (current) use of antithrombotics/antiplatelets: Secondary | ICD-10-CM

## 2017-07-02 DIAGNOSIS — L8990 Pressure ulcer of unspecified site, unspecified stage: Secondary | ICD-10-CM

## 2017-07-02 DIAGNOSIS — J9621 Acute and chronic respiratory failure with hypoxia: Secondary | ICD-10-CM

## 2017-07-02 HISTORY — DX: Abnormal results of liver function studies: R94.5

## 2017-07-02 LAB — URINALYSIS, ROUTINE W REFLEX MICROSCOPIC
BILIRUBIN URINE: NEGATIVE
Glucose, UA: NEGATIVE mg/dL
KETONES UR: NEGATIVE mg/dL
LEUKOCYTES UA: NEGATIVE
Nitrite: NEGATIVE
Protein, ur: NEGATIVE mg/dL
Specific Gravity, Urine: 1.011 (ref 1.005–1.030)
pH: 5 (ref 5.0–8.0)

## 2017-07-02 LAB — CBC WITH DIFFERENTIAL/PLATELET
ABS IMMATURE GRANULOCYTES: 0 10*3/uL (ref 0.0–0.1)
BASOS PCT: 0 %
Basophils Absolute: 0 10*3/uL (ref 0.0–0.1)
EOS ABS: 0 10*3/uL (ref 0.0–0.7)
Eosinophils Relative: 0 %
HCT: 37.4 % — ABNORMAL LOW (ref 39.0–52.0)
Hemoglobin: 10.9 g/dL — ABNORMAL LOW (ref 13.0–17.0)
IMMATURE GRANULOCYTES: 0 %
Lymphocytes Relative: 4 %
Lymphs Abs: 0.4 10*3/uL — ABNORMAL LOW (ref 0.7–4.0)
MCH: 23 pg — ABNORMAL LOW (ref 26.0–34.0)
MCHC: 29.1 g/dL — ABNORMAL LOW (ref 30.0–36.0)
MCV: 79.1 fL (ref 78.0–100.0)
Monocytes Absolute: 0.2 10*3/uL (ref 0.1–1.0)
Monocytes Relative: 2 %
NEUTROS ABS: 8.4 10*3/uL — AB (ref 1.7–7.7)
NEUTROS PCT: 94 %
PLATELETS: 107 10*3/uL — AB (ref 150–400)
RBC: 4.73 MIL/uL (ref 4.22–5.81)
RDW: 18.9 % — AB (ref 11.5–15.5)
WBC: 9 10*3/uL (ref 4.0–10.5)

## 2017-07-02 LAB — TYPE AND SCREEN
ABO/RH(D): A POS
ANTIBODY SCREEN: NEGATIVE

## 2017-07-02 LAB — PROTIME-INR
INR: 2.41
PROTHROMBIN TIME: 26 s — AB (ref 11.4–15.2)

## 2017-07-02 LAB — BRAIN NATRIURETIC PEPTIDE: B Natriuretic Peptide: 2731.4 pg/mL — ABNORMAL HIGH (ref 0.0–100.0)

## 2017-07-02 LAB — TROPONIN I
TROPONIN I: 0.16 ng/mL — AB (ref <0.03)
Troponin I: 0.17 ng/mL (ref <0.03)
Troponin I: 0.16 ng/mL (ref <0.03)

## 2017-07-02 LAB — BASIC METABOLIC PANEL
ANION GAP: 13 (ref 5–15)
BUN: 44 mg/dL — ABNORMAL HIGH (ref 8–23)
CALCIUM: 8.7 mg/dL — AB (ref 8.9–10.3)
CO2: 28 mmol/L (ref 22–32)
CREATININE: 1.92 mg/dL — AB (ref 0.61–1.24)
Chloride: 100 mmol/L (ref 98–111)
GFR calc non Af Amer: 35 mL/min — ABNORMAL LOW (ref >60)
GFR, EST AFRICAN AMERICAN: 41 mL/min — AB (ref >60)
Glucose, Bld: 129 mg/dL — ABNORMAL HIGH (ref 70–99)
Potassium: 4 mmol/L (ref 3.5–5.1)
SODIUM: 141 mmol/L (ref 135–145)

## 2017-07-02 LAB — HEPATIC FUNCTION PANEL
ALBUMIN: 3 g/dL — AB (ref 3.5–5.0)
ALK PHOS: 165 U/L — AB (ref 38–126)
ALT: 637 U/L — AB (ref 0–44)
AST: 1093 U/L — ABNORMAL HIGH (ref 15–41)
BILIRUBIN INDIRECT: 1.4 mg/dL — AB (ref 0.3–0.9)
Bilirubin, Direct: 1.6 mg/dL — ABNORMAL HIGH (ref 0.0–0.2)
TOTAL PROTEIN: 6 g/dL — AB (ref 6.5–8.1)
Total Bilirubin: 3 mg/dL — ABNORMAL HIGH (ref 0.3–1.2)

## 2017-07-02 LAB — ACETAMINOPHEN LEVEL

## 2017-07-02 LAB — HIV ANTIBODY (ROUTINE TESTING W REFLEX): HIV SCREEN 4TH GENERATION: NONREACTIVE

## 2017-07-02 LAB — TSH: TSH: 2.552 u[IU]/mL (ref 0.350–4.500)

## 2017-07-02 LAB — ETHANOL

## 2017-07-02 LAB — AMMONIA: Ammonia: 30 umol/L (ref 9–35)

## 2017-07-02 MED ORDER — SENNOSIDES-DOCUSATE SODIUM 8.6-50 MG PO TABS: 1.0000 | ORAL_TABLET | Freq: Every evening | ORAL | Status: DC | PRN

## 2017-07-02 MED ORDER — ALPRAZOLAM 0.25 MG PO TABS
0.2500 mg | ORAL_TABLET | Freq: Two times a day (BID) | ORAL | Status: DC | PRN
  Administered 2017-07-02 – 2017-07-08 (×4): 0.25 mg via ORAL
  Filled 2017-07-02 (×4): qty 1

## 2017-07-02 MED ORDER — IPRATROPIUM-ALBUTEROL 0.5-2.5 (3) MG/3ML IN SOLN
3.0000 mL | RESPIRATORY_TRACT | Status: DC
  Administered 2017-07-02 – 2017-07-05 (×18): 3 mL via RESPIRATORY_TRACT
  Filled 2017-07-02 (×19): qty 3

## 2017-07-02 MED ORDER — BISACODYL 10 MG RE SUPP: 10.0000 mg | Freq: Every day | RECTAL | Status: DC | PRN

## 2017-07-02 MED ORDER — FUROSEMIDE 10 MG/ML IJ SOLN
40.0000 mg | Freq: Two times a day (BID) | INTRAMUSCULAR | Status: DC
  Administered 2017-07-02 – 2017-07-05 (×6): 40 mg via INTRAVENOUS
  Filled 2017-07-02 (×6): qty 4

## 2017-07-02 MED ORDER — SERTRALINE HCL 100 MG PO TABS
100.0000 mg | ORAL_TABLET | Freq: Every day | ORAL | Status: DC
  Administered 2017-07-02 – 2017-07-08 (×7): 100 mg via ORAL
  Filled 2017-07-02 (×7): qty 1

## 2017-07-02 MED ORDER — ONDANSETRON HCL 4 MG PO TABS: 4.0000 mg | ORAL_TABLET | Freq: Four times a day (QID) | ORAL | Status: DC | PRN

## 2017-07-02 MED ORDER — PHYTONADIONE 5 MG PO TABS
5.0000 mg | ORAL_TABLET | Freq: Every day | ORAL | Status: AC
  Administered 2017-07-02 – 2017-07-04 (×3): 5 mg via ORAL
  Filled 2017-07-02 (×4): qty 1

## 2017-07-02 MED ORDER — MORPHINE SULFATE (PF) 2 MG/ML IV SOLN
2.0000 mg | INTRAVENOUS | Status: DC | PRN
  Administered 2017-07-03 – 2017-07-07 (×2): 2 mg via INTRAVENOUS
  Filled 2017-07-02 (×2): qty 1

## 2017-07-02 MED ORDER — CLOPIDOGREL BISULFATE 75 MG PO TABS
75.0000 mg | ORAL_TABLET | Freq: Every day | ORAL | Status: DC
  Administered 2017-07-02: 75 mg via ORAL
  Filled 2017-07-02: qty 1

## 2017-07-02 MED ORDER — SODIUM CHLORIDE 0.9 % IV SOLN: INTRAVENOUS | Status: DC

## 2017-07-02 MED ORDER — FUROSEMIDE 10 MG/ML IJ SOLN
20.0000 mg | Freq: Two times a day (BID) | INTRAMUSCULAR | Status: DC
  Administered 2017-07-02: 20 mg via INTRAVENOUS
  Filled 2017-07-02: qty 2

## 2017-07-02 MED ORDER — ONDANSETRON HCL 4 MG/2ML IJ SOLN: 4.0000 mg | Freq: Four times a day (QID) | INTRAMUSCULAR | Status: DC | PRN

## 2017-07-02 MED ORDER — METOPROLOL TARTRATE 25 MG PO TABS
25.0000 mg | ORAL_TABLET | Freq: Every day | ORAL | Status: DC
  Administered 2017-07-02: 25 mg via ORAL
  Filled 2017-07-02: qty 1

## 2017-07-02 MED ORDER — IPRATROPIUM-ALBUTEROL 0.5-2.5 (3) MG/3ML IN SOLN: 3.0000 mL | Freq: Four times a day (QID) | RESPIRATORY_TRACT | Status: DC | PRN

## 2017-07-02 MED ORDER — HYDROCODONE-ACETAMINOPHEN 5-325 MG PO TABS
1.0000 | ORAL_TABLET | ORAL | Status: DC | PRN
  Administered 2017-07-05: 1 via ORAL
  Administered 2017-07-05 – 2017-07-08 (×4): 2 via ORAL
  Filled 2017-07-02: qty 2
  Filled 2017-07-02: qty 1
  Filled 2017-07-02 (×4): qty 2

## 2017-07-02 MED ORDER — MOMETASONE FURO-FORMOTEROL FUM 100-5 MCG/ACT IN AERO
2.0000 | INHALATION_SPRAY | Freq: Two times a day (BID) | RESPIRATORY_TRACT | Status: DC
  Administered 2017-07-02 – 2017-07-08 (×10): 2 via RESPIRATORY_TRACT
  Filled 2017-07-02 (×2): qty 8.8

## 2017-07-02 MED ORDER — ALBUTEROL SULFATE (2.5 MG/3ML) 0.083% IN NEBU
2.5000 mg | INHALATION_SOLUTION | RESPIRATORY_TRACT | Status: DC | PRN
  Administered 2017-07-05: 2.5 mg via RESPIRATORY_TRACT
  Filled 2017-07-02: qty 3

## 2017-07-02 MED ORDER — PRAVASTATIN SODIUM 20 MG PO TABS
20.0000 mg | ORAL_TABLET | Freq: Every day | ORAL | Status: DC
  Administered 2017-07-02: 20 mg via ORAL
  Filled 2017-07-02: qty 1

## 2017-07-02 NOTE — Consult Note (Addendum)
[facility] Consult: 11:43 AM 07/02/2017  LOS: 0 days    Referring Provider: Dr Ophelia Charter  Primary Care Physician:  Leonia Reader, Barbara Cower, MD Primary Gastroenterologist:  unassigned     Reason for Consultation:  "Liver failure"   HPI: Shaun Schmitt is a 67 y.o. male.  Hx COPD, emphysema.  On 24/7 oxygen.  Quit smoking cigarettes but vapes.  Chronic left subclavian artery occlusion.  CAD, history MI.  CHF.  Previous cardiac stents, on low-dose aspirin and Plavix.  Carotid artery occlusion.  S/P aorto femoropopliteal bypass and renal artery stenting ~ 2004.  Right renal artery stent occluded per angiogram in 2013.  Right ext artery patch angioplasty 2013.   May have had EGD in the past but has never had a colonoscopy.  EGD is not found in care everywhere. Home meds include baby aspirin, Plavix, omeprazole 20 mg daily.  Prednisone 10 mg daily.   Went to the ED in [place] with 1 weeks worth of DOE, resting shortness of breath, weakness, cough, diminished appetite.  Occasional nausea, vomiting.  Occasional solid and liquid dysphagia.  Constipation.  Remote BPR with wiping after hard to pass stool.  Self-reprted 50# weight loss over several months.  Quit drinking alcohol and smoking cigarettes 6 years ago.  Prior to that he was a heavy consumer of alcohol.   Caveat: patient is a poor historian. Tachypneic and alternating bradycardia/tachycardia in the ED.  CT abdomen pelvis without contrast showed bilateral small pleural effusions.  3.4 cm infrarenal AAA, atrophic right kidney.  Liver looked cirrhotic and there was a moderate to large amount of ascites. Ultrasound of the abdomen also revealed ascites mostly perihepatic.  Echogenic liver consistent with steatosis or fibrosis no definite masses. Chest x-ray showed  stable 11 mm nodule right upper lobe. Incomplete right bundle branch block on EKG along with right ventricular hypertrophy.  Sinus rhythm with occasional PVCs  AKI with BUN 40, creatinine 1.5.  Sodium 139 T bili 3.  Alkaline phosphatase 206.   AST/ALT 1290/601 Ammonia less than 9.  APAP level < 10.   WBCs 13.1.  Hgb 11.1.  Platelets 133.  MCV 76. Pro time 22, INR 2.4 pBNP 2270.  As there was no available GI coverage, patient was transferred to [facility]. Care everywhere there are no previous ultrasound or CT or MR imagings of the abdomen pelvis for comparison.  The only care everywhere LFTs are from 2016 when they were normal.  Patient unaware of any diagnosis of liver disease in the past.  Is not aware of liver disease or colon cancer in his family.   Past Medical History:  Diagnosis Date  . Acute on chronic respiratory failure with hypoxia (HCC) 12/28/2014  . Aftercare following surgery of the circulatory system, NEC 12/18/2011  . 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 Arthritis    Gout  . Bronchitis April 23, 2012  . CAD (coronary artery disease) 12/28/2014   Stents x2 on ASA and Plavix  .  Carotid artery occlusion   . Chronic respiratory failure with hypoxia (HCC) 02/16/2015  . COPD (chronic obstructive pulmonary disease) (HCC)   . COPD, very severe (HCC) 12/28/2014  . Elevated LFTs 06/2017  . Emphysema   . History of tobacco abuse 02/16/2015  . Hypertension   . Myocardial infarction (HCC)    X's 2  10-12 YRS AGO @ [place]  . Occlusion and stenosis of carotid artery without mention of cerebral infarction 06/25/2013  . Pain in both knees    Right worse , bilateral Hips and ankles, numbness   . Pancytopenia (HCC) 12/28/2014  . Parainfluenza 12/30/2014  . Peripheral edema 06/26/2017  . Peripheral vascular disease, unspecified (HCC) 06/24/2012  . Protein-calorie malnutrition, mild (HCC) 12/28/2014  . Pulmonary  nodule/lesion, solitary 12/28/2014   RUL 8 mm nodule on OSH CT chest 11/27/14  . PVD (peripheral vascular disease) (HCC) 09/25/2011  . Renal artery stenosis (HCC) 12/07/2002   Stents  . Rhinovirus infection 12/30/2014  . Stenosis of ureter of transplanted kidney    RIGHT KIDNEY 'DOESN'T WORK'  . Tobacco abuse 12/28/2014    Past Surgical History:  Procedure Laterality Date  . ABDOMINAL AORTAGRAM N/A 10/31/2011   Procedure: ABDOMINAL AORTAGRAM;  Surgeon: Larina Earthly, MD;  Location: [facility] CATH LAB;  Service: Cardiovascular;  Laterality: N/A;  . CARDIAC CATHETERIZATION     cath 05/21/00 ([facility]) showed 50% ostial OM, 25-30% mRCA, EF 40-45%)  . JOINT REPLACEMENT  2005   Right Hip  . KNEE SURGERY    . PATCH ANGIOPLASTY  11/26/2011   Procedure: PATCH ANGIOPLASTY;  Surgeon: Larina Earthly, MD;  Location: [facility] OR;  Service: Vascular;  Laterality: Right;  Using Hemashield 0.8cm x 7.6 cm vascular patch.  . PR VEIN BYPASS GRAFT,AORTO-FEM-POP    . REPAIR ILIAC ARTERY  11/26/2011   Procedure: REPAIR ILIAC ARTERY;  Surgeon: Larina Earthly, MD;  Location: [facility] OR;  Service: Vascular;  Laterality: Right;  With patch angioplasty.    Prior to Admission medications   Medication Sig Start Date End Date Taking? Authorizing Provider  aspirin 81 MG tablet Take 81 mg by mouth daily.    [provider]  clonazePAM Scarlette Calico) 0.5 MG tablet  06/23/12   [provider]  clopidogrel (PLAVIX) 75 MG tablet Take 75 mg by mouth daily.    [provider]  Fluticasone-Salmeterol (ADVAIR DISKUS) 100-50 MCG/DOSE AEPB Inhale 1 puff into the lungs every 12 (twelve) hours.    [provider]  hydrochlorothiazide (HYDRODIURIL) 25 MG tablet Take 25 mg by mouth daily.    [provider]  Ipratropium-Albuterol (COMBIVENT RESPIMAT) 20-100 MCG/ACT AERS respimat Inhale 2 puffs into the lungs every 6 (six) hours as needed. For shortness of breath    [provider]  lisinopril  (PRINIVIL,ZESTRIL) 20 MG tablet Take 10 mg by mouth daily.     [provider]  metoprolol tartrate (LOPRESSOR) 25 MG tablet Take 25 mg by mouth daily.     [provider]  Omega-3 Fatty Acids (FISH OIL) 1000 MG CAPS Take by mouth daily.    [provider]  pravastatin (PRAVACHOL) 20 MG tablet Take 20 mg by mouth daily.    [provider]  sertraline (ZOLOFT) 50 MG tablet Take 100 mg by mouth daily.  06/23/12   [provider]  traMADol (ULTRAM) 50 MG tablet Take 1 tablet (50 mg total) by mouth every 6 (six) hours as needed. For pain 11/27/11   Dara Lords, PA-C  Scheduled Meds: . clopidogrel  75 mg Oral Daily  . furosemide  20 mg Intravenous BID  . metoprolol tartrate  25 mg Oral Daily  . mometasone-formoterol  2 puff Inhalation BID  . pravastatin  20 mg Oral Daily  . sertraline  100 mg Oral Daily   Infusions:  PRN Meds: bisacodyl, HYDROcodone-acetaminophen, ipratropium-albuterol, ondansetron **OR** ondansetron (ZOFRAN) IV, senna-docusate   Allergies as of 07/02/2017 - Review Complete 07/02/2017  Allergen Reaction Noted  . Codeine Nausea And Vomiting 09/17/2011    Family History  Problem Relation Age of Onset  . Emphysema Mother   . Hypertension Mother   . Emphysema Father   . Hypertension Father    Social History   Tobacco Use  . Smoking status: Former Smoker    Packs/day: 0.25    Years: 40.00    Pack years: 10.00    Types: E-cigarettes, Cigarettes  . Smokeless tobacco: Never Used  . Tobacco comment: QUIT IN 2013  Substance Use Topics  . Alcohol use: Yes    Comment: OCCASIONAL  . Drug use: No     REVIEW OF SYSTEMS: Constitutional: Weakness, fatigues easily.  Falls frequently. ENT:  No nose bleeds Pulm: Worsening shortness of breath.  Mostly nonproductive cough. CV:  No palpitations, no LE edema.  No chest pain. GU:  No hematuria, no frequency GI:  See HPI Heme: Bruises and develops purple easily on his  arms.  No excessive bleeding. Transfusions: Recalls blood transfusions several years ago at the time of 1 of his hip replacements. Neuro:  No headaches, no peripheral tingling or numbness.  Syncope.  No dizziness.  Her balance.  Needs to lean on things when he moves around and has had falls but none recently. Derm:  No itching, no rash or sores.  Endocrine:  No sweats or chills.  No polyuria or dysuria Immunization: Patient is unaware of recent vaccinations. Travel:  None beyond local counties in last few months.    PHYSICAL EXAM: Vital signs in last 24 hours: Vitals:   07/02/17 0257 07/02/17 0856  BP: (!) 148/80 123/79  Pulse: 84 66  Resp: 20 20  Temp: 97.9 F (36.6 C)   SpO2: 95% (!) 89%   Wt Readings from Last 3 Encounters:  07/02/17 150 lb 4.8 oz (68.2 kg)  06/25/13 175 lb 6.4 oz (79.6 kg)  06/24/12 172 lb (78 kg)    General: Cachectic, chronically ill looking, malnourished appearing WM.  Looks older than stated age. Head: No facial asymmetry or swelling.  Skeletal face ease.  No signs of head trauma. Eyes: Slight scleral icterus.  No conjunctival pallor.  EOMI Ears: Hearing seems intact he is just slow to respond Nose: No congestion or discharge. Mouth: Mucous membranes dry.  Tongue midline.  Poor dentition. Neck: No mass, no thyromegaly, no JVD. Lungs: Clear bilaterally but very diminished.  No cough. Heart: RRR.  No MRG.  S1, S2 present. Abdomen: Soft.  Not tender or distended.  No masses, HSM, bruits, hernias..   Rectal: Deferred Musc/Skeltl: No joint redness or swelling.  Muscular wasting. Extremities: No CCE. Neurologic: Alert.  Oriented to place, self, time.  Very slow response time.  Difficult for him to remember details. Skin: No rash, no sores.  Sallow/ashen coloring. Nodes: No cervical adenopathy. Psych: Pleasant, cooperative.  Calm.  Intake/Output from previous day: No intake/output data recorded. Intake/Output this shift: Total I/O In: 240  [P.O.:240] Out: 400 [Urine:400]  LAB RESULTS: Recent Labs    07/02/17 0415  WBC  9.0  HGB 10.9*  HCT 37.4*  PLT 107*   BMET Lab Results  Component Value Date   NA 141 07/02/2017   NA 135 11/27/2011   NA 138 11/19/2011   K 4.0 07/02/2017   K 4.2 11/27/2011   K 4.5 11/19/2011   CL 100 07/02/2017   CL 102 11/27/2011   CL 100 11/19/2011   CO2 28 07/02/2017   CO2 27 11/27/2011   CO2 27 11/19/2011   GLUCOSE 129 (H) 07/02/2017   GLUCOSE 100 (H) 11/27/2011   GLUCOSE 95 11/19/2011   BUN 44 (H) 07/02/2017   BUN 12 11/27/2011   BUN 16 11/19/2011   CREATININE 1.92 (H) 07/02/2017   CREATININE 1.08 11/27/2011   CREATININE 1.04 11/19/2011   CALCIUM 8.7 (L) 07/02/2017   CALCIUM 8.3 (L) 11/27/2011   CALCIUM 10.0 11/19/2011   LFT Recent Labs    07/02/17 0415  PROT 6.0*  ALBUMIN 3.0*  AST 1,093*  ALT 637*  ALKPHOS 165*  BILITOT 3.0*  BILIDIR 1.6*  IBILI 1.4*   PT/INR Lab Results  Component Value Date   INR 2.41 07/02/2017   INR 1.08 11/19/2011   Hepatitis Panel No results for input(s): HEPBSAG, HCVAB, HEPAIGM, HEPBIGM in the last 72 hours. C-Diff No components found for: CDIFF Lipase  No results found for: LIPASE  Drugs of Abuse  No results found for: LABOPIA, COCAINSCRNUR, LABBENZ, AMPHETMU, THCU, LABBARB   RADIOLOGY STUDIES: Dg Chest Port 1 View  Result Date: 07/02/2017 CLINICAL DATA:  67 year old male with shortness of breath.  COPD. EXAM: PORTABLE CHEST 1 VIEW COMPARISON:  Chest radiograph dated 07/01/2017 FINDINGS: There is emphysematous changes of the lungs. There is an area of pleural thickening and nodularity in the left upper lobe likely related to underlying scarring. This is similar to studies dating back to 2016. Right costophrenic angle density may represent scarring or trace pleural effusion. No definite pneumothorax. Apparent linear lucency along the peripheral right lung likely related to skin fold artifacts as lung parenchyma appear to extend  beyond this line. If there is clinical concern for pneumothorax further evaluation with CT may provide better evaluation. Stable cardiac silhouette. There is traction of the upper mediastinum to the left secondary to scarring. No acute osseous pathology. IMPRESSION: 1. Severe emphysema with areas of subpleural scarring. 2. Right lung lucency most likely artifactual. CT may provide better evaluation if clinically indicated. No interval change since the earlier radiograph. Electronically Signed   By: Elgie CollardArash  Radparvar M.D.   On: 07/02/2017 04:20      IMPRESSION:   *     Probable cirrhosis of the liver, at the very least she has fatty liver.  This in patient with history of heavy alcohol use which stopped around 2013. Current acute transaminitis at levels more c/w ischemia.    *     Advanced COPD/emphysema.  Oxygen dependent.  *     Coronary artery disease.  History MI.  History cardiac stents.  On chronic low-dose aspirin and Plavix.  Not on hold.  *     Coagulopathy.  *    AKI.  Baseline not known  *     Mild anemia.  Microcytic indices.  *     Dysphagia.   PLAN:     *   Hepatitis acute serology panel is pending.  See meds ordered for tomorrow along with PT/INR.  *     Will likely need EGD and colonoscopy for evaluation of his microcytosis and for screening  of esophageal varices/portal hypertension as well as investigation of his dysphagia.   This is not urgent.   In the meantime he can eat so I ordered soft diet.  *    Should we stop the Plavix temporarily, to allow for possible invasive studies and considering coagulopathy.  (note now discontinued but Plavix received at 0930 this AM)  *     Give doses of vitamin K for the next 3 days and watch response of his PT/INR.   Jennye Moccasin  07/02/2017, 11:43 AM Phone [phone]    [facility] Attending   I have taken an interval history, reviewed the chart and examined the patient. I agree with the Advanced Practitioner's note,  impression and recommendations.   He is very ill with signs of liver failure  I am somewhat puzzled by the marked elevation of transaminases - agree that ischemia suggested but why/how? + troponin - maybe had MI await Echo  We do not know etiology of liver disease - some hx of EtOH in past  Given emphysema and liver dz ? Alpha-1 anti-trypsin defic   Await echo ii unhelpful may need labs for that and all other acquired/inherited except not wilson's - too old to have that  Likely has bad R heart failure - cor pulmonale  Will see how this shakes out in next few days and I can prognosticate better but he is severely ill and I am concerned about prognosis  May not do endoscopic evaluation - will see how he does  Iva Boop, MD, [facility] 07/02/2017 7:06 PM

## 2017-07-02 NOTE — Progress Notes (Signed)
[facility] Patient arrived via Care link. Patient was oriented to the unit and Admitting was paged. Patient appeared to be sob but 02 sat was WNL and vitals were stable. Will continue to monitor.     Elsie Lincolnaven Glora Hulgan, RN

## 2017-07-02 NOTE — H&P (Addendum)
History and Physical    Shaun Schmitt ZOX:[id number] DOB: 11/27/1950 DOA: 07/02/2017   PCP: Crist Fat, MD   Patient coming from:  Home    Chief Complaint: Shortness of breath   HPI: Shaun Schmitt is a 67 y.o. male with medical history significant for COPD 3.5 L of oxygen at home, congestive heart failure, GERD, hypertension, anxiety, depression, CAD status post cardiac catheterization, with right renal artery stent as well, remote alcohol abuse, presenting to [facility] with shortness of breath for about 1 week, worse on exertion, generalized weakness, cough, without fever or chills. Denies rhinorrhea or hemoptysis. Denies fevers, chills, night sweats or mucositis.   Denies any chest pain, chest wall pain or palpitations.Denies any sick contacts or recent long distant travels. Denies any abdominal pain. Has decreased appetite but he is unaware of weight loss.  And occasional nausea without vomiting.  He denies dysuria, gross hematuria, or changes since his urine color, or changes in his stools.  Denies dizziness or vertigo. Denies lower extremity swelling.  He denies confusion.  denies any vision changes, double vision or headaches.  To be a heavy drinker, but he takes occasionally a beer.  He quit tobacco many years ago, he does smoke vape.  He denies any recreational drug use.  He does take significant amount of Tylenol for pain.  He is not very compliant with his regular medicines.  At [facility] ER, the patient was tachypneic, and had intermittent periods of bradycardia and tachycardia, and anxious, however, on presentation to [facility] hospital, the patient is reporting feeling better.    ED Course:  BP (!) 148/80 (BP Location: Right Arm)   Pulse 84   Temp 97.9 F (36.6 C) (Oral)   Resp 20   Ht 6\' 1"  (1.854 m)   Wt 68.2 kg (150 lb 4.8 oz)   SpO2 95%   BMI 19.83 kg/m     The patient was noted to have markedly elevated LFTs, with AST 1290, ALT 601, bilirubin of 3, INR was  2.41 with PT 22.7, PTT 26.6.  proBNP was 2270, troponin  0.14 Ammonia less than 9 Tylenol less than 10 White count 13.1, hemoglobin 11.1, platelets 133. BUN 40, creatinine 1.5 CT of the abdomen and pelvis without contrast remarkable for small to moderate right and trace of left pleural effusion, atrophic right kidney, again seen infrarenal abdominal aortic aneurysm was 3.4 cm, and morphologic changes of cirrhosis with moderate to large volume of ascites Chest x-ray showed emphysema, with below since 3 cm disease of the left lung apex as before, and an 11 mm nodular density, which is stable the right upper lobe Ultrasound of the abdomen with moderate ascites, predominantly perihepatic, mildly echogenic liver parenchyma, likely due to steatosis and or fibrosis, no definite liver surface irregularity or masses. Infrarenal 3.2 cm abdominal aortic aneurysm He was transferred to [facility] hospital for continuation of care, in view of acute liver failure EKG sinus rhythm, with occasional PVCs, incomplete right bundle branch block, right ventricular hypertrophy, QTC 459  Review of Systems:  As per HPI otherwise all other systems reviewed and are negative  Past Medical History:  Diagnosis Date  . Acute on chronic respiratory failure with hypoxia (HCC) 12/28/2014  . Aftercare following surgery of the circulatory system, NEC 12/18/2011  . 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 Arthritis    Gout  . Bronchitis April 23, 2012  . CAD (  coronary artery disease) 12/28/2014   Stents x2 on ASA and Plavix  . Carotid artery occlusion   . Chronic respiratory failure with hypoxia (HCC) 02/16/2015  . COPD (chronic obstructive pulmonary disease) (HCC)   . COPD, very severe (HCC) 12/28/2014  . Emphysema   . History of tobacco abuse 02/16/2015  . Hypertension   . Myocardial infarction (HCC)    X's 2  10-12 YRS AGO @ [place]  . Occlusion and stenosis of  carotid artery without mention of cerebral infarction 06/25/2013  . Pain in both knees    Right worse , bilateral Hips and ankles, numbness   . Pancytopenia (HCC) 12/28/2014  . Parainfluenza 12/30/2014  . Peripheral edema 06/26/2017  . Peripheral vascular disease, unspecified (HCC) 06/24/2012  . Protein-calorie malnutrition, mild (HCC) 12/28/2014  . Pulmonary nodule/lesion, solitary 12/28/2014   RUL 8 mm nodule on OSH CT chest 11/27/14  . PVD (peripheral vascular disease) (HCC) 09/25/2011  . Renal artery stenosis (HCC) 12/07/2002   Stents  . Rhinovirus infection 12/30/2014  . Stenosis of ureter of transplanted kidney    RIGHT KIDNEY 'DOESN'T WORK'  . Tobacco abuse 12/28/2014    Past Surgical History:  Procedure Laterality Date  . ABDOMINAL AORTAGRAM N/A 10/31/2011   Procedure: ABDOMINAL AORTAGRAM;  Surgeon: Larina Earthly, MD;  Location: [facility] CATH LAB;  Service: Cardiovascular;  Laterality: N/A;  . CARDIAC CATHETERIZATION     cath 05/21/00 ([facility]) showed 50% ostial OM, 25-30% mRCA, EF 40-45%)  . JOINT REPLACEMENT  2005   Right Hip  . KNEE SURGERY    . PATCH ANGIOPLASTY  11/26/2011   Procedure: PATCH ANGIOPLASTY;  Surgeon: Larina Earthly, MD;  Location: [facility] OR;  Service: Vascular;  Laterality: Right;  Using Hemashield 0.8cm x 7.6 cm vascular patch.  . PR VEIN BYPASS GRAFT,AORTO-FEM-POP    . REPAIR ILIAC ARTERY  11/26/2011   Procedure: REPAIR ILIAC ARTERY;  Surgeon: Larina Earthly, MD;  Location: [facility] OR;  Service: Vascular;  Laterality: Right;  With patch angioplasty.    Social History Social History   Socioeconomic History  . Marital status: Married    Spouse name: Not on file  . Number of children: Not on file  . Years of education: Not on file  . Highest education level: Not on file  Occupational History  . Not on file  Social Needs  . Financial resource strain: Not on file  . Food insecurity:    Worry: Not on file    Inability: Not on file  . Transportation needs:     Medical: Not on file    Non-medical: Not on file  Tobacco Use  . Smoking status: Current Every Day Smoker    Packs/day: 0.25    Years: 40.00    Pack years: 10.00    Types: E-cigarettes, Cigarettes  . Smokeless tobacco: Never Used  Substance and Sexual Activity  . Alcohol use: No  . Drug use: No  . Sexual activity: Not on file  Lifestyle  . Physical activity:    Days per week: Not on file    Minutes per session: Not on file  . Stress: Not on file  Relationships  . Social connections:    Talks on phone: Not on file    Gets together: Not on file    Attends religious service: Not on file    Active member of club or organization: Not on file    Attends meetings of clubs or organizations: Not on file  Relationship status: Not on file  . Intimate partner violence:    Fear of current or ex partner: Not on file    Emotionally abused: Not on file    Physically abused: Not on file    Forced sexual activity: Not on file  Other Topics Concern  . Not on file  Social History Narrative  . Not on file     Allergies  Allergen Reactions  . Codeine Nausea And Vomiting    Upset stomach     Family History  Problem Relation Age of Onset  . Emphysema Mother   . Hypertension Mother   . Emphysema Father   . Hypertension Father        Prior to Admission medications   Medication Sig Start Date End Date Taking? Authorizing Provider  aspirin 81 MG tablet Take 81 mg by mouth daily.    [provider]  clonazePAM Scarlette Calico(KLONOPIN) 0.5 MG tablet  06/23/12   [provider]  clopidogrel (PLAVIX) 75 MG tablet Take 75 mg by mouth daily.    [provider]  Fluticasone-Salmeterol (ADVAIR DISKUS) 100-50 MCG/DOSE AEPB Inhale 1 puff into the lungs every 12 (twelve) hours.    [provider]  hydrochlorothiazide (HYDRODIURIL) 25 MG tablet Take 25 mg by mouth daily.    [provider]  Ipratropium-Albuterol (COMBIVENT RESPIMAT) 20-100 MCG/ACT AERS respimat  Inhale 2 puffs into the lungs every 6 (six) hours as needed. For shortness of breath    [provider]  lisinopril (PRINIVIL,ZESTRIL) 20 MG tablet Take 10 mg by mouth daily.     [provider]  metoprolol tartrate (LOPRESSOR) 25 MG tablet Take 25 mg by mouth daily.     [provider]  Omega-3 Fatty Acids (FISH OIL) 1000 MG CAPS Take by mouth daily.    [provider]  pravastatin (PRAVACHOL) 20 MG tablet Take 20 mg by mouth daily.    [provider]  sertraline (ZOLOFT) 50 MG tablet Take 100 mg by mouth daily.  06/23/12   [provider]  traMADol (ULTRAM) 50 MG tablet Take 1 tablet (50 mg total) by mouth every 6 (six) hours as needed. For pain 11/27/11   Dara Lordshyne, Samantha J, PA-C     Physical Exam:  Vitals:   07/02/17 0257  BP: (!) 148/80  Pulse: 84  Resp: 20  Temp: 97.9 F (36.6 C)  TempSrc: Oral  SpO2: 95%  Weight: 68.2 kg (150 lb 4.8 oz)  Height: 6\' 1"  (1.854 m)   Constitutional: NAD, calm, ill appearing  Eyes: PERRL, lids and conjunctivae normal ENMT: Mucous membranes are moist, without exudate or lesions, poor dentition  Neck: normal, supple, no masses, no thyromegaly Respiratory: decreased breath sounds at the bases  no wheezing, no crackles. Normal respiratory effort  Cardiovascular: Regular rate and rhythm occasional ectopic beats ,  murmur, rubs or gallops.1+ B lower extremity edema. 2+ pedal pulses. No carotid bruits.  Abdomen:  Distended, non tender, No hepatosplenomegaly. Bowel sounds positive.  Musculoskeletal: no clubbing / cyanosis. Moves all extremities Skin: no jaundice, No lesions. SOme areas of old ecchymoses in arms. Pale, cannot appreciate jaundice  Neurologic: Sensation intact  Strength equal in all extremities Psychiatric:   Alert and oriented x 3. Normal mood.     Labs on Admission: I have personally reviewed following labs and imaging studies  CBC: Recent Labs  Lab 07/02/17 0415  WBC 9.0    NEUTROABS 8.4*  HGB 10.9*  HCT 37.4*  MCV 79.1  PLT 107*    Basic Metabolic Panel: Recent Labs  Lab 07/02/17 0415  NA 141  K 4.0  CL 100  CO2 28  GLUCOSE 129*  BUN 44*  CREATININE 1.92*  CALCIUM 8.7*    GFR: Estimated Creatinine Clearance: 37.5 mL/min (A) (by C-G formula based on SCr of 1.92 mg/dL (H)).  Liver Function Tests: Recent Labs  Lab 07/02/17 0415  AST 1,093*  ALT 637*  ALKPHOS 165*  BILITOT 3.0*  PROT 6.0*  ALBUMIN 3.0*   No results for input(s): LIPASE, AMYLASE in the last 168 hours. No results for input(s): AMMONIA in the last 168 hours.  Coagulation Profile: Recent Labs  Lab 07/02/17 0415  INR 2.41    Cardiac Enzymes: Recent Labs  Lab 07/02/17 0415  TROPONINI 0.17*    BNP (last 3 results) No results for input(s): PROBNP in the last 8760 hours.  HbA1C: No results for input(s): HGBA1C in the last 72 hours.  CBG: No results for input(s): GLUCAP in the last 168 hours.  Lipid Profile: No results for input(s): CHOL, HDL, LDLCALC, TRIG, CHOLHDL, LDLDIRECT in the last 72 hours.  Thyroid Function Tests: Recent Labs    07/02/17 0415  TSH 2.552    Anemia Panel: No results for input(s): VITAMINB12, FOLATE, FERRITIN, TIBC, IRON, RETICCTPCT in the last 72 hours.  Urine analysis:    Component Value Date/Time   COLORURINE YELLOW 11/26/2011 0805   APPEARANCEUR CLEAR 11/26/2011 0805   LABSPEC 1.021 11/26/2011 0805   PHURINE 5.0 11/26/2011 0805   GLUCOSEU NEGATIVE 11/26/2011 0805   HGBUR NEGATIVE 11/26/2011 0805   BILIRUBINUR NEGATIVE 11/26/2011 0805   KETONESUR NEGATIVE 11/26/2011 0805   PROTEINUR NEGATIVE 11/26/2011 0805   UROBILINOGEN 0.2 11/26/2011 0805   NITRITE NEGATIVE 11/26/2011 0805   LEUKOCYTESUR NEGATIVE 11/26/2011 0805    Sepsis Labs: @LABRCNTIP (procalcitonin:4,lacticidven:4) )No results found for this or any previous visit (from the past 240 hour(s)).   Radiological Exams on Admission: Dg Chest Port 1  View  Result Date: 07/02/2017 CLINICAL DATA:  67 year old male with shortness of breath.  COPD. EXAM: PORTABLE CHEST 1 VIEW COMPARISON:  Chest radiograph dated 07/01/2017 FINDINGS: There is emphysematous changes of the lungs. There is an area of pleural thickening and nodularity in the left upper lobe likely related to underlying scarring. This is similar to studies dating back to 2016. Right costophrenic angle density may represent scarring or trace pleural effusion. No definite pneumothorax. Apparent linear lucency along the peripheral right lung likely related to skin fold artifacts as lung parenchyma appear to extend beyond this line. If there is clinical concern for pneumothorax further evaluation with CT may provide better evaluation. Stable cardiac silhouette. There is traction of the upper mediastinum to the left secondary to scarring. No acute osseous pathology. IMPRESSION: 1. Severe emphysema with areas of subpleural scarring. 2. Right lung lucency most likely artifactual. CT may provide better evaluation if clinically indicated. No interval change since the earlier radiograph. Electronically Signed   By: Elgie Collard M.D.   On: 07/02/2017 04:20    EKG: Independently reviewed.  Assessment/Plan Principal Problem:   Acute liver failure Active Problems:   PVD (peripheral vascular disease) (HCC)   Peripheral vascular disease, unspecified (HCC)   Acute on chronic respiratory failure with hypoxia (HCC)   Chronic respiratory failure with hypoxia (HCC)   CAD (coronary artery disease)   COPD, very severe (HCC)   History of tobacco abuse    Acute liver failure with ascites. MELD score 27, 19.6 %  3 month mortality   Remote history of alcohol abuse. CT of the abdomen and pelvis without contrast remarkable for  and morphologic changes of cirrhosis with moderate to large volume of ascites. Ultrasound of the abdomen with moderate ascites, predominantly perihepatic, mildly echogenic liver  parenchyma, likely due to steatosis and or fibrosis, no definite liver surface irregularity or masses. he patient was noted to have markedly elevated LFTs, with AST 1290, ALT 601, bilirubin of 3 Ammonia less than 9. Albumin 3  He was transferred to [facility] hospital for continuation of care, in view of acute liver failure Admit to Inpatient tele   I/O and daily weights  IR consult for paracentesis  GI consult is appreciated  Repeat CMET in am  Follow Hep panel results  No tylenol products   COPD on 3.5 L home O2   WBC 13 Chest x-ray showed emphysema, with below since 3 cm disease of the left lung apex as before, and an 11 mm nodular density, which is stable the right upper lobe Continue inhalers  and O2   Will provide nebs prn if wheezing  Follow lung nodule with films as outpatient   Infrarenal 3.2 cm abdominal aortic aneurysm. No bleeding issues Will need to follow with Vascular surgery as outpatient with repeat CT in 6 months   Acute Chronic diastolic CHF   No echo to compare  Films show small pleural effusion on L, mod in R . BNP 2700 Telemetry . TSH normal  monitor I/Os and daily weights prn 02 CXR in am  Echo today  Check BNP in am  Cautious diuresis due to AKI   Acute Kidney Injury likely due to dehydration vs.ACEI, BL 1.04, current 1.92,  Lab Results  Component Value Date   CREATININE 1.92 (H) 07/02/2017   CREATININE 1.08 11/27/2011   CREATININE 1.04 11/19/2011   BMET in am  Hold Ace inhibitors, NSAIDS    Hypertension BP 123/79  Pulse 66  Continue Lopressor but hold ACE I due to AKI   Hyperlipidemia Continue home statins  CAD s/p MI in 2012,   EKG sinus rhythm, with occasional PVCs, incomplete right bundle branch block, right ventricular hypertrophy, QTC 459. Tn 0.14  .Denies CP Serial Tn  Continue PLavix Asa on hold due to acute liver failure   Depression Continue home  Zoloft    DVT prophylaxis:  SCD for now HASBled score 6 Patient is at high risk for major  bleeding. Code Status:    FUll  Family Communication:  Discussed with patient Disposition Plan: Expect patient to be discharged to home after condition improves Consults called:    GI  Admission status: Tele IP   Marlowe Kays, PA-C [facility]   Amion text  [phone]   07/02/2017, 8:52 AM

## 2017-07-02 NOTE — Progress Notes (Addendum)
Patient Osats dropped to the 70's, O2 increased to 4 L (3.5 at baseline) . Essentially asymptomatic for hypoxic event . Transfer to SDU  Code status readdressed, patient confirmed Full Code Status

## 2017-07-02 NOTE — Progress Notes (Signed)
Patient was having SOB wife called me at bedside checked the O2 saturation, it was in the 70's. PA notified and she came to bedside and put a transfer order in for the patient to be transferred to a stepdown unit to be monitor closely.

## 2017-07-02 NOTE — Progress Notes (Signed)
Patient's troponin is elevated 0.17. MD has been notified.   Elsie Lincolnaven Meeah Totino, RN

## 2017-07-03 ENCOUNTER — Encounter (HOSPITAL_COMMUNITY): Payer: Self-pay | Admitting: Radiology

## 2017-07-03 ENCOUNTER — Inpatient Hospital Stay (HOSPITAL_COMMUNITY): Payer: Medicare Other

## 2017-07-03 DIAGNOSIS — I10 Essential (primary) hypertension: Secondary | ICD-10-CM

## 2017-07-03 DIAGNOSIS — I5031 Acute diastolic (congestive) heart failure: Secondary | ICD-10-CM

## 2017-07-03 DIAGNOSIS — J431 Panlobular emphysema: Secondary | ICD-10-CM

## 2017-07-03 DIAGNOSIS — I5033 Acute on chronic diastolic (congestive) heart failure: Secondary | ICD-10-CM

## 2017-07-03 DIAGNOSIS — I714 Abdominal aortic aneurysm, without rupture: Secondary | ICD-10-CM

## 2017-07-03 DIAGNOSIS — F320 Major depressive disorder, single episode, mild: Secondary | ICD-10-CM

## 2017-07-03 DIAGNOSIS — I361 Nonrheumatic tricuspid (valve) insufficiency: Secondary | ICD-10-CM

## 2017-07-03 HISTORY — PX: IR PARACENTESIS: IMG2679

## 2017-07-03 LAB — BODY FLUID CELL COUNT WITH DIFFERENTIAL
Lymphs, Fluid: 26 %
Monocyte-Macrophage-Serous Fluid: 3 % — ABNORMAL LOW (ref 50–90)
NEUTROPHIL FLUID: 71 % — AB (ref 0–25)
Total Nucleated Cell Count, Fluid: 188 cu mm (ref 0–1000)

## 2017-07-03 LAB — COMPREHENSIVE METABOLIC PANEL
ALK PHOS: 147 U/L — AB (ref 38–126)
ALT: 546 U/L — AB (ref 0–44)
AST: 517 U/L — AB (ref 15–41)
Albumin: 2.8 g/dL — ABNORMAL LOW (ref 3.5–5.0)
Anion gap: 10 (ref 5–15)
BUN: 44 mg/dL — AB (ref 8–23)
CHLORIDE: 99 mmol/L (ref 98–111)
CO2: 32 mmol/L (ref 22–32)
CREATININE: 1.51 mg/dL — AB (ref 0.61–1.24)
Calcium: 8.3 mg/dL — ABNORMAL LOW (ref 8.9–10.3)
GFR calc Af Amer: 55 mL/min — ABNORMAL LOW (ref >60)
GFR calc non Af Amer: 47 mL/min — ABNORMAL LOW (ref >60)
Glucose, Bld: 94 mg/dL (ref 70–99)
Potassium: 3 mmol/L — ABNORMAL LOW (ref 3.5–5.1)
SODIUM: 141 mmol/L (ref 135–145)
Total Bilirubin: 2.4 mg/dL — ABNORMAL HIGH (ref 0.3–1.2)
Total Protein: 5.9 g/dL — ABNORMAL LOW (ref 6.5–8.1)

## 2017-07-03 LAB — ALBUMIN, PLEURAL OR PERITONEAL FLUID: Albumin, Fluid: 1.4 g/dL

## 2017-07-03 LAB — ECHOCARDIOGRAM COMPLETE
HEIGHTINCHES: 73 in
WEIGHTICAEL: 2405.66 [oz_av]

## 2017-07-03 LAB — AMYLASE, PLEURAL OR PERITONEAL FLUID: Amylase, Fluid: 21 U/L

## 2017-07-03 LAB — CBC
HCT: 35.4 % — ABNORMAL LOW (ref 39.0–52.0)
HEMOGLOBIN: 10.3 g/dL — AB (ref 13.0–17.0)
MCH: 22.9 pg — ABNORMAL LOW (ref 26.0–34.0)
MCHC: 29.1 g/dL — ABNORMAL LOW (ref 30.0–36.0)
MCV: 78.8 fL (ref 78.0–100.0)
PLATELETS: 105 10*3/uL — AB (ref 150–400)
RBC: 4.49 MIL/uL (ref 4.22–5.81)
RDW: 18.7 % — ABNORMAL HIGH (ref 11.5–15.5)
WBC: 12.4 10*3/uL — AB (ref 4.0–10.5)

## 2017-07-03 LAB — LACTATE DEHYDROGENASE, PLEURAL OR PERITONEAL FLUID: LD, Fluid: 76 U/L — ABNORMAL HIGH (ref 3–23)

## 2017-07-03 LAB — PROTIME-INR
INR: 1.84
Prothrombin Time: 21.1 seconds — ABNORMAL HIGH (ref 11.4–15.2)

## 2017-07-03 LAB — HEPATITIS PANEL, ACUTE
HEP B C IGM: NEGATIVE
HEP B S AG: NEGATIVE
Hep A IgM: NEGATIVE

## 2017-07-03 MED ORDER — POTASSIUM CHLORIDE CRYS ER 20 MEQ PO TBCR
30.0000 meq | EXTENDED_RELEASE_TABLET | ORAL | Status: AC
  Administered 2017-07-03: 30 meq via ORAL
  Filled 2017-07-03: qty 1

## 2017-07-03 MED ORDER — ORAL CARE MOUTH RINSE
15.0000 mL | Freq: Two times a day (BID) | OROMUCOSAL | Status: DC
  Administered 2017-07-03 – 2017-07-08 (×9): 15 mL via OROMUCOSAL

## 2017-07-03 MED ORDER — LIDOCAINE HCL (PF) 2 % IJ SOLN
INTRAMUSCULAR | Status: AC
  Filled 2017-07-03: qty 20

## 2017-07-03 MED ORDER — ASPIRIN EC 81 MG PO TBEC
81.0000 mg | DELAYED_RELEASE_TABLET | Freq: Every day | ORAL | Status: DC
Start: 2017-07-03 — End: 2017-07-08
  Administered 2017-07-04 – 2017-07-08 (×5): 81 mg via ORAL
  Filled 2017-07-03 (×5): qty 1

## 2017-07-03 MED ORDER — SPIRONOLACTONE 25 MG PO TABS
100.0000 mg | ORAL_TABLET | Freq: Every day | ORAL | Status: DC
  Administered 2017-07-04 – 2017-07-08 (×5): 100 mg via ORAL
  Filled 2017-07-03 (×6): qty 4

## 2017-07-03 MED ORDER — CLOPIDOGREL BISULFATE 75 MG PO TABS
75.0000 mg | ORAL_TABLET | Freq: Every day | ORAL | Status: DC
  Administered 2017-07-04 – 2017-07-08 (×5): 75 mg via ORAL
  Filled 2017-07-03 (×5): qty 1

## 2017-07-03 NOTE — Progress Notes (Addendum)
Patient bilateral feet extremely dry and cracked.  Patient complains of discomfort when moving or feet being touched. Wife of patient states that patient feet was like that at home and the treatment nurse said it was ok for them to put Vaseline on his feet for the dryness.  Wife stated patient right Great toe was split across the knuckle from them being swollen like sausages and split.  This area was cleansed and Vaseline gauze wrap applied to affected toe.  Medication given to help with the discomfort.

## 2017-07-03 NOTE — Progress Notes (Signed)
Patient observed coughing after taking in sips of liquids. MD paged, ordered NPO diet and speech evaluation.

## 2017-07-03 NOTE — Progress Notes (Addendum)
Daily Rounding Note  07/03/2017, 1:46 PM  LOS: 1 day   SUBJECTIVE:   Transferred to SDU overnight due to hypoxic parameters which improved with additional [place] oxygen.  Breathing is better  On soft diet.   Abdomen feels better after 2.5 liter tap.  OBJECTIVE:         Vital signs in last 24 hours:    Temp:  [97.4 F (36.3 C)-98.2 F (36.8 C)] 98 F (36.7 C) (06/26 1244) Pulse Rate:  [44-143] 68 (06/26 1244) Resp:  [13-27] 20 (06/26 1244) BP: (94-127)/(65-97) 95/86 (06/26 1244) SpO2:  [87 %-100 %] 87 % (06/26 1244) Weight:  [150 lb 5.7 oz (68.2 kg)] 150 lb 5.7 oz (68.2 kg) (06/26 0317) Last BM Date: 07/02/17 Filed Weights   07/02/17 0257 07/03/17 0317  Weight: 150 lb 4.8 oz (68.2 kg) 150 lb 5.7 oz (68.2 kg)   General: looks better but overall unwell and cachectic, thin, chronically ill looking.     Heart: Irreg, irreg, paired PVCs on tele monitor.   Chest: diminished but clear Abdomen: soft, NT, ND.  BS hypoactive.  Fullness in lower abdomen to right of midline  Extremities: no CCE.  Limbs thin with muscle wasting.  Extensive purpura on arms.   Neuro/Psych:  Oriented x 3.  More alert and improved psychomotor retardation c/w yesterday.    Intake/Output from previous day: 06/25 0701 - 06/26 0700 In: 480 [P.O.:480] Out: 1450 [Urine:1450]   Lab Results: Recent Labs    07/02/17 0415 07/03/17 0414  WBC 9.0 12.4*  HGB 10.9* 10.3*  HCT 37.4* 35.4*  PLT 107* 105*   BMET Recent Labs    07/02/17 0415 07/03/17 0414  NA 141 141  K 4.0 3.0*  CL 100 99  CO2 28 32  GLUCOSE 129* 94  BUN 44* 44*  CREATININE 1.92* 1.51*  CALCIUM 8.7* 8.3*   LFT Recent Labs    07/02/17 0415 07/03/17 0414  PROT 6.0* 5.9*  ALBUMIN 3.0* 2.8*  AST 1,093* 517*  ALT 637* 546*  ALKPHOS 165* 147*  BILITOT 3.0* 2.4*  BILIDIR 1.6*  --   IBILI 1.4*  --    PT/INR Recent Labs    07/02/17 0415 07/03/17 0414  LABPROT 26.0* 21.1*    INR 2.41 1.84   Hepatitis Panel Recent Labs    07/02/17 0415  HEPBSAG Negative  HCVAB <0.1  HEPAIGM Negative  HEPBIGM Negative    Studies/Results: Dg Chest Port 1 View  Result Date: 07/02/2017 CLINICAL DATA:  67 year old male with shortness of breath.  COPD. EXAM: PORTABLE CHEST 1 VIEW COMPARISON:  Chest radiograph dated 07/01/2017 FINDINGS: There is emphysematous changes of the lungs. There is an area of pleural thickening and nodularity in the left upper lobe likely related to underlying scarring. This is similar to studies dating back to 2016. Right costophrenic angle density may represent scarring or trace pleural effusion. No definite pneumothorax. Apparent linear lucency along the peripheral right lung likely related to skin fold artifacts as lung parenchyma appear to extend beyond this line. If there is clinical concern for pneumothorax further evaluation with CT may provide better evaluation. Stable cardiac silhouette. There is traction of the upper mediastinum to the left secondary to scarring. No acute osseous pathology. IMPRESSION: 1. Severe emphysema with areas of subpleural scarring. 2. Right lung lucency most likely artifactual. CT may provide better evaluation if clinically indicated. No interval change since the earlier radiograph. Electronically Signed   By:  Elgie CollardArash  Radparvar M.D.   On: 07/02/2017 04:20   Scheduled Meds: . furosemide  40 mg Intravenous BID  . ipratropium-albuterol  3 mL Nebulization Q4H  . lidocaine      . mouth rinse  15 mL Mouth Rinse BID  . mometasone-formoterol  2 puff Inhalation BID  . phytonadione  5 mg Oral Daily  . potassium chloride  30 mEq Oral Q4H  . sertraline  100 mg Oral Daily   Continuous Infusions: PRN Meds:.albuterol, ALPRAZolam, bisacodyl, HYDROcodone-acetaminophen, morphine injection, ondansetron **OR** ondansetron (ZOFRAN) IV, senna-docusate   ASSESMENT:   *     Probable cirrhosis of the liver, at the very least he has fatty  liver.  This in patient with history of heavy alcohol use which stopped ~ 2013. Acute viral hepatitis serologies are negative.  HIV non-reactive.  Current acute transaminitis at levels more c/w ischemia.   Level.  *    Ascites.  Status post 2.5 L paracentesis within the last couple of hours.  SAAG 1.4, c/w portal htn.   Resulted fluid studies: Albumin 1.4g/dL, LDH 76 (RR 1-613-23) Pending fluid studies include total bilirubin, lipase, cytology, amylase.  Cell count differential were not ordered.  *     Advanced COPD/emphysema.  Oxygen dependent.  *     Coronary artery disease.  History MI.  History cardiac stents.  On chronic low-dose aspirin and Plavix which are on hold.  *   New Dx CHF.  Impressive elevation BNP to 2731.  Echo with LVEF 35 to 40%, grade 1 diastolic dysfunction.  Severe dilation of right ventrical and volume overload.  Moderate increase pulm artery pressures.  Moderate tricuspid and mild mitral regurgitation.    *     Coagulopathy.  Improved after 1 of 3 doses oral Vit K.    *    AKI.  Baseline not known  *     Mild anemia.  Microcytic indices.  *     Dysphagia.  *   Hypokalemia.      PLAN   *  Alpha 1 AT, ANA, AMA, smooth muscle Ab, IgG in a.m. I contacted lab and am adding add on testing for cell count/diff to the peritoneal fluid obtained this morning.  *   Given new dx of cirrhosis, coagulopathy, wonder if Plavix ought not be permanently discontinued?   *  Would like to add aldactone to Lasix.  Should we start with 50 mg daily and drop one of his 2 doses of Lasix to 1 dose/day; ie :aldactone 50, lasix 40/day? Goal would be Aldacton 100/lasix 40 per day.  This would have additional benefit of raising potassium.      Jennye MoccasinSarah Gribbin  07/03/2017, 1:46 PM Phone [phone]    [facility] Attending   I have taken an interval history, reviewed the chart and examined the patient. I agree with the Advanced Practitioner's note, impression and recommendations.    Remains critically  ill but better - choking when drinking so getting SLP eval and back to NPO  I think he probably had an MI and hypoperfusion and liver injury on top of cirrhosis causing the jump in transaminases  I have restarted anti-PLT agents and he needs a cardiology consult - medical management likely best we can do but let's get their input  Does not have SBP  SAAG on asites 1.4 > 1.1 but close - so suspect higher protein from RHF likely   Will f/u tomorrow   Iva Booparl E. Johnice Riebe,  MD, [facility] 07/03/2017 7:41 PM

## 2017-07-03 NOTE — Progress Notes (Signed)
PROGRESS NOTE    Shaun Schmitt  ZOX:[id number] DOB: June 19, 1950 DOA: 07/02/2017 PCP: Crist Fat, MD   Brief Narrative:  67 y.o. male PMHx COPD on home oxygen 3.5 L,, Emphysema,Tobacco abuse,, CHF, HTN, CAD S/P stents x 2, MI x2, renal artery stenosis, S/P RIGHT renal artery stent, EtOH abuse,   Presenting to [facility] with shortness of breath for about 1 week, worse on exertion, generalized weakness, cough, without fever or chills. Denies rhinorrhea or hemoptysis. Denies fevers, chills, night sweats or mucositis.   Denies any chest pain, chest wall pain or palpitations. Denies any sick contacts or recent long distant travels. Denies any abdominal pain. Has decreased appetite but he is unaware of weight loss.  And occasional nausea without vomiting.  He denies dysuria, gross hematuria, or changes since his urine color, or changes in his stools.  Denies dizziness or vertigo. Denies lower extremity swelling.  He denies confusion.  denies any vision changes, double vision or headaches.  To be a heavy drinker, but he takes occasionally a beer.  He quit tobacco many years ago, he does smoke vape.  He denies any recreational drug use.  He does take significant amount of Tylenol for pain.  He is not very compliant with his regular medicines.  At [facility], the patient was tachypneic, and had intermittent periods of bradycardia and tachycardia, and anxious, however, on presentation to [facility] hospital, the patient is reporting feeling better.     Subjective: 6/26 A/O x4, positive acute on chronic S OB, negative CP, negative abdominal pain.  Continues to smoke (vape).   Assessment & Plan:   Principal Problem:   Acute liver failure Active Problems:   PVD (peripheral vascular disease) (HCC)   Peripheral vascular disease, unspecified (HCC)   Acute on chronic respiratory failure with hypoxia (HCC)   Chronic respiratory failure with hypoxia (HCC)   CAD (coronary artery disease)   COPD,  very severe (HCC)   History of tobacco abuse   Pressure injury of skin   Ascites   Microcytic anemia  Acute liver failure with ascites/ MELD score= 27, 19.6% 11-month mortality -Remote Hx EtOH abuse  -6/26 IR paracentesis: 2.5 L fluid aspirated, labs pending -Hold all Tylenol products -Acute hepatitis panel negative -GI following - Awaiting echocardiogram  - Trend ammonia  COPD - On 3.5 L O2 at home. - CXR positive for emphysema see results below - RIGHT lung lucency?  Artifact vs lung nodule once patient more stable obtain chest CT -Duoneb q 4hr -Dulera 100-5 mcg 2 puff BID      Infrarenal 3.2 cm abdominal aortic aneurysm. No bleeding issues -Will need to follow with Vascular surgery as outpatient with repeat CT in 6 months     Acute on chronic diastolic CHF (baseline weight unknown) -Strict in and out -Daily weight -Transfuse for hemoglobin<8 -Echocardiogram pending -Lasix 40 mg BID -Spironolactone 100 mg daily   Essential HTN -See CHF  CAD S/P MI 2012  - EKG sinus rhythm, with occasional PVCs, incomplete right bundle branch block, right ventricular hypertrophy, QTC 459. Tn 0.14  .Denies CP Serial Tn  Continue PLavix Asa on hold due to acute liver failure   Acute kidney injury -Most likely secondary to dehydration -Monitor closely Recent Labs  Lab 07/02/17 0415 07/03/17 0414  CREATININE 1.92* 1.51*  -Improving  HLD -    Depression - Zoloft 100 mg daily   Hypokalemia - Potassium 60 mEq    DVT prophylaxis: SCD Code Status: Full Family  Communication: None Disposition Plan: TBD   Consultants:       Procedures/Significant Events:  CT Abdomen and Pelvis w/o contrast at outside hospital:-Remarkable for  and morphologic changes of cirrhosis with moderate to large volume of ascites.  Ultrasound of the abdomen outside hospital:-moderate ascites, predominantly perihepatic, mildly echogenic liver parenchyma, likely due to steatosis and or  fibrosis, no definite liver surface irregularity or masses. 6/25 PCXR:Severe emphysema with areas of subpleural scarring. 2. Right lung lucency most likely artifactual. CT may provide better evaluation if clinically indicated. No interval change since the earlier radiograph.  6/26 IR paracentesis: 2.5 L of yellow fluid aspirated   I have personally reviewed and interpreted all radiology studies and my findings are as above.  VENTILATOR SETTINGS:    Cultures 6/25 acute hepatitis panel negative 6/25 HIV negative     Antimicrobials: Anti-infectives (From admission, onward)   None      Devices    LINES / TUBES:      Continuous Infusions:   Objective: Vitals:   07/02/17 2318 07/03/17 0046 07/03/17 0317 07/03/17 0432  BP: 127/70  123/69   Pulse: 64  (!) 50   Resp: 18  (!) 27   Temp: 97.8 F (36.6 C)  (!) 97.4 F (36.3 C)   TempSrc:   Oral   SpO2: 97% 95% 92% 98%  Weight:   150 lb 5.7 oz (68.2 kg)   Height:        Intake/Output Summary (Last 24 hours) at 07/03/2017 0741 Last data filed at 07/03/2017 0240 Gross per 24 hour  Intake 480 ml  Output 1200 ml  Net -720 ml   Filed Weights   07/02/17 0257 07/03/17 0317  Weight: 150 lb 4.8 oz (68.2 kg) 150 lb 5.7 oz (68.2 kg)    Examination:  General: A/O x4, positive acute on chronic respiratory distress, cachectic Neck:  Negative scars, masses, torticollis, lymphadenopathy, JVD Lungs: Clear to auscultation bilaterally without wheezes or crackles Cardiovascular: Regular rate and rhythm without murmur gallop or rub normal S1 and S2 Abdomen: negative abdominal pain, positive distention, positive fluid wave, positive soft, bowel sounds, no rebound, no ascites, no appreciable mass Extremities: No significant cyanosis, clubbing, or edema bilateral lower extremities Skin: Negative rashes, lesions, ulcers Psychiatric:  Negative depression, negative anxiety, negative fatigue, negative mania  Central nervous system:   Cranial nerves II through XII intact, tongue/uvula midline, all extremities muscle strength 5/5, sensation intact throughout, negative dysarthria, negative expressive aphasia, negative receptive aphasia.  .     Data Reviewed: Care during the described time interval was provided by me .  I have reviewed this patient's available data, including medical history, events of note, physical examination, and all test results as part of my evaluation.   CBC: Recent Labs  Lab 07/02/17 0415 07/03/17 0414  WBC 9.0 12.4*  NEUTROABS 8.4*  --   HGB 10.9* 10.3*  HCT 37.4* 35.4*  MCV 79.1 78.8  PLT 107* 105*   Basic Metabolic Panel: Recent Labs  Lab 07/02/17 0415 07/03/17 0414  NA 141 141  K 4.0 3.0*  CL 100 99  CO2 28 32  GLUCOSE 129* 94  BUN 44* 44*  CREATININE 1.92* 1.51*  CALCIUM 8.7* 8.3*   GFR: Estimated Creatinine Clearance: 47.7 mL/min (A) (by C-G formula based on SCr of 1.51 mg/dL (H)). Liver Function Tests: Recent Labs  Lab 07/02/17 0415 07/03/17 0414  AST 1,093* 517*  ALT 637* 546*  ALKPHOS 165* 147*  BILITOT 3.0* 2.4*  PROT  6.0* 5.9*  ALBUMIN 3.0* 2.8*   No results for input(s): LIPASE, AMYLASE in the last 168 hours. Recent Labs  Lab 07/02/17 1403  AMMONIA 30   Coagulation Profile: Recent Labs  Lab 07/02/17 0415 07/03/17 0414  INR 2.41 1.84   Cardiac Enzymes: Recent Labs  Lab 07/02/17 0415 07/02/17 0843 07/02/17 1403  TROPONINI 0.17* 0.16* 0.16*   BNP (last 3 results) No results for input(s): PROBNP in the last 8760 hours. HbA1C: No results for input(s): HGBA1C in the last 72 hours. CBG: No results for input(s): GLUCAP in the last 168 hours. Lipid Profile: No results for input(s): CHOL, HDL, LDLCALC, TRIG, CHOLHDL, LDLDIRECT in the last 72 hours. Thyroid Function Tests: Recent Labs    07/02/17 0415  TSH 2.552   Anemia Panel: No results for input(s): VITAMINB12, FOLATE, FERRITIN, TIBC, IRON, RETICCTPCT in the last 72 hours. Urine  analysis:    Component Value Date/Time   COLORURINE YELLOW 07/02/2017 1654   APPEARANCEUR CLEAR 07/02/2017 1654   LABSPEC 1.011 07/02/2017 1654   PHURINE 5.0 07/02/2017 1654   GLUCOSEU NEGATIVE 07/02/2017 1654   HGBUR SMALL (A) 07/02/2017 1654   BILIRUBINUR NEGATIVE 07/02/2017 1654   KETONESUR NEGATIVE 07/02/2017 1654   PROTEINUR NEGATIVE 07/02/2017 1654   UROBILINOGEN 0.2 11/26/2011 0805   NITRITE NEGATIVE 07/02/2017 1654   LEUKOCYTESUR NEGATIVE 07/02/2017 1654   Sepsis Labs: @LABRCNTIP (procalcitonin:4,lacticidven:4)  )No results found for this or any previous visit (from the past 240 hour(s)).       Radiology Studies: Dg Chest Port 1 View  Result Date: 07/02/2017 CLINICAL DATA:  67 year old male with shortness of breath.  COPD. EXAM: PORTABLE CHEST 1 VIEW COMPARISON:  Chest radiograph dated 07/01/2017 FINDINGS: There is emphysematous changes of the lungs. There is an area of pleural thickening and nodularity in the left upper lobe likely related to underlying scarring. This is similar to studies dating back to 2016. Right costophrenic angle density may represent scarring or trace pleural effusion. No definite pneumothorax. Apparent linear lucency along the peripheral right lung likely related to skin fold artifacts as lung parenchyma appear to extend beyond this line. If there is clinical concern for pneumothorax further evaluation with CT may provide better evaluation. Stable cardiac silhouette. There is traction of the upper mediastinum to the left secondary to scarring. No acute osseous pathology. IMPRESSION: 1. Severe emphysema with areas of subpleural scarring. 2. Right lung lucency most likely artifactual. CT may provide better evaluation if clinically indicated. No interval change since the earlier radiograph. Electronically Signed   By: Elgie Collard M.D.   On: 07/02/2017 04:20        Scheduled Meds: . furosemide  40 mg Intravenous BID  . ipratropium-albuterol  3 mL  Nebulization Q4H  . mouth rinse  15 mL Mouth Rinse BID  . mometasone-formoterol  2 puff Inhalation BID  . phytonadione  5 mg Oral Daily  . potassium chloride  30 mEq Oral Q4H  . sertraline  100 mg Oral Daily   Continuous Infusions:   LOS: 1 day    Time spent: 40 minutes    Edwen Mclester, Roselind Messier, MD [facility] Pager [phone]   If 7PM-7AM, please contact night-coverage [email or web address] Password [facility] 07/03/2017, 7:41 AM

## 2017-07-03 NOTE — Care Management Note (Addendum)
Case Management Note  Patient Details  Name: Shaun Schmitt MRN: [id number] Date of Birth: 17-Apr-1950  Subjective/Objective:      Pt admitted with liver failure              Action/Plan:  PTA independent from home with wife. Pt is on home oxygen 3.5 liter . Pt is now s/p paracentesis - CM will continue to follow for discharge needs   Expected Discharge Date:                  Expected Discharge Plan:     In-House Referral:  Clinical Social Work  Discharge planning Services  CM Consult  Post Acute Care Choice:    Choice offered to:     DME Arranged:    DME Agency:     HH Arranged:    HH Agency:     Status of Service:     If discussed at [employer] Meetings, dates discussed:    Additional Comments:  Cherylann ParrClaxton, Abrian Hanover S, RN 07/03/2017, 3:44 PM

## 2017-07-03 NOTE — Procedures (Signed)
Ultrasound-guided diagnostic and therapeutic paracentesis performed yielding 2.5 liters of yellow fluid. No immediate complications. A portion of the fluid was sent to the lab for preordered studies.

## 2017-07-03 NOTE — Progress Notes (Signed)
  Echocardiogram 2D Echocardiogram has been performed.  Tye SavoyCasey N Giannah Zavadil 07/03/2017, 11:42 AM

## 2017-07-04 ENCOUNTER — Inpatient Hospital Stay (HOSPITAL_COMMUNITY): Payer: Medicare Other

## 2017-07-04 DIAGNOSIS — I5041 Acute combined systolic (congestive) and diastolic (congestive) heart failure: Secondary | ICD-10-CM

## 2017-07-04 DIAGNOSIS — R4702 Dysphasia: Secondary | ICD-10-CM

## 2017-07-04 DIAGNOSIS — D689 Coagulation defect, unspecified: Secondary | ICD-10-CM

## 2017-07-04 DIAGNOSIS — I5023 Acute on chronic systolic (congestive) heart failure: Secondary | ICD-10-CM

## 2017-07-04 DIAGNOSIS — R188 Other ascites: Secondary | ICD-10-CM

## 2017-07-04 DIAGNOSIS — I739 Peripheral vascular disease, unspecified: Secondary | ICD-10-CM

## 2017-07-04 DIAGNOSIS — R1310 Dysphagia, unspecified: Secondary | ICD-10-CM

## 2017-07-04 DIAGNOSIS — I272 Pulmonary hypertension, unspecified: Secondary | ICD-10-CM

## 2017-07-04 DIAGNOSIS — J9611 Chronic respiratory failure with hypoxia: Secondary | ICD-10-CM

## 2017-07-04 LAB — CBC
HEMATOCRIT: 34.3 % — AB (ref 39.0–52.0)
Hemoglobin: 9.9 g/dL — ABNORMAL LOW (ref 13.0–17.0)
MCH: 23.1 pg — ABNORMAL LOW (ref 26.0–34.0)
MCHC: 28.9 g/dL — ABNORMAL LOW (ref 30.0–36.0)
MCV: 80 fL (ref 78.0–100.0)
PLATELETS: 84 10*3/uL — AB (ref 150–400)
RBC: 4.29 MIL/uL (ref 4.22–5.81)
RDW: 18.6 % — AB (ref 11.5–15.5)
WBC: 9 10*3/uL (ref 4.0–10.5)

## 2017-07-04 LAB — AMMONIA: AMMONIA: 12 umol/L (ref 9–35)

## 2017-07-04 LAB — LIPID PANEL
Cholesterol: 109 mg/dL (ref 0–200)
HDL: 27 mg/dL — ABNORMAL LOW (ref >40)
LDL CALC: 64 mg/dL (ref 0–99)
Total CHOL/HDL Ratio: 4 RATIO
Triglycerides: 89 mg/dL (ref <150)
VLDL: 18 mg/dL (ref 0–40)

## 2017-07-04 LAB — BASIC METABOLIC PANEL
ANION GAP: 8 (ref 5–15)
BUN: 30 mg/dL — ABNORMAL HIGH (ref 8–23)
CALCIUM: 8.1 mg/dL — AB (ref 8.9–10.3)
CHLORIDE: 99 mmol/L (ref 98–111)
CO2: 36 mmol/L — AB (ref 22–32)
CREATININE: 1.16 mg/dL (ref 0.61–1.24)
GFR calc non Af Amer: >60 mL/min (ref >60)
Glucose, Bld: 85 mg/dL (ref 70–99)
Potassium: 3.6 mmol/L (ref 3.5–5.1)
SODIUM: 143 mmol/L (ref 135–145)

## 2017-07-04 LAB — PROTIME-INR
INR: 1.6
PROTHROMBIN TIME: 18.9 s — AB (ref 11.4–15.2)

## 2017-07-04 LAB — MITOCHONDRIAL ANTIBODIES: Mitochondrial M2 Ab, IgG: <20 Units (ref 0.0–20.0)

## 2017-07-04 LAB — ANTI-SMOOTH MUSCLE ANTIBODY, IGG: F-Actin IgG: 16 Units (ref 0–19)

## 2017-07-04 LAB — MAGNESIUM: MAGNESIUM: 1.8 mg/dL (ref 1.7–2.4)

## 2017-07-04 MED ORDER — CARVEDILOL 3.125 MG PO TABS
3.1250 mg | ORAL_TABLET | Freq: Two times a day (BID) | ORAL | Status: DC
  Administered 2017-07-04 – 2017-07-08 (×9): 3.125 mg via ORAL
  Filled 2017-07-04 (×9): qty 1

## 2017-07-04 NOTE — Progress Notes (Addendum)
Modified Barium Swallow Progress Note  Patient Details  Name: Shaun Schmitt MRN: [id number] Date of Birth: 28-Aug-1950  Today's Date: 07/04/2017  Modified Barium Swallow completed.  Full report located under Chart Review in the Imaging Section.  Brief recommendations include the following:  Clinical Impression  Pt presenting with a moderate oral, severe pharyngeal dysphagia, suspect chronic. Oral phase characterized by piecemeal swallowing and delayed oral transit along with premature spillage of liquid consistencies; pharyngeal phase showed decreased anterior laryngeal movement/ reduced epiglottic deflection and reduced airway closure resulting in frank, silent aspiration of thin liquids before the swallow, penetration of nectar thick liquids before swallow and aspiration post-swallow which appeared to be from vallecular residuals. Cued cough was ineffective in clearing aspirated material. Pt with persistent moderate residuals in the vallecula and pyriform sinuses; when cued to swallow a 2nd time pt reported he did not have enough saliva to swallow. Pt did tolerate nectar-thick liquids by teaspoon with only flash penetration; this consistency also helped to clear some of the vallecula residuals. Chin tuck and head turn were attempted but unfortunately did not appear effective in prevention of material entering airway. For now, recommend full liquid diet with liquids thickened to nectar by teaspoon only, also continue to allow ice chips; however pt will continue to be at risk of aspirating due to post-swallow residuals. Will appreciate palliative consult as it seems pt is not likely to comply with altered diet; states "I think I'm nearing the end and this isn't how I wanted it to go" and has not been compliant with thickened liquids in the past. Will continue to follow for diet tolerance/ education, consider education regarding [place] Levels[employer]razier free water protocol.    Swallow Evaluation Recommendations   Recommended Consults: (Palliative care)   SLP Diet Recommendations: Nectar thick liquid;Other (Comment)(Full liquid)   Liquid Administration via: Spoon   Medication Administration: Crushed with puree   Supervision: Patient able to self feed;Full supervision/cueing for compensatory strategies   Compensations: Slow rate;Small sips/bites   Postural Changes: Remain semi-upright after after feeds/meals (Comment);Seated upright at 90 degrees   Oral Care Recommendations: Oral care before and after PO   Other Recommendations: Clarify dietary restrictions    Metro KungAmy K Oleksiak, MA, CCC-SLP 07/04/2017,3:10 PM  [phone]

## 2017-07-04 NOTE — Clinical Social Work Placement (Signed)
   CLINICAL SOCIAL WORK PLACEMENT  NOTE  Date:  07/04/2017  Patient Details  Name: Shaun Schmitt MRN: [id number] Date of Birth: [birth year]  Clinical Social Work is seeking post-discharge placement for this patient at the Skilled  Nursing Facility level of care (*CSW will initial, date and re-position this form in  chart as items are completed):  Yes   Patient/family provided with [facility] Clinical Social Work Department's list of facilities offering this level of care within the geographic area requested by the patient (or if unable, by the patient's family).  Yes   Patient/family informed of their freedom to choose among providers that offer the needed level of care, that participate in Medicare, Medicaid or managed care program needed by the patient, have an available bed and are willing to accept the patient.  Yes   Patient/family informed of [facility] ownership interest [facility] and [facility], as well as of the fact that they are under no obligation to receive care at these facilities.  PASRR submitted to EDS on 07/04/17     PASRR number received on       Existing PASRR number confirmed on 07/04/17     FL2 transmitted to all facilities in geographic area requested by pt/family on 07/04/17     FL2 transmitted to all facilities within larger geographic area on       Patient informed that his/her managed care company has contracts with or will negotiate with certain facilities, including the following:            Patient/family informed of bed offers received.  Patient chooses bed at       Physician recommends and patient chooses bed at      Patient to be transferred to   on  .  Patient to be transferred to facility by       Patient family notified on   of transfer.  Name of family member notified:        PHYSICIAN Please sign FL2     Additional Comment:    _______________________________________________ Margarito LinerSarah C Kamy Poinsett, LCSW 07/04/2017,  4:35 PM

## 2017-07-04 NOTE — Clinical Social Work Note (Signed)
Clinical Social Work Assessment  Patient Details  Name: Shaun Schmitt MRN: [id number] Date of Birth: 1950-04-03  Date of referral:  07/04/17               Reason for consult:  Facility Placement, Discharge Planning                Permission sought to share information with:  Facility Sport and exercise psychologist, Family Supports Permission granted to share information::  Yes, Verbal Permission Granted  Name::     Shaun Schmitt  Agency::  SNF's  Relationship::  Wife  Contact Information:  [phone]  Housing/Transportation Living arrangements for the past 2 months:  [place] of Information:  Patient, Medical Team, Spouse Patient Interpreter Needed:  None Criminal Activity/Legal Involvement Pertinent to Current Situation/Hospitalization:  No - Comment as needed Significant Relationships:  Spouse Lives with:  Spouse Do you feel safe going back to the place where you live?  Yes Need for family participation in patient care:  Yes (Comment)  Care giving concerns:  PT recommending SNF once medically stable for discharge.   Social Worker assessment / plan:  CSW met with patient. Wife at bedside. CSW introduced role and explained that PT recommendations would be discussed. Patient's wife is agreeable to SNF placement. Patient is agreeable to CSW looking into it. Preferences are Management consultant. [employer] [place] will review referral and call CSW in the morning with decision. Patient was a patient at [facility] for 60 days about two years ago and went to [facility] for 3 days before going home. He was anxious to get home and appears anxious to get home this admission as well. No further concerns. CSW encouraged patient and his wife to contact CSW as needed. CSW will continue to follow patient and his wife for support and facilitate discharge to SNF once medically stable.  Employment status:  Disabled (Comment on whether or not currently  receiving Disability) Insurance information:  Managed Medicare PT Recommendations:  [place] / Referral to community resources:  [place]  Patient/Family's Response to care:  Patient willing to consider SNF. Patient's wife supportive and involved in patient's care. Patient and his wife appreciated social work intervention.  Patient/Family's Understanding of and Emotional Response to Diagnosis, Current Treatment, and Prognosis:  Patient and his wife have a good understanding of the reason for admission and his need for continued therapy after discharge. Patient and his wife appears happy with hospital care.  Emotional Assessment Appearance:  Appears stated age Attitude/Demeanor/Rapport:  Engaged, Gracious Affect (typically observed):  Accepting, Appropriate, Calm, Pleasant Orientation:  Oriented to Self, Oriented to Place, Oriented to  Time, Oriented to Situation Alcohol / Substance use:  Tobacco Use Psych involvement (Current and /or in the community):  No (Comment)  Discharge Needs  Concerns to be addressed:  Care Coordination Readmission within the last 30 days:  No Current discharge risk:  Dependent with Mobility Barriers to Discharge:  Ship broker, Continued Medical Work up   Candie Chroman, LCSW 07/04/2017, 4:32 PM

## 2017-07-04 NOTE — [place] FL2 (Signed)
[place] MEDICAID FL2 LEVEL OF CARE SCREENING TOOL     IDENTIFICATION  Patient Name: Shaun Schmitt Birthdate: 07-13-1950 Sex: male Admission Date (Current Location): 07/02/2017  [facility] and [place] Number:  [employer] and Address:  [facility] [place], [address], [place], [place] [address]      Provider Number: [id number]  Attending Physician Name and Address:  Drema Dallas, MD  Relative Name and Phone Number:       Current Level of Care: Hospital Recommended Level of Care: Skilled Nursing Facility Prior Approval Number:    Date Approved/Denied:   PASRR Number: [id number]  Discharge Plan: SNF    Current Diagnoses: Patient Active Problem List   Diagnosis Date Noted  . Acute liver failure 07/02/2017  . Pressure injury of skin 07/02/2017  . Ascites   . Microcytic anemia   . Peripheral edema 06/26/2017  . Chronic respiratory failure with hypoxia (HCC) 02/16/2015  . History of tobacco abuse 02/16/2015  . Parainfluenza 12/30/2014  . Rhinovirus infection 12/30/2014  . Acute on chronic respiratory failure with hypoxia (HCC) 12/28/2014  . Arterial occlusion 12/28/2014  . CAD (coronary artery disease) 12/28/2014  . COPD, very severe (HCC) 12/28/2014  . Pancytopenia (HCC) 12/28/2014  . Protein-calorie malnutrition, mild (HCC) 12/28/2014  . Pulmonary nodule/lesion, solitary 12/28/2014  . Tobacco abuse 12/28/2014  . Hypertension 12/28/2014  . Occlusion and stenosis of carotid artery without mention of cerebral infarction 06/25/2013  . Peripheral vascular disease, unspecified (HCC) 06/24/2012  . Aftercare following surgery of the circulatory system, NEC 12/18/2011  . PVD (peripheral vascular disease) (HCC) 09/25/2011    Orientation RESPIRATION BLADDER Height & Weight     Self, Time, Situation, Place  O2(Nasal Canula 4 L) Continent Weight: 138 lb 0.1 oz (62.6 kg) Height:  6\' 1"  (185.4 cm)  BEHAVIORAL SYMPTOMS/MOOD NEUROLOGICAL  BOWEL NUTRITION STATUS  (None) (None) Continent Diet(Full liquid. Fluid nectar thick. Meds crushed in puree, liquids by teaspoon.)  AMBULATORY STATUS COMMUNICATION OF NEEDS Skin   Limited Assist Verbally Skin abrasions, Bruising, PU Stage and Appropriate Care(Cracking, Excoriated, MASD, Skin tear.) PU Stage 1 Dressing: (Posterior medial sacrum: Foam prn.)                     Personal Care Assistance Level of Assistance  Bathing, Feeding, Dressing Bathing Assistance: Limited assistance Feeding assistance: Limited assistance Dressing Assistance: Limited assistance     Functional Limitations Info  Sight, Hearing, Speech Sight Info: Adequate Hearing Info: Adequate Speech Info: Adequate    SPECIAL CARE FACTORS FREQUENCY  PT (By licensed PT), OT (By licensed OT), Speech therapy     PT Frequency: 5 x week OT Frequency: 5 x week     Speech Therapy Frequency: 5 x week      Contractures Contractures Info: Not present    Additional Factors Info  Code Status, Allergies Code Status Info: Full Allergies Info: Codeine           Current Medications (07/04/2017):  This is the current hospital active medication list Current Facility-Administered Medications  Medication Dose Route Frequency Provider Last Rate Last Dose  . albuterol (PROVENTIL) (2.5 MG/3ML) 0.083% nebulizer solution 2.5 mg  2.5 mg Nebulization Q2H PRN Jonah Blue, MD      . ALPRAZolam Prudy Feeler) tablet 0.25 mg  0.25 mg Oral BID PRN Marcos Eke, PA-C   0.25 mg at 07/04/17 1018  . aspirin EC tablet 81 mg  81 mg Oral Daily Stan Head  E, MD   81 mg at 07/04/17 1008  . bisacodyl (DULCOLAX) suppository 10 mg  10 mg Rectal Daily PRN Marlowe KaysWertman, Sara E, PA-C      . carvedilol (COREG) tablet 3.125 mg  3.125 mg Oral BID WC Drema DallasWoods, Curtis J, MD   3.125 mg at 07/04/17 1314  . clopidogrel (PLAVIX) tablet 75 mg  75 mg Oral Daily Iva BoopGessner, Carl E, MD   75 mg at 07/04/17 1008  . furosemide (LASIX) injection 40 mg  40 mg  Intravenous BID Jonah BlueYates, Jennifer, MD   40 mg at 07/04/17 0907  . HYDROcodone-acetaminophen (NORCO/VICODIN) 5-325 MG per tablet 1-2 tablet  1-2 tablet Oral Q4H PRN Marlowe KaysWertman, Sara E, PA-C      . ipratropium-albuterol (DUONEB) 0.5-2.5 (3) MG/3ML nebulizer solution 3 mL  3 mL Nebulization Q4H Jonah BlueYates, Jennifer, MD   3 mL at 07/04/17 1514  . MEDLINE mouth rinse  15 mL Mouth Rinse BID Jonah BlueYates, Jennifer, MD   15 mL at 07/04/17 1009  . mometasone-formoterol (DULERA) 100-5 MCG/ACT inhaler 2 puff  2 puff Inhalation BID Marcos EkeWertman, Sara E, PA-C   2 puff at 07/04/17 0749  . morphine 2 MG/ML injection 2 mg  2 mg Intravenous Q2H PRN Jonah BlueYates, Jennifer, MD   2 mg at 07/03/17 1930  . ondansetron (ZOFRAN) tablet 4 mg  4 mg Oral Q6H PRN Marcos EkeWertman, Sara E, PA-C       Or  . ondansetron [facility](ZOFRAN) injection 4 mg  4 mg Intravenous Q6H PRN Marcos EkeWertman, Sara E, PA-C      . senna-docusate (Senokot-S) tablet 1 tablet  1 tablet Oral QHS PRN Marcos EkeWertman, Sara E, PA-C      . sertraline (ZOLOFT) tablet 100 mg  100 mg Oral Daily Marlowe KaysWertman, Sara E, PA-C   100 mg at 07/04/17 1008  . spironolactone (ALDACTONE) tablet 100 mg  100 mg Oral Daily Iva BoopGessner, Carl E, MD   100 mg at 07/04/17 1007     Discharge Medications: Please see discharge summary for a list of discharge medications.  Relevant Imaging Results:  Relevant Lab Results:   Additional Information SS#: [id number]  Margarito LinerSarah C Kaedence Connelly, LCSW

## 2017-07-04 NOTE — Consult Note (Addendum)
Cardiology Consultation:   Patient ID: [facility]; [id number]; October 02, 1950   Admit date: 07/02/2017 Date of Consult: 07/04/2017  Primary Care Provider: Crist Fat, MD Primary Cardiologist: Dr Verita Lamb    Patient Profile:   Sh[facility] is a 67 y.o. male with a hx of severe COPD with chronic respiratory failure on home oxygen, tobacco abuse, alcohol abuse, CAD with 2 previous MIs status post DES x2, hypertension, CKD, carotid artery stenosis, PVD status post several peripheral interventions and renal artery stent in 2004 who is being seen today for the evaluation of CHF at the request of Dr. Joseph Art.  History of Present Illness:   [facility] presented to [facility] with a one-week history of shortness of breath, worse on exertion, generalized weakness, and cough.  He was found to be tachypneic and had intermittent periods of bradycardia and tachycardia and was anxious.  He was transferred to [facility] for further evaluation and treatment and was reportedly feeling better at that time.  Has a history of "very severe COPD" followed at [facility], last seen in 2017, followed by pulmonology and [place]. The patient is treated with oxygen therapy, inhalers, albuterol nebulizer.  The patient's wife reports that the patient was told that he had 2 weeks to live after hospitalization in 2016 for lung issues.  At that time he underwent a lung biopsy that showed pneumonia, no cancer.  She also reports that the patient only has 1 functioning kidney at this time.  The patient was a prior heavy smoker 1-1-1/2 packs/day.  He quit smoking cigarettes in 2014 and is now continuing to use E cigarettes with nicotine.  He is a prior heavy alcohol user but has had none in several years.  Sh[facility] is a thin frail gentleman.  He has lost about 60 pounds in the last year and has been essentially chair bound. He denies any prior history of atrial fibrillation but  thinks he may have had an irregular heartbeat.  He and his wife are providing information.  They state that he had 2 heart attacks between 2004 and 2005 but they do not think that he had any stent placement.  He also was on Coumadin around that time per the patient report, but not recently.  The patient has not followed by a cardiologist.  He began to feel bad about 2 to 2-1/2 weeks ago with increased shortness of breath, weakness, lower extremity swelling.  His wife notes that his lower extremity swelling began intermittently in April and became more constant and worse in May.  Lasix was added by his PCP in June.  They report that an echocardiogram was done in [place], ordered by Dr. Debbe Bales, about 2 weeks ago that showed EF 45-50%.   In the last few days the patient had worse shortness of breath, weakness, lower extremity edema, and abdominal distention.  He has had some mild chest tightness but prior to that he had no chest pain/pressure/tightness.  Echocardiogram done yesterday showed moderately reduced LV systolic function with EF 35-40%, no regional wall motion abnormalities, grade 1 diastolic dysfunction, septal wall motion with abnormal function and dyssynchrony consistent with bundle branch block and right ventricular pressure and volume overload.  There was also mild mitral regurgitation and severely dilated right ventricle, PA peak pressure 53 mmHg.  The patient is status post right external iliac artery patch angioplasty in 11/2011, right to left fem-fem bypass grafting in 2004, right renal artery stent in 2004,  known right SFA occlusion per angiogram in 2013. Carotid Dopplers done in 06/2013 showed less than 40% stenosis of the R ICA and 40-59% stenosis of the LICA along with findings suggestive of subclavian steal syndrome.  Left subclavian artery occlusion per CT at outside hospital in 11/2014.  The patient has been followed by vein and vascular surgery but has not been seen since 2015.   Follow-up carotid Dopplers were ordered but were not completed.  He is treated with aspirin 81 mg, Plavix 75 mg and statin.  Past Medical History:  Diagnosis Date  . 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 Arthritis    Gout  . Bronchitis April 23, 2012  . CAD (coronary artery disease) 12/28/2014   Stents x2 on ASA and Plavix  . Chronic respiratory failure with hypoxia (HCC) 02/16/2015  . COPD, very severe (HCC) 12/28/2014  . Elevated LFTs 06/2017  . History of tobacco abuse 02/16/2015  . Hypertension   . Occlusion and stenosis of carotid artery without mention of cerebral infarction 06/25/2013  . Pain in both knees    Right worse , bilateral Hips and ankles, numbness   . Pancytopenia (HCC) 12/28/2014  . Peripheral edema 06/26/2017  . Protein-calorie malnutrition, mild (HCC) 12/28/2014  . Pulmonary nodule/lesion, solitary 12/28/2014   RUL 8 mm nodule on OSH CT chest 11/27/14  . PVD (peripheral vascular disease) (HCC) 09/25/2011  . Renal artery stenosis (HCC) 12/07/2002   renal artery stenting 2004 and found right renal artery stent occlusion on angiography in 2013.    Marland Kitchen Rhinovirus infection 12/30/2014    Past Surgical History:  Procedure Laterality Date  . ABDOMINAL AORTAGRAM N/A 10/31/2011   Procedure: ABDOMINAL AORTAGRAM;  Surgeon: Larina Earthly, MD;  Location[facility] CATH LAB;  Service: Cardiovascular;  Laterality: N/A;  . CARDIAC CATHETERIZATION     cath 05/21/00 ([facility]) showed 50% ostial OM, 25-30% mRCA, EF 40-45%)  . IR PARACENTESIS  07/03/2017  . JOINT REPLACEMENT  2005   Right Hip  . KNEE SURGERY    . PATCH ANGIOPLASTY  11/26/2011   Procedure: PATCH ANGIOPLASTY;  Surgeon: Larina Earthly, MD;  Location: [facility] OR;  Service: Vascular;  Laterality: Right;  Using Hemashield 0.8cm x 7.6 cm vascular patch.  . PR VEIN BYPASS GRAFT,AORTO-FEM-POP    . REPAIR ILIAC ARTERY  11/26/2011   Procedure: REPAIR ILIAC ARTERY;  Surgeon:  Larina Earthly, MD;  Location: [facility] OR;  Service: Vascular;  Laterality: Right;  With patch angioplasty.     Home Medications:  Prior to Admission medications   Medication Sig Start Date End Date Taking? Authorizing Provider  ALPRAZolam (XANAX) 0.25 MG tablet Take 0.125-0.25 mg by mouth See admin instructions. 0.125mg  in the morning and 0.25mg  in the evening   Yes [provider]  antiseptic oral rinse (BIOTENE) LIQD 15 mLs by Mouth Rinse route as needed for dry mouth. Biotene Mouth Spray   Yes [provider]  aspirin 81 MG tablet Take 81 mg by mouth daily.   Yes [provider]  cetirizine (ZYRTEC) 10 MG tablet Take 10 mg by mouth every evening.   Yes [provider]  cholecalciferol (VITAMIN D) 1000 units tablet Take 1,000 Units by mouth every morning.   Yes [provider]  clopidogrel (PLAVIX) 75 MG tablet Take 75 mg by mouth every evening.    Yes [provider]  Fluticasone-Salmeterol (ADVAIR DISKUS) 250-50 MCG/DOSE AEPB Inhale 1 puff  into the lungs every 12 (twelve) hours.    Yes [provider]  furosemide (LASIX) 20 MG tablet Take 20 mg by mouth every morning.  06/12/17  Yes [provider]  guaiFENesin (MUCINEX) 600 MG 12 hr tablet Take 600 mg by mouth 2 (two) times daily.   Yes [provider]  Ipratropium-Albuterol (COMBIVENT RESPIMAT) 20-100 MCG/ACT AERS respimat Inhale 2 puffs into the lungs every 6 (six) hours as needed. For shortness of breath   Yes [provider]  ipratropium-albuterol (DUONEB) 0.5-2.5 (3) MG/3ML SOLN Take 3 mLs by nebulization every 3 (three) hours.   Yes [provider]  Melatonin 3 MG TABS Take 6 mg by mouth at bedtime.   Yes [provider]  metoprolol tartrate (LOPRESSOR) 25 MG tablet Take 12.5 mg by mouth 2 (two) times daily.    Yes [provider]  omeprazole (PRILOSEC) 20 MG capsule Take 20 mg by mouth every morning.   Yes [provider]  pravastatin (PRAVACHOL) 40 MG tablet Take 40 mg by mouth every evening.    Yes [provider]  predniSONE (DELTASONE) 10 MG tablet Take 10 mg by mouth daily with breakfast. 07/02/17  Yes [provider]  Probiotic Product (PROBIOTIC-10 PO) Take 1 capsule by mouth every morning.   Yes [provider]  sertraline (ZOLOFT) 100 MG tablet Take 200 mg by mouth daily.  06/23/12  Yes [provider]  SPIRIVA HANDIHALER 18 MCG inhalation capsule Place 1 capsule into inhaler and inhale every morning.  05/29/17  Yes [provider]  traMADol (ULTRAM) 50 MG tablet Take 1 tablet (50 mg total) by mouth every 6 (six) hours as needed. For pain Patient taking differently: Take 50-150 mg by mouth See admin instructions. 50mg  scheduled every morning, may have two additional 50mg  doses as needed for pain 11/27/11  Yes Rhyne, Ames Coupe, PA-C    Inpatient Medications: Scheduled Meds: . aspirin EC  81 mg Oral Daily  . carvedilol  3.125 mg Oral BID WC  . clopidogrel  75 mg Oral Daily  . furosemide  40 mg Intravenous BID  . ipratropium-albuterol  3 mL Nebulization Q4H  . mouth rinse  15 mL Mouth Rinse BID  . mometasone-formoterol  2 puff Inhalation BID  . sertraline  100 mg Oral Daily  . spironolactone  100 mg Oral Daily   Continuous Infusions:  PRN Meds: albuterol, ALPRAZolam, bisacodyl, HYDROcodone-acetaminophen, morphine injection, ondansetron **OR** ondansetron (ZOFRAN) IV, senna-docusate  Allergies:    Allergies  Allergen Reactions  . Codeine Nausea And Vomiting    Upset stomach     Social History:   Social History   Socioeconomic History  . Marital status: Married    Spouse name: Not on file  . Number of children: Not on file  . Years of education: Not on file  . Highest education level: Not on file  Occupational History  . Not on file  Social Needs  . Financial resource strain: Not on file  . Food insecurity:    Worry: Not on file     Inability: Not on file  . Transportation needs:    Medical: Not on file    Non-medical: Not on file  Tobacco Use  . Smoking status: Former Smoker    Packs/day: 0.25    Years: 40.00    Pack years: 10.00    Types: E-cigarettes, Cigarettes  . Smokeless tobacco: Never Used  . Tobacco comment: QUIT IN 2013  Substance and Sexual  Activity  . Alcohol use: Yes    Comment: OCCASIONAL  . Drug use: No  . Sexual activity: Not on file  Lifestyle  . Physical activity:    Days per week: Not on file    Minutes per session: Not on file  . Stress: Not on file  Relationships  . Social connections:    Talks on phone: Not on file    Gets together: Not on file    Attends religious service: Not on file    Active member of club or organization: Not on file    Attends meetings of clubs or organizations: Not on file    Relationship status: Not on file  . Intimate partner violence:    Fear of current or ex partner: Not on file    Emotionally abused: Not on file    Physically abused: Not on file    Forced sexual activity: Not on file  Other Topics Concern  . Not on file  Social History Narrative  . Not on file    Family History:    Family History  Problem Relation Age of Onset  . Emphysema Mother   . Hypertension Mother   . Emphysema Father   . Hypertension Father      ROS:  Please see the history of present illness.   All other ROS reviewed and negative.     Physical Exam/Data:   Vitals:   07/04/17 1110 07/04/17 1205 07/04/17 1212 07/04/17 1314  BP:   123/89 100/69  Pulse: (!) 143 (!) 134 (!) 145 (!) 112  Resp: 18  19   Temp:   97.7 F (36.5 C)   TempSrc:   Oral   SpO2: 96% 94% 94%   Weight:      Height:        Intake/Output Summary (Last 24 hours) at 07/04/2017 1430 Last data filed at 07/04/2017 1220 Gross per 24 hour  Intake 120 ml  Output 3800 ml  Net -3680 ml   Filed Weights   07/02/17 0257 07/03/17 0317 07/04/17 0338  Weight: 150 lb 4.8 oz (68.2 kg) 150 lb 5.7 oz  (68.2 kg) 138 lb 0.1 oz (62.6 kg)   Body mass index is 18.21 kg/m.  General: Thin, frail male, in no acute distress HEENT: normal Lymph: no adenopathy Neck: no JVD Endocrine:  No thryomegaly Vascular: Unable to appreciate carotid bruits; pedal pulses present Cardiac:  normal S1, S2; irregularly irregular rhythm; no murmur Lungs:  clear to auscultation bilaterally, but decreased air movement, no wheezing, rhonchi or rales  Abd: soft, nontender Ext: no edema Musculoskeletal:  No deformities, BUE and BLE strength normal and equal Skin: warm and dry  Neuro:  CNs 2-12 intact, no focal abnormalities noted Psych:  Normal affect   EKG:  The EKG was personally reviewed and demonstrates:  Atrial fibrillation with a competing junctional pacemaker, 79 bpm, RAD, incomplete RBBB, RV hypertrophy with repolarization abnormality Telemetry:  Telemetry was personally reviewed and demonstrates: Alternating sinus rhythm with PACs and atrial fibrillation, rates in the 90s-100s  Relevant CV Studies:  Echocardiogram 07/03/2017 Study Conclusions - Left ventricle: The cavity size was normal. Systolic function was   moderately reduced. The estimated ejection fraction was in the   range of 35% to 40%. Wall motion was normal; there were no   regional wall motion abnormalities. Doppler parameters are   consistent with abnormal left ventricular relaxation (grade 1   diastolic dysfunction). - Ventricular septum: Septal motion showed abnormal function and  dyssynchrony consistent with bundle branch block. The contour   showed diastolic flattening and systolic flattening consistent   with right ventricular pressure and volume overload. - Aortic valve: Transvalvular velocity was within the normal range.   There was no stenosis. There was no regurgitation. - Mitral valve: Transvalvular velocity was within the normal range.   There was no evidence for stenosis. There was mild regurgitation. - Right ventricle: The  cavity size was severely dilated. Wall   thickness was normal. Systolic function was moderately reduced. - Right atrium: The atrium was mildly dilated. - Tricuspid valve: There was moderate regurgitation. - Pulmonary arteries: Systolic pressure was moderately increased.   PA peak pressure: 53 mm Hg (S).  Carotid Dopplers 06/25/2013 -Right internal carotid artery stenosis and less than 40% range -Right external carotid artery stenosis present -Left internal carotid artery stenosis 40-59% range -Left vertebral artery and retrograde with brachial pressure gradient present and monophasic left subclavian artery suggestive of subclavian steal syndrome  Laboratory Data:  Chemistry Recent Labs  Lab 07/02/17 0415 07/03/17 0414 07/04/17 0401  NA 141 141 143  K 4.0 3.0* 3.6  CL 100 99 99  CO2 28 32 36*  GLUCOSE 129* 94 85  BUN 44* 44* 30*  CREATININE 1.92* 1.51* 1.16  CALCIUM 8.7* 8.3* 8.1*  GFRNONAA 35* 47* >60  GFRAA 41* 55* >60  ANIONGAP 13 10 8     Recent Labs  Lab 07/02/17 0415 07/03/17 0414  PROT 6.0* 5.9*  ALBUMIN 3.0* 2.8*  AST 1,093* 517*  ALT 637* 546*  ALKPHOS 165* 147*  BILITOT 3.0* 2.4*   Hematology Recent Labs  Lab 07/02/17 0415 07/03/17 0414 07/04/17 0401  WBC 9.0 12.4* 9.0  RBC 4.73 4.49 4.29  HGB 10.9* 10.3* 9.9*  HCT 37.4* 35.4* 34.3*  MCV 79.1 78.8 80.0  MCH 23.0* 22.9* 23.1*  MCHC 29.1* 29.1* 28.9*  RDW 18.9* 18.7* 18.6*  PLT 107* 105* 84*   Cardiac Enzymes Recent Labs  Lab 07/02/17 0415 07/02/17 0843 07/02/17 1403  TROPONINI 0.17* 0.16* 0.16*   No results for input(s): TROPIPOC in the last 168 hours.  BNP Recent Labs  Lab 07/02/17 0415  BNP 2,731.4*    DDimer No results for input(s): DDIMER in the last 168 hours.  Radiology/Studies:  Dg Chest Port 1 View  Result Date: 07/02/2017 CLINICAL DATA:  67 year old male with shortness of breath.  COPD. EXAM: PORTABLE CHEST 1 VIEW COMPARISON:  Chest radiograph dated 07/01/2017 FINDINGS:  There is emphysematous changes of the lungs. There is an area of pleural thickening and nodularity in the left upper lobe likely related to underlying scarring. This is similar to studies dating back to 2016. Right costophrenic angle density may represent scarring or trace pleural effusion. No definite pneumothorax. Apparent linear lucency along the peripheral right lung likely related to skin fold artifacts as lung parenchyma appear to extend beyond this line. If there is clinical concern for pneumothorax further evaluation with CT may provide better evaluation. Stable cardiac silhouette. There is traction of the upper mediastinum to the left secondary to scarring. No acute osseous pathology. IMPRESSION: 1. Severe emphysema with areas of subpleural scarring. 2. Right lung lucency most likely artifactual. CT may provide better evaluation if clinically indicated. No interval change since the earlier radiograph. Electronically Signed   By: Elgie CollardArash  Radparvar M.D.   On: 07/02/2017 04:20   Ir Paracentesis  Result Date: 07/03/2017 INDICATION: Cirrhosis by imaging, CHF, acute renal failure, ascites; request received for diagnostic and therapeutic paracentesis. EXAM:  ULTRASOUND GUIDED DIAGNOSTIC AND THERAPEUTIC PARACENTESIS MEDICATIONS: None COMPLICATIONS: None immediate. PROCEDURE: Informed written consent was obtained from the patient after a discussion of the risks, benefits and alternatives to treatment. A timeout was performed prior to the initiation of the procedure. Initial ultrasound scanning demonstrates a moderate amount of ascites within the right mid to lower abdominal quadrant. The right mid to lower abdomen was prepped and draped in the usual sterile fashion. 1% lidocaine with epinephrine was used for local anesthesia. Following this, a 19 gauge, 7-cm, Yueh catheter was introduced. An ultrasound image was saved for documentation purposes. The paracentesis was performed. The catheter was removed and a  dressing was applied. The patient tolerated the procedure well without immediate post procedural complication. FINDINGS: A total of approximately 2.5 liters of yellow fluid was removed. Samples were sent to the laboratory as requested by the clinical team. IMPRESSION: Successful ultrasound-guided diagnostic and therapeutic paracentesis yielding 2.5 liters of peritoneal fluid. Read by: Jeananne Rama, PA-C Electronically Signed   By: Gilmer Mor D.O.   On: 07/03/2017 12:34    Assessment and Plan:   Acute systolic heart failure -BNP 2731.4 on admission -Troponins elevated in flat pattern consistent with demand ischemia related to CHF and afib.  -Echocardiogram done yesterday showed moderately reduced LV systolic function with EF 35-40%, no regional wall motion abnormalities, grade 1 diastolic dysfunction, septal wall motion with abnormal function and dyssynchrony consistent with bundle branch block and right ventricular pressure and volume overload.  There was also mild mitral regurgitation and severely dilated right ventricle, PA peak pressure 53 mmHg.  No prior echoes found in epic-patient reports echo done approximately 2 weeks ago in [place] showed EF 45-50%. -Home medications include Lasix 20 mg (added in June for lower extremity edema) daily Lopressor 12.5 mg twice daily.  Lisinopril was stopped in March due to hypotension. -Currently carvedilol 3.125 mg twice daily has been initiated.  He is being diuresed with Lasix 40 mg IV twice daily and also spironolactone 100 mg daily has been added. Would not start ACE-I in setting of RAS and prior hypotension. -His weight is down 12 pounds since admission.  He has had 3 L of urine output in the last 24 hours and also 2.5 L of fluid aspirated from the abdomen.  He is net -5.3L fluid balance since arrival at [facility]. -Breathing has improved and his lower extremity edema is resolved.  Has abdominal distention is also greatly improved per patient  report. -currently has no further extra volume from a cardiac standpoint. May be able to reduce diuretics. -Pt not candidate for cardiac cath at this time.  -continue current therapy. -Advise palliative consult. He already has follow up scheduled with Dr. Dulce Sellar.   CAD -Status post MI x2 and DES x2.  Has been managed with aspirin 81 mg, Plavix 75 mg daily, beta-blocker, statin  Atrial fibrillation -Atrial fibrillation on EKG.  Telemetry shows sinus rhythm with PACs alternating with atrial fibrillation. -No previous known atrial fibrillation, however the patient thinks that he was on Coumadin around the time of his heart attacks in 2004/2005.  He reports no mention of irregular heartbeat at his PCP office visit approximately 2 weeks ago. -CHA2DS2/VAS Stroke Risk Score is 3 (CHF, Vasc Dz, HTN). Anticoagulation indicated for stroke risk reduction, however, would no start at this time due to acute liver failure and coagulopathy.  -Continue betablocker for rate control and monitor rhythm. Currently rate controlled in the 90's-low 100's.  Hypertension -Home medications include Lasix 20 mg daily Lopressor 12.5 mg twice daily -Currently on low dose carvedilol 3.125 mg bid, lasix IV and spironolactone 100 mg daily. BP is well controlled.  -The patient reports that he had been on lisinopril in the past but this was discontinued in March due to hypotension  CKD -CKD stage II noted in prior chart notes from [facility]. -Wife reports that the patient has only one functioning kidney -Serum creatinine was 1.92 on admission and has trended down, 1.16 today  Acute liver failure with ascites -Remote heavy alcohol use, now just occasional.  -Liver function tests elevated on admission, trending down. ? Probable cirrhosis  -Paracentesis per IR on 6/26-2.5 L of fluid aspirated and sent for evaluation -Patient had reportedly been taking quite a bit of Tylenol for pain, now on hold. -Has coagulopathy.  His INR has been 1.84, 1.60.  Hemoglobin is 9.9 today. -GI is following.  COPD -Very severe, followed at [facility], last seen in 2017 -With chronic respiratory failure, on home oxygen therapy  Peripheral vascular disease -The patient is status post right external iliac artery patch angioplasty in 11/2011, right to left fem-fem bypass grafting in 2004, right renal artery stent in 2004, known right SFA occlusion per angiogram in 2013.   Carotid artery stenoses  -Carotid Dopplers done in 06/2013 showed less than 40% stenosis of the R ICA and 40-59% stenosis of the LICA along with findings suggestive of subclavian steal syndrome.  Left subclavian artery occlusion per CT at outside hospital in 11/2014.  The patient has been followed by vein and vascular surgery but has not been seen since 2015.  Follow-up carotid Dopplers were ordered but were not completed.  He is treated with aspirin 81 mg, Plavix 75 mg and statin. -CT of the neck at [facility] in 12/2014 showed chunky atherosclerotic calcifications at the carotid bifurcations bilaterally -He will need follow up with [facility] as on outpatient.   Renal artery stenosis -Status post renal artery stent in 2004  Hyperlipidemia -The patient has been treated with pravastatin 40 mg daily, currently on hold due to acute liver failure  For questions or updates, please contact [facility] Please consult [email or web address] for contact info under Cardiology/STEMI.   Signed, Berton Bon, NP  07/04/2017 2:30 PM   Patient examined chart reviewed. Discussed care with patient , wife and PA. Multiple co morbidities including severe COPD on home oxygen, cirrhosis with liver failure and paracentesis yesterday. Has lost 60 lbs over the last two years and has CRF with ACE stopped earlier In year for hypotension. Significant carotid and peripheral vascular disease. PAF not on anticoagulation due to synthetic liver  dysfunction. Denies dyspnea , chest pain palpitations. Wife indicates he has done nothing but sit in chair since December. Exam remarkable for jaundice Cachectic chronically ill white male. Bilateral carotid bruits worse on right with subclavian extension. PMI enlarged SEM abdomen benign post paracentesis. Post Fem/Fem bypass with palpable pedal pulses. Telemetry with SR PaCls now but has had PAF. I think his ascites Is primarily from severe COPD and primary cirrhosis and not cardiac. Agree with combination of lasix and aldactone Latter dose is high but he required manual paracentesis so may be appropriate. Rx heart with coreg and diuretics BP too soft and renal issues with previous Bilateral stents preclude resumption of ACE/ARB or entresto. He is not a candidate for right and left cath and his morbidity and mortality would appear to be  more imminently related to his COPD and Cirrhosis Would agree with palliative care consult. He has a f/u appointment  With Dr [facility] in [place] in middle of July already   Charlton Haws

## 2017-07-04 NOTE — Evaluation (Signed)
Clinical/Bedside Swallow Evaluation Patient Details  Name: Shaun Schmitt MRN: [id number] Date of Birth: 06/08/1950  Today's Date: 07/04/2017 Time: SLP Start Time (ACUTE ONLY): 0920 SLP Stop Time (ACUTE ONLY): 0936 SLP Time Calculation (min) (ACUTE ONLY): 16 min  Past Medical History:  Past Medical History:  Diagnosis Date  . 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 Arthritis    Gout  . Bronchitis April 23, 2012  . CAD (coronary artery disease) 12/28/2014   Stents x2 on ASA and Plavix  . Chronic respiratory failure with hypoxia (HCC) 02/16/2015  . COPD, very severe (HCC) 12/28/2014  . Elevated LFTs 06/2017  . History of tobacco abuse 02/16/2015  . Hypertension   . Occlusion and stenosis of carotid artery without mention of cerebral infarction 06/25/2013  . Pain in both knees    Right worse , bilateral Hips and ankles, numbness   . Pancytopenia (HCC) 12/28/2014  . Peripheral edema 06/26/2017  . Protein-calorie malnutrition, mild (HCC) 12/28/2014  . Pulmonary nodule/lesion, solitary 12/28/2014   RUL 8 mm nodule on OSH CT chest 11/27/14  . PVD (peripheral vascular disease) (HCC) 09/25/2011  . Renal artery stenosis (HCC) 12/07/2002   renal artery stenting 2004 and found right renal artery stent occlusion on angiography in 2013.    Marland Kitchen Rhinovirus infection 12/30/2014   Past Surgical History:  Past Surgical History:  Procedure Laterality Date  . ABDOMINAL AORTAGRAM N/A 10/31/2011   Procedure: ABDOMINAL AORTAGRAM;  Surgeon: Larina Earthly, MD;  Location: [facility] CATH LAB;  Service: Cardiovascular;  Laterality: N/A;  . CARDIAC CATHETERIZATION     cath 05/21/00 ([facility]) showed 50% ostial OM, 25-30% mRCA, EF 40-45%)  . IR PARACENTESIS  07/03/2017  . JOINT REPLACEMENT  2005   Right Hip  . KNEE SURGERY    . PATCH ANGIOPLASTY  11/26/2011   Procedure: PATCH ANGIOPLASTY;  Surgeon: Larina Earthly, MD;  Location: [facility] OR;  Service:  Vascular;  Laterality: Right;  Using Hemashield 0.8cm x 7.6 cm vascular patch.  . PR VEIN BYPASS GRAFT,AORTO-FEM-POP    . REPAIR ILIAC ARTERY  11/26/2011   Procedure: REPAIR ILIAC ARTERY;  Surgeon: Larina Earthly, MD;  Location: [facility] OR;  Service: Vascular;  Laterality: Right;  With patch angioplasty.   HPI:  Pt is a 67 y.o. male who presented to [facility] with SOB x1 week, found to have acute liver failure with ascites. Pt was noted to cough after drinking liquids, therefore he was made NPO and swallow evaluation was ordered. PMH: COPD on home oxygen 3.5 L, Emphysema, Tobacco abuse, CHF, HTN, CAD S/P stents x 2, MI x2, renal artery stenosis, S/P RIGHT renal artery stent, EtOH abuse. Pt had previous swallow testing at [facility] in 2016 after intubation, advanced to Dys 3 diet and nectar thick liquids with a chin tuck. ENT assessed for possible vocal cord injection due to concern for paralysis but they did not feel this was necessary on an inpatient basis.   Assessment / Plan / Recommendation Clinical Impression  Pt has immediate and delayed coughing associated with thin liquids. Although intermittent baseline cough complicates bedside assessment, coughing does not appear to be associated with solids or ice chips. Per chart pt has a h/o post-extubation dysphagia in 2016 although with concern for vocal cord paralysis at that time - unsure if pt followed up with ENT as recommended as he is not a clear historian. Although his vocal quality sounds fairly clear,  his cough is weak. Recommend pursuing MBS to better assess oropharyngeal function. Recommend meds whole in puree, ice chips after oral care until that time. SLP Visit Diagnosis: Dysphagia, unspecified (R13.10)    Aspiration Risk  Moderate aspiration risk    Diet Recommendation NPO except meds;Ice chips PRN after oral care   Medication Administration: Whole meds with puree    Other  Recommendations Oral Care Recommendations: Oral care QID    Follow up Recommendations        Frequency and Duration            Prognosis Prognosis for Safe Diet Advancement: Good      Swallow Study   General HPI: Pt is a 67 y.o. male who [facility] with SOB x1 week, found to have acute liver failure with ascites. Pt was noted to cough after drinking liquids, therefore he was made NPO and swallow evaluation was ordered. PMH: COPD on home oxygen 3.5 L, Emphysema, Tobacco abuse, CHF, HTN, CAD S/P stents x 2, MI x2, renal artery stenosis, S/P RIGHT renal artery stent, EtOH abuse. Pt had previous swallow testing at [facility] in 2016 after intubation, advanced to Dys 3 diet and nectar thick liquids with a chin tuck. ENT assessed for possible vocal cord injection due to concern for paralysis but they did not feel this was necessary on an inpatient basis. Type of Study: Bedside Swallow Evaluation Previous Swallow Assessment: see HPI Diet Prior to this Study: NPO Temperature Spikes Noted: No Respiratory Status: Nasal cannula History of Recent Intubation: No Behavior/Cognition: Alert;Cooperative;Requires cueing Oral Cavity Assessment: Within Functional Limits Oral Care Completed by SLP: No Oral Cavity - Dentition: Missing dentition Vision: Functional for self-feeding Self-Feeding Abilities: Able to feed self Patient Positioning: Upright in bed Baseline Vocal Quality: Normal Volitional Cough: Weak Volitional Swallow: Able to elicit    Oral/Motor/Sensory Function Overall Oral Motor/Sensory Function: Within functional limits   Ice Chips Ice chips: Within functional limits Presentation: Cup;Self Fed   Thin Liquid Thin Liquid: Impaired Presentation: Cup;Self Fed;Straw Pharyngeal  Phase Impairments: Cough - Immediate;Cough - Delayed    Nectar Thick Nectar Thick Liquid: Not tested   Honey Thick Honey Thick Liquid: Not tested   Puree Puree: Within functional limits Presentation: Self Fed;Spoon   Solid   GO   Solid: Within  functional limits Presentation: Self Georjean ModeFed        Paiewonsky, Aryanah Enslow 07/04/2017,9:51 AM   Maxcine HamLaura Paiewonsky, M.A. CCC-SLP [phone]

## 2017-07-04 NOTE — Evaluation (Signed)
Physical Therapy Evaluation Patient Details Name: Shaun Schmitt MRN: [id number] DOB: 03-Apr-1950 Today's Date: 07/04/2017   History of Present Illness  67 yo admitted with malaise, fatigue, SOB with acute liver failure with CHF s/p paracentesis. PMHx: ureteral stenosis; PVD; pancytopenia; carotid stenosis; CAD; HTN; and severe COPD on 3.5L home O2   Clinical Impression  Pt pleasant with flat affect, decreased stability and safety with generalized weakness who sleeps in recliner at home, spends most of his day sitting but states he cares for himself and has been to a SNF in the past. Pt with desaturation into the 70s with only 6' of gait on 6L with increased time to recover. Pt with above and below deficits who will benefit from acute therapy to maximize mobility, function and gait to decrease burden of care.   HR 106-134 SpO2 86-90% on RA with standing, 92% at rest on arrival    Follow Up Recommendations SNF;Supervision/Assistance - 24 hour;Home health PT(pt reports he refuses SNF)    Equipment Recommendations  None recommended by PT    Recommendations for Other Services OT consult     Precautions / Restrictions Precautions Precautions: Fall Precaution Comments: watch sats      Mobility  Bed Mobility Overal bed mobility: Needs Assistance Bed Mobility: Supine to Sit     Supine to sit: Min guard     General bed mobility comments: cues for seqeunce and initiation with increased time  Transfers Overall transfer level: Needs assistance   Transfers: Sit to/from Stand Sit to Stand: Min guard         General transfer comment: cues for hand placement from bed x 2 trials  Ambulation/Gait Ambulation/Gait assistance: Mod assist Gait Distance (Feet): 6 Feet Assistive device: Rolling walker (2 wheeled) Gait Pattern/deviations: Scissoring;Narrow base of support   Gait velocity interpretation: <1.8 ft/sec, indicate of risk for recurrent falls General Gait Details: pt  pushing RW too anterior, LLE internal rotation with pt leaning right, mod assist for stability and balance with cues to increased BOS and for RW use. SpO2 dropped to 76% on 6L with limited gait  Stairs            Wheelchair Mobility    Modified Rankin (Stroke Patients Only)       Balance Overall balance assessment: Needs assistance   Sitting balance-Leahy Scale: Fair       Standing balance-Leahy Scale: Poor Standing balance comment: reliant on bil UE support                             Pertinent Vitals/Pain Pain Assessment: 0-10 Pain Score: 5  Pain Location: everywhere Pain Descriptors / Indicators: Aching Pain Intervention(s): Limited activity within patient's tolerance;Repositioned    Home Living Family/patient expects to be discharged to:: Private residence Living Arrangements: Spouse/significant other Available Help at Discharge: Family;Available 24 hours/day Type of Home: House Home Access: Stairs to enter   Enter[employer] CorporationEntrance Stairs-Number of Steps: 2 Home Layout: One level Home Equipment: Walker - 2 wheels;Cane - single point;Bedside commode;Shower seat;Wheelchair - manual      Prior Function Level of Independence: Independent with assistive device(s);Needs assistance   Gait / Transfers Assistance Needed: pt states he walks room to room in house with RW  ADL's / Homemaking Assistance Needed: sits to bathe and dress, wife performs homemaking        Hand Dominance        Extremity/Trunk Assessment   Upper Extremity Assessment Upper  Extremity Assessment: Generalized weakness    Lower Extremity Assessment Lower Extremity Assessment: Generalized weakness    Cervical / Trunk Assessment Cervical / Trunk Assessment: Kyphotic  Communication   Communication: No difficulties  Cognition Arousal/Alertness: Awake/alert Behavior During Therapy: WFL for tasks assessed/performed Overall Cognitive Status: Impaired/Different from baseline Area of  Impairment: Memory;Safety/judgement                     Memory: Decreased short-term memory   Safety/Judgement: Decreased awareness of safety;Decreased awareness of deficits     General Comments: pt with decreased recall of home environment and provided varying answers. Pt with decreased mobility and states he always bounces back and lacks awareness of his deficits      General Comments      Exercises     Assessment/Plan    PT Assessment Patient needs continued PT services  PT Problem List Decreased strength;Decreased mobility;Decreased safety awareness;Decreased activity tolerance;Decreased balance;Decreased knowledge of use of DME;Cardiopulmonary status limiting activity;Decreased cognition       PT Treatment Interventions Gait training;Therapeutic exercise;Patient/family education;DME instruction;Therapeutic activities;Cognitive remediation;Stair training;Balance training;Functional mobility training;Neuromuscular re-education    PT Goals (Current goals can be found in the Care Plan section)  Acute Rehab PT Goals Patient Stated Goal: go home PT Goal Formulation: With patient Time For Goal Achievement: 07/18/17 Potential to Achieve Goals: Fair    Frequency Min 3X/week   Barriers to discharge        Co-evaluation               AM-PAC PT "6 Clicks" Daily Activity  Outcome Measure Difficulty turning over in bed (including adjusting bedclothes, sheets and blankets)?: A Little Difficulty moving from lying on back to sitting on the side of the bed? : A Little Difficulty sitting down on and standing up from a chair with arms (e.g., wheelchair, bedside commode, etc,.)?: A Little Help needed moving to and from a bed to chair (including a wheelchair)?: A Little Help needed walking in hospital room?: A Lot Help needed climbing 3-5 steps with a railing? : A Lot 6 Click Score: 16    End of Session Equipment Utilized During Treatment: Gait belt;Oxygen Activity  Tolerance: Patient limited by fatigue Patient left: in chair;with call bell/phone within reach;with chair alarm set;with nursing/sitter in room Nurse Communication: Mobility status PT Visit Diagnosis: Other abnormalities of gait and mobility (R26.89);Muscle weakness (generalized) (M62.81);Unsteadiness on feet (R26.81)    Time: 1610-9604 PT Time Calculation (min) (ACUTE ONLY): 34 min   Charges:   PT Evaluation $PT Eval Moderate Complexity: 1 Mod PT Treatments $Therapeutic Activity: 8-22 mins   PT G Codes:        Delaney Meigs, PT [phone]   Lianni Kanaan B Jaquanda Wickersham 07/04/2017, 12:11 PM

## 2017-07-04 NOTE — Plan of Care (Signed)
Speech has evaluated pt; said meds to be given with applesauce and can have ice chips only until after barium swallow; pt reports no pain; anxiety controlled with meds; vitals within baseline; HR remains sinus arrhythmia; will continue to monitor.   [place]  Leyla Soliz, RN

## 2017-07-04 NOTE — Progress Notes (Addendum)
Daily Rounding Note  07/04/2017, 8:17 AM  LOS: 2 days   SUBJECTIVE:   Complaining of pain in his back and his feet.  Denies abdominal pain.  No nausea.  Also thirsty as he is n.p.o. Last bowel movement he thinks was the day before yesterday.  OBJECTIVE:         Vital signs in last 24 hours:    Temp:  [97.7 F (36.5 C)-98.1 F (36.7 C)] 97.7 F (36.5 C) (06/27 0735) Pulse Rate:  [50-97] 61 (06/27 0746) Resp:  [15-31] 16 (06/27 0746) BP: (95-158)/(53-110) 137/82 (06/27 0735) SpO2:  [80 %-100 %] 91 % (06/27 0746) Weight:  [138 lb 0.1 oz (62.6 kg)] 138 lb 0.1 oz (62.6 kg) (06/27 0338) Last BM Date: 07/02/17 Filed Weights   07/02/17 0257 07/03/17 0317 07/04/17 0338  Weight: 150 lb 4.8 oz (68.2 kg) 150 lb 5.7 oz (68.2 kg) 138 lb 0.1 oz (62.6 kg)   General: Cachectic, chronically ill looking, alert, comfortable. Heart: RRR.  Distant heart sounds. Chest: Reduced breath sounds, no adventitious sounds.  No cough or labored breathing at rest. Abdomen: Soft.  Not tender or distended.  Bowel sounds slightly hypoactive but normal quality. Extremities: Purpura on his arms.  No edema. Neuro/Psych: Alert.  Oriented x3.  Appropriate.  Intake/Output from previous day: 06/26 0701 - 06/27 0700 In: 240 [P.O.:240] Out: 3000 [Urine:3000]   Lab Results: Recent Labs    07/02/17 0415 07/03/17 0414 07/04/17 0401  WBC 9.0 12.4* 9.0  HGB 10.9* 10.3* 9.9*  HCT 37.4* 35.4* 34.3*  PLT 107* 105* 84*   BMET Recent Labs    07/02/17 0415 07/03/17 0414 07/04/17 0401  NA 141 141 143  K 4.0 3.0* 3.6  CL 100 99 99  CO2 28 32 36*  GLUCOSE 129* 94 85  BUN 44* 44* 30*  CREATININE 1.92* 1.51* 1.16  CALCIUM 8.7* 8.3* 8.1*   LFT Recent Labs    07/02/17 0415 07/03/17 0414  PROT 6.0* 5.9*  ALBUMIN 3.0* 2.8*  AST 1,093* 517*  ALT 637* 546*  ALKPHOS 165* 147*  BILITOT 3.0* 2.4*  BILIDIR 1.6*  --   IBILI 1.4*  --     PT/INR Recent Labs    07/03/17 0414 07/04/17 0401  LABPROT 21.1* 18.9*  INR 1.84 1.60   Hepatitis Panel Recent Labs    07/02/17 0415  HEPBSAG Negative  HCVAB <0.1  HEPAIGM Negative  HEPBIGM Negative    Studies/Results: Ir Paracentesis  Result Date: 07/03/2017 INDICATION: Cirrhosis by imaging, CHF, acute renal failure, ascites; request received for diagnostic and therapeutic paracentesis. EXAM: ULTRASOUND GUIDED DIAGNOSTIC AND THERAPEUTIC PARACENTESIS MEDICATIONS: None COMPLICATIONS: None immediate. PROCEDURE: Informed written consent was obtained from the patient after a discussion of the risks, benefits and alternatives to treatment. A timeout was performed prior to the initiation of the procedure. Initial ultrasound scanning demonstrates a moderate amount of ascites within the right mid to lower abdominal quadrant. The right mid to lower abdomen was prepped and draped in the usual sterile fashion. 1% lidocaine with epinephrine was used for local anesthesia. Following this, a 19 gauge, 7-cm, Yueh catheter was introduced. An ultrasound image was saved for documentation purposes. The paracentesis was performed. The catheter was removed and a dressing was applied. The patient tolerated the procedure well without immediate post procedural complication. FINDINGS: A total of approximately 2.5 liters of yellow fluid was removed. Samples were sent to the laboratory as requested by the clinical  team. IMPRESSION: Successful ultrasound-guided diagnostic and therapeutic paracentesis yielding 2.5 liters of peritoneal fluid. Read by: Jeananne RamaKevin Allred, PA-C Electronically Signed   By: Gilmer MorJaime  Wagner D.O.   On: 07/03/2017 12:34   Scheduled Meds: . aspirin EC  81 mg Oral Daily  . clopidogrel  75 mg Oral Daily  . furosemide  40 mg Intravenous BID  . ipratropium-albuterol  3 mL Nebulization Q4H  . mouth rinse  15 mL Mouth Rinse BID  . mometasone-formoterol  2 puff Inhalation BID  . phytonadione  5 mg Oral  Daily  . sertraline  100 mg Oral Daily  . spironolactone  100 mg Oral Daily   Continuous Infusions: PRN Meds:.albuterol, ALPRAZolam, bisacodyl, HYDROcodone-acetaminophen, morphine injection, ondansetron **OR** ondansetron (ZOFRAN) IV, senna-docusate   ASSESMENT:   *   Suspected cirrhosis of liver.  ? From remote ETOH abuse.  ? Due to R heart failure.   Viral hepatitis serologies negative. Alpha 1 AT,AMA, smooth muscle Ab, ANA,  and IgG are pending.   Marked transaminitis, suspected due to superimposed hypoperfusion (?from MI?)  *   Coagulopathy. Further improved, day 2 of 3 PO Vitamin K   *  Ascites.  S/p 2.5 liter tap 6/26.  SAAG 1.4. No SBP.  Aldactone 100 mg added, begins this morning, to Lasix 40 mg twice daily.    *   CHF.  Improved.  On Lasix and Aldactone.  *   CAD.  Hx MI and cardiac stents.  On 81 ASA Plavix (PTA as well)  ? Recurrent MI.    *   Dysphagia.  Awaiting SLP eval.  I just placed order.    *   Hypokalemia, resolved.    *   AKI.  Mostly resolved, BUN still elevated.    *  Baseline severe O2 dependent COPD/Emphysema.     PLAN   *    Cardiology consult was called.  Bedside swallow evaluation, SLP input ordered as well. I went ahead and allowed him oral meds with sips of water given that he is on a lot of oral medications that he will be able to take not allowed to swallow. Allow p.o. as recommended by SLP.    Jennye MoccasinSarah Gribbin  07/04/2017, 8:17 AM Phone [phone]     [facility] Attending   I have taken an interval history, reviewed the chart and examined the patient. I agree with the Advanced Practitioner's note, impression and recommendations.    Iva Booparl E. Brittnei Jagiello, MD, [facility] 07/04/2017 9:18 PM

## 2017-07-04 NOTE — Progress Notes (Signed)
PROGRESS NOTE    Shaun Schmitt  IRJ:[id number] DOB: 11/03/1950 DOA: 07/02/2017 PCP: Crist FatVan Eyk, Jason, MD   Brief Narrative:  67 y.o. male PMHx COPD on home oxygen 3.5 L,, Emphysema,Tobacco abuse,, CHF, HTN, CAD S/P stents x 2, MI x2, renal artery stenosis, S/P RIGHT renal artery stent, EtOH abuse,   [facility]ing to [facility] with shortness of breath for about 1 week, worse on exertion, generalized weakness, cough, without fever or chills. Denies rhinorrhea or hemoptysis. Denies fevers, chills, night sweats or mucositis.   Denies any chest pain, chest wall pain or palpitations. Denies any sick contacts or recent long distant travels. Denies any abdominal pain. Has decreased appetite but he is unaware of weight loss.  And occasional nausea without vomiting.  He denies dysuria, gross hematuria, or changes since his urine color, or changes in his stools.  Denies dizziness or vertigo. Denies lower extremity swelling.  He denies confusion.  denies any vision changes, double vision or headaches.  To be a heavy drinker, but he takes occasionally a beer.  He quit tobacco many years ago, he does smoke vape.  He denies any recreational drug use.  He does take significant amount of Tylenol for pain.  He is not very compliant with his regular medicines.  At [facility], the patient was tachypneic, and had intermittent periods of bradycardia and tachycardia, and anxious, however, on presentation [facility], the patient is reporting feeling better.     Subjective: 6/27   A/O x4, positive chronic S OB, negative CP, negative abdominal pain.  States feels significantly improved post paracentesis.     Assessment & Plan:   Principal Problem:   Acute liver failure Active Problems:   PVD (peripheral vascular disease) (HCC)   Peripheral vascular disease, unspecified (HCC)   Acute on chronic respiratory failure with hypoxia (HCC)   Chronic respiratory failure with hypoxia (HCC)   CAD (coronary  artery disease)   COPD, very severe (HCC)   History of tobacco abuse   Pressure injury of skin   Ascites   Microcytic anemia  Acute liver failure with ascites/ MELD score= 27, 19.6% 6569-month mortality -Remote Hx EtOH abuse  -6/26 IR paracentesis: 2.5 L fluid aspirated, labs pending -Hold all Tylenol products -Acute hepatitis panel negative -GI following - Awaiting echocardiogram  - Trend ammonia Recent Labs  Lab 07/02/17 1403 07/04/17 0401  AMMONIA 30 12    Coagulopathy -Secondary to liver failure Recent Labs  Lab 07/02/17 0415 07/03/17 0414 07/04/17 0401  INR 2.41 1.84 1.60  -Improving with vitamin K vitamin K 5 mg x 3 doses first dose 6/25  COPD - On 3.5 L O2 at home.  Currently on 4 L O2 via [place] with SPO2 91% - CXR positive for emphysema see results below - RIGHT lung lucency?  Artifact vs lung nodule once patient more stable obtain chest CT -Duoneb q 4hr -Dulera 100-5 mcg 2 puff BID     Infrarenal 3.2 cm abdominal aortic aneurysm. No bleeding issues -Will need to follow with Vascular surgery as outpatient with repeat CT in 6 months     Acute systolic and diastolic CHF (baseline weight unknown) -Strict in and out since admission negative - 4.1 L -Daily weight Filed Weights   07/02/17 0257 07/03/17 0317 07/04/17 0338  Weight: 150 lb 4.8 oz (68.2 kg) 150 lb 5.7 oz (68.2 kg) 138 lb 0.1 oz (62.6 kg)  -Transfuse for hemoglobin<8 -Echocardiogram consistent with systolic and diastolic CHF see results below.  Does not appear that patient has seen cardiologist since. -Consult cardiology - 6/27 Coreg 3.125 mg BID -Lasix 40 mg BID -Spironolactone 100 mg daily: We will continue for now, await cardiology recommendations.  Would not be my first choice.  Pulmonary HTN -See CHF   Essential HTN -See CHF  CAD S/P MI 2012  - EKG sinus rhythm, with occasional PVCs, incomplete right bundle branch block, right ventricular hypertrophy, QTC 459. Tn 0.14  .Denies CP Recent  Labs  Lab 07/02/17 0415 07/02/17 0843 07/02/17 1403  TROPONINI 0.17* 0.16* 0.16*  - Troponins remain elevated but stable.  Patient asymptomatic. -Plavix 75 mg daily  -Aspirin on hold secondary acute liver failure   -Patient already with coagulopathy secondary to liver failure will not start on heparin.    Acute kidney injury -Most likely secondary to dehydration -Monitor closely Recent Labs  Lab 07/02/17 0415 07/03/17 0414 07/04/17 0401  CREATININE 1.92* 1.51* 1.16  -resolved  HLD -  Lipid panel pending   Depression - Zoloft 100 mg daily   Hypokalemia -Resolved  Dysphasia - RN staff witnessed what appeared to be several episodes of aspiration.  Patient now n.p.o. - Awaiting swallow evaluation.    Goals of care -6/27 PALLIATIVE CARE: Acute liver failure, COPD, acute on chronic diastolic CHF evaluate for change of CODE STATUS to DNR, short-term  Vs long-term goals of care    DVT prophylaxis: SCD Code Status: Full Family Communication: None Disposition Plan: TBD   Consultants:       Procedures/Significant Events:  CT Abdomen and Pelvis w/o contrast at outside hospital:-Remarkable for  and morphologic changes of cirrhosis with moderate to large volume of ascites.  Ultrasound of the abdomen outside hospital:-moderate ascites, predominantly perihepatic, mildly echogenic liver parenchyma, likely due to steatosis and or fibrosis, no definite liver surface irregularity or masses. 6/25 PCXR:Severe emphysema with areas of subpleural scarring. 2. Right lung lucency most likely artifactual. CT may provide better evaluation if clinically indicated. No interval change since the earlier radiograph. 6/26 IR paracentesis: 2.5 L of yellow fluid aspirated 6/26 echocardiogram:- Left ventricle: LVEF = 35% to 40%.-Grade 1 diastolic dysfunction. - Ventricular septum: Septal motion showed abnormal function and  dyssynchrony consistent with bundle branch block.  -Right  ventricle:  severely dilated.-Tricuspid valve: moderate regurgitation. - Pulmonary arteries:  PA peak pressure: 53 mm Hg (S).    I have personally reviewed and interpreted all radiology studies and my findings are as above.  VENTILATOR SETTINGS:    Cultures 6/25 acute hepatitis panel negative 6/25 HIV negative     Antimicrobials: Anti-infectives (From admission, onward)   None      Devices    LINES / TUBES:      Continuous Infusions:   Objective: Vitals:   07/04/17 0338 07/04/17 0359 07/04/17 0735 07/04/17 0746  BP: 140/75 140/75 137/82   Pulse: 71 97 60 61  Resp: 15  17 16   Temp: 97.7 F (36.5 C)  97.7 F (36.5 C)   TempSrc: Oral  Oral   SpO2: (!) 80% 95% (!) 84% 91%  Weight: 138 lb 0.1 oz (62.6 kg)     Height:        Intake/Output Summary (Last 24 hours) at 07/04/2017 0825 Last data filed at 07/04/2017 0000 Gross per 24 hour  Intake 240 ml  Output 3000 ml  Net -2760 ml   Filed Weights   07/02/17 0257 07/03/17 0317 07/04/17 0338  Weight: 150 lb 4.8 oz (68.2 kg) 150 lb 5.7 oz (68.2 kg)  138 lb 0.1 oz (62.6 kg)    Physical Exam:  General: A/O x4, positive acute on chronic respiratory distress, cachectic Neck:  Negative scars, masses, torticollis, lymphadenopathy, JVD Lungs: Clear to auscultation bilaterally without wheezes or crackles Cardiovascular: Regular rate and rhythm without murmur gallop or rub normal S1 and S2 Abdomen: negative abdominal pain, positive distention, positive fluid wave, positive soft, bowel sounds, no rebound, no ascites, no appreciable mass Extremities: No significant cyanosis, clubbing, or edema bilateral lower extremities Skin: Negative rashes, lesions, ulcers Psychiatric:  Negative depression, negative anxiety, negative fatigue, negative mania  Central nervous system:  Cranial nerves II through XII intact, tongue/uvula midline, all extremities muscle strength 5/5, sensation intact throughout, negative dysarthria, negative  expressive aphasia, negative receptive aphasia.    .     Data Reviewed: Care during the described time interval was provided by me .  I have reviewed this patient's available data, including medical history, events of note, physical examination, and all test results as part of my evaluation.   CBC: Recent Labs  Lab 07/02/17 0415 07/03/17 0414 07/04/17 0401  WBC 9.0 12.4* 9.0  NEUTROABS 8.4*  --   --   HGB 10.9* 10.3* 9.9*  HCT 37.4* 35.4* 34.3*  MCV 79.1 78.8 80.0  PLT 107* 105* 84*   Basic Metabolic Panel: Recent Labs  Lab 07/02/17 0415 07/03/17 0414 07/04/17 0401  NA 141 141 143  K 4.0 3.0* 3.6  CL 100 99 99  CO2 28 32 36*  GLUCOSE 129* 94 85  BUN 44* 44* 30*  CREATININE 1.92* 1.51* 1.16  CALCIUM 8.7* 8.3* 8.1*  MG  --   --  1.8   GFR: Estimated Creatinine Clearance: 57 mL/min (by C-G formula based on SCr of 1.16 mg/dL). Liver Function Tests: Recent Labs  Lab 07/02/17 0415 07/03/17 0414  AST 1,093* 517*  ALT 637* 546*  ALKPHOS 165* 147*  BILITOT 3.0* 2.4*  PROT 6.0* 5.9*  ALBUMIN 3.0* 2.8*   No results for input(s): LIPASE, AMYLASE in the last 168 hours. Recent Labs  Lab 07/02/17 1403 07/04/17 0401  AMMONIA 30 12   Coagulation Profile: Recent Labs  Lab 07/02/17 0415 07/03/17 0414 07/04/17 0401  INR 2.41 1.84 1.60   Cardiac Enzymes: Recent Labs  Lab 07/02/17 0415 07/02/17 0843 07/02/17 1403  TROPONINI 0.17* 0.16* 0.16*   BNP (last 3 results) No results for input(s): PROBNP in the last 8760 hours. HbA1C: No results for input(s): HGBA1C in the last 72 hours. CBG: No results for input(s): GLUCAP in the last 168 hours. Lipid Profile: No results for input(s): CHOL, HDL, LDLCALC, TRIG, CHOLHDL, LDLDIRECT in the last 72 hours. Thyroid Function Tests: Recent Labs    07/02/17 0415  TSH 2.552   Anemia Panel: No results for input(s): VITAMINB12, FOLATE, FERRITIN, TIBC, IRON, RETICCTPCT in the last 72 hours. Urine analysis:      Component Value Date/Time   COLORURINE YELLOW 07/02/2017 1654   APPEARANCEUR CLEAR 07/02/2017 1654   LABSPEC 1.011 07/02/2017 1654   PHURINE 5.0 07/02/2017 1654   GLUCOSEU NEGATIVE 07/02/2017 1654   HGBUR SMALL (A) 07/02/2017 1654   BILIRUBINUR NEGATIVE 07/02/2017 1654   KETONESUR NEGATIVE 07/02/2017 1654   PROTEINUR NEGATIVE 07/02/2017 1654   UROBILINOGEN 0.2 11/26/2011 0805   NITRITE NEGATIVE 07/02/2017 1654   LEUKOCYTESUR NEGATIVE 07/02/2017 1654   Sepsis Labs: @LABRCNTIP (procalcitonin:4,lacticidven:4)  )No results found for this or any previous visit (from the past 240 hour(s)).       Radiology Studies: Ir Paracentesis  Result Date: 07/03/2017 INDICATION: Cirrhosis by imaging, CHF, acute renal failure, ascites; request received for diagnostic and therapeutic paracentesis. EXAM: ULTRASOUND GUIDED DIAGNOSTIC AND THERAPEUTIC PARACENTESIS MEDICATIONS: None COMPLICATIONS: None immediate. PROCEDURE: Informed written consent was obtained from the patient after a discussion of the risks, benefits and alternatives to treatment. A timeout was performed prior to the initiation of the procedure. Initial ultrasound scanning demonstrates a moderate amount of ascites within the right mid to lower abdominal quadrant. The right mid to lower abdomen was prepped and draped in the usual sterile fashion. 1% lidocaine with epinephrine was used for local anesthesia. Following this, a 19 gauge, 7-cm, Yueh catheter was introduced. An ultrasound image was saved for documentation purposes. The paracentesis was performed. The catheter was removed and a dressing was applied. The patient tolerated the procedure well without immediate post procedural complication. FINDINGS: A total of approximately 2.5 liters of yellow fluid was removed. Samples were sent to the laboratory as requested by the clinical team. IMPRESSION: Successful ultrasound-guided diagnostic and therapeutic paracentesis yielding 2.5 liters of  peritoneal fluid. Read by: Jeananne Rama, PA-C Electronically Signed   By: Gilmer Mor D.O.   On: 07/03/2017 12:34        Scheduled Meds: . aspirin EC  81 mg Oral Daily  . clopidogrel  75 mg Oral Daily  . furosemide  40 mg Intravenous BID  . ipratropium-albuterol  3 mL Nebulization Q4H  . mouth rinse  15 mL Mouth Rinse BID  . mometasone-formoterol  2 puff Inhalation BID  . phytonadione  5 mg Oral Daily  . sertraline  100 mg Oral Daily  . spironolactone  100 mg Oral Daily   Continuous Infusions:   LOS: 2 days    Time spent: 40 minutes    Shaun Schmitt, Roselind Messier, MD [facility] Pager [phone]   If 7PM-7AM, please contact night-coverage [email or web address] Password [email or web address] 07/04/2017, 8:25 AM

## 2017-07-04 NOTE — Care Management Note (Signed)
Case Management Note  Patient Details  Name: Shaun Schmitt MRN: [id number] Date of Birth: [birth year]  Subjective/Objective:      Pt admitted with liver failure              Action/Plan:  PTA independent from home with wife - wife will 24 hour supervision at discharge.  . Pt is on home oxygen 3.5 liter supplied by [employer] - pt states he has portable tank for transport home. Pt is now s/p paracentesis - CM will continue to follow for discharge needs   Expected Discharge Date:                  Expected Discharge Plan:     In-House Referral:  Clinical Social Work  Discharge planning Services  CM Consult  Post Acute Care Choice:    Choice offered to:     DME Arranged:    DME Agency:     HH Arranged:    HH Agency:     Status of Service:     If discussed at [employer] Meetings, dates discussed:    Additional Comments: Update:  [facility] aware [facility].  Pt recommended for SNF - pt now in agreement for CSW to fax pt to SNFs. Cherylann ParrClaxton, Caterine Mcmeans S, RN 07/04/2017, 3:43 PM

## 2017-07-05 ENCOUNTER — Inpatient Hospital Stay (HOSPITAL_COMMUNITY): Payer: Medicare Other

## 2017-07-05 DIAGNOSIS — Z7189 Other specified counseling: Secondary | ICD-10-CM

## 2017-07-05 DIAGNOSIS — E876 Hypokalemia: Secondary | ICD-10-CM

## 2017-07-05 DIAGNOSIS — E43 Unspecified severe protein-calorie malnutrition: Secondary | ICD-10-CM

## 2017-07-05 DIAGNOSIS — Z515 Encounter for palliative care: Secondary | ICD-10-CM

## 2017-07-05 DIAGNOSIS — K7031 Alcoholic cirrhosis of liver with ascites: Secondary | ICD-10-CM

## 2017-07-05 DIAGNOSIS — D696 Thrombocytopenia, unspecified: Secondary | ICD-10-CM

## 2017-07-05 DIAGNOSIS — I5022 Chronic systolic (congestive) heart failure: Secondary | ICD-10-CM

## 2017-07-05 DIAGNOSIS — K7200 Acute and subacute hepatic failure without coma: Principal | ICD-10-CM

## 2017-07-05 DIAGNOSIS — F411 Generalized anxiety disorder: Secondary | ICD-10-CM

## 2017-07-05 LAB — LIPASE, FLUID: Lipase-Fluid: 16 U/L

## 2017-07-05 LAB — PROTIME-INR
INR: 1.42
PROTHROMBIN TIME: 17.2 s — AB (ref 11.4–15.2)

## 2017-07-05 LAB — MAGNESIUM: MAGNESIUM: 1.9 mg/dL (ref 1.7–2.4)

## 2017-07-05 LAB — BASIC METABOLIC PANEL
Anion gap: 10 (ref 5–15)
BUN: 19 mg/dL (ref 8–23)
CHLORIDE: 96 mmol/L — AB (ref 98–111)
CO2: 39 mmol/L — AB (ref 22–32)
CREATININE: 1.01 mg/dL (ref 0.61–1.24)
Calcium: 8.5 mg/dL — ABNORMAL LOW (ref 8.9–10.3)
GFR calc Af Amer: >60 mL/min (ref >60)
GFR calc non Af Amer: >60 mL/min (ref >60)
GLUCOSE: 86 mg/dL (ref 70–99)
POTASSIUM: 2.9 mmol/L — AB (ref 3.5–5.1)
SODIUM: 145 mmol/L (ref 135–145)

## 2017-07-05 LAB — CBC
HEMATOCRIT: 34.8 % — AB (ref 39.0–52.0)
HEMOGLOBIN: 9.8 g/dL — AB (ref 13.0–17.0)
MCH: 22.6 pg — ABNORMAL LOW (ref 26.0–34.0)
MCHC: 28.2 g/dL — AB (ref 30.0–36.0)
MCV: 80.2 fL (ref 78.0–100.0)
Platelets: 74 10*3/uL — ABNORMAL LOW (ref 150–400)
RBC: 4.34 MIL/uL (ref 4.22–5.81)
RDW: 18.4 % — AB (ref 11.5–15.5)
WBC: 7.1 10*3/uL (ref 4.0–10.5)

## 2017-07-05 LAB — TOTAL BILIRUBIN, BODY FLUID: Total bilirubin, fluid: 0.7 mg/dL

## 2017-07-05 LAB — IGG: IGG (IMMUNOGLOBIN G), SERUM: 683 mg/dL — AB (ref 700–1600)

## 2017-07-05 LAB — ALPHA-1-ANTITRYPSIN: A1 ANTITRYPSIN SER: 170 mg/dL (ref 90–200)

## 2017-07-05 LAB — AMMONIA: Ammonia: 20 umol/L (ref 9–35)

## 2017-07-05 MED ORDER — FUROSEMIDE 40 MG PO TABS
40.0000 mg | ORAL_TABLET | Freq: Every day | ORAL | Status: DC
  Administered 2017-07-05 – 2017-07-06 (×2): 40 mg via ORAL
  Filled 2017-07-05 (×2): qty 1

## 2017-07-05 MED ORDER — PANTOPRAZOLE SODIUM 40 MG PO TBEC
40.0000 mg | DELAYED_RELEASE_TABLET | Freq: Every day | ORAL | Status: DC
  Administered 2017-07-06 – 2017-07-08 (×3): 40 mg via ORAL
  Filled 2017-07-05 (×3): qty 1

## 2017-07-05 MED ORDER — PRENATAL MULTIVITAMIN CH
1.0000 | ORAL_TABLET | Freq: Every day | ORAL | Status: DC
  Administered 2017-07-06 – 2017-07-08 (×3): 1 via ORAL
  Filled 2017-07-05 (×4): qty 1

## 2017-07-05 MED ORDER — POTASSIUM CHLORIDE 20 MEQ/15ML (10%) PO SOLN
40.0000 meq | Freq: Once | ORAL | Status: AC
Start: 2017-07-05 — End: 2017-07-05
  Administered 2017-07-05: 40 meq via ORAL
  Filled 2017-07-05: qty 30

## 2017-07-05 MED ORDER — PHYTONADIONE 5 MG PO TABS
5.0000 mg | ORAL_TABLET | ORAL | Status: DC
  Administered 2017-07-08: 5 mg via ORAL
  Filled 2017-07-05: qty 1

## 2017-07-05 MED ORDER — POTASSIUM CHLORIDE 10 MEQ/100ML IV SOLN
10.0000 meq | INTRAVENOUS | Status: AC
  Administered 2017-07-05: 10 meq via INTRAVENOUS
  Filled 2017-07-05 (×2): qty 100

## 2017-07-05 MED ORDER — IPRATROPIUM-ALBUTEROL 0.5-2.5 (3) MG/3ML IN SOLN
3.0000 mL | Freq: Three times a day (TID) | RESPIRATORY_TRACT | Status: DC
  Administered 2017-07-06 – 2017-07-08 (×7): 3 mL via RESPIRATORY_TRACT
  Filled 2017-07-05 (×8): qty 3

## 2017-07-05 NOTE — Care Management Important Message (Signed)
Important Message  Patient Details  Name: Shaun Schmitt MRN: [id number] Date of Birth: January 26, 1950   Medicare Important Message Given:  Yes    Oralia RudMegan P Dori Devino 07/05/2017, 2:43 PM

## 2017-07-05 NOTE — Progress Notes (Signed)
MD at bedside. MD gave verbal order to stop potassium runs. IV potassium stopped. Will continue to monitor.

## 2017-07-05 NOTE — Progress Notes (Signed)
Pt did not have BM for 72 hours. Refused laxatives at this time. Pt verbalized decrease oral intake for past few days. In his opinion that is possible reason for not having BM. Will continue to monitor.

## 2017-07-05 NOTE — Progress Notes (Signed)
Report called to receiving RN on  5 west. VSS. Transferred to 5w09 via bed with swot, rn and with personal belongings. Family at bedside. Fulton MoleHolly Analisa Sledd,RN ]

## 2017-07-05 NOTE — Progress Notes (Addendum)
Progress Note  Patient Name: Shaun Schmitt Date of Encounter: 07/05/2017  Primary Cardiologist: Charlton Haws, MD   Subjective   [name] still looks weak. He says that he is feeling a little better today. No chest pain. Breathing is still "a little off". He wants to go home to be with his wife as she is planned for knee replacement next week.   Inpatient Medications    Scheduled Meds: . aspirin EC  81 mg Oral Daily  . carvedilol  3.125 mg Oral BID WC  . clopidogrel  75 mg Oral Daily  . furosemide  40 mg Intravenous BID  . ipratropium-albuterol  3 mL Nebulization Q4H  . mouth rinse  15 mL Mouth Rinse BID  . mometasone-formoterol  2 puff Inhalation BID  . sertraline  100 mg Oral Daily  . spironolactone  100 mg Oral Daily   Continuous Infusions: . potassium chloride 10 mEq (07/05/17 0904)   PRN Meds: albuterol, ALPRAZolam, bisacodyl, HYDROcodone-acetaminophen, morphine injection, ondansetron **OR** ondansetron (ZOFRAN) IV, senna-docusate   Vital Signs    Vitals:   07/05/17 0500 07/05/17 0700 07/05/17 0757 07/05/17 0833  BP:  127/75  127/75  Pulse:  66 100 (!) 103  Resp:  17 (!) 21   Temp:  97.6 F (36.4 C)    TempSrc:  Oral    SpO2:  92% 92%   Weight: 130 lb 8.2 oz (59.2 kg)     Height:        Intake/Output Summary (Last 24 hours) at 07/05/2017 0953 Last data filed at 07/05/2017 8119 Gross per 24 hour  Intake -  Output 2650 ml  Net -2650 ml   Filed Weights   07/03/17 0317 07/04/17 0338 07/05/17 0500  Weight: 150 lb 5.7 oz (68.2 kg) 138 lb 0.1 oz (62.6 kg) 130 lb 8.2 oz (59.2 kg)    Telemetry    Sinus rhythm with frequent PAC's and shorn runs of afib vs wandering atrial pacemaker - Personally Reviewed  ECG    No new tracings - Personally Reviewed  Physical Exam   GEN: Cachectic, chronically ill appearing male. No acute distress.   Neck: No JVD Cardiac: Irregularly irregular rhythm, no murmurs, rubs, or gallops.  Respiratory: Clear to  auscultation bilaterally. GI: Soft, nontender, non-distended  MS: No edema; No deformity. Neuro:  Nonfocal  Psych: Normal affect   Labs    Chemistry Recent Labs  Lab 07/02/17 0415 07/03/17 0414 07/04/17 0401 07/05/17 0451  NA 141 141 143 145  K 4.0 3.0* 3.6 2.9*  CL 100 99 99 96*  CO2 28 32 36* 39*  GLUCOSE 129* 94 85 86  BUN 44* 44* 30* 19  CREATININE 1.92* 1.51* 1.16 1.01  CALCIUM 8.7* 8.3* 8.1* 8.5*  PROT 6.0* 5.9*  --   --   ALBUMIN 3.0* 2.8*  --   --   AST 1,093* 517*  --   --   ALT 637* 546*  --   --   ALKPHOS 165* 147*  --   --   BILITOT 3.0* 2.4*  --   --   GFRNONAA 35* 47* >60 >60  GFRAA 41* 55* >60 >60  ANIONGAP 13 10 8 10      Hematology Recent Labs  Lab 07/03/17 0414 07/04/17 0401 07/05/17 0451  WBC 12.4* 9.0 7.1  RBC 4.49 4.29 4.34  HGB 10.3* 9.9* 9.8*  HCT 35.4* 34.3* 34.8*  MCV 78.8 80.0 80.2  MCH 22.9* 23.1* 22.6*  MCHC 29.1* 28.9* 28.2*  RDW 18.7* 18.6* 18.4*  PLT 105* 84* 74*    Cardiac Enzymes Recent Labs  Lab 07/02/17 0415 07/02/17 0843 07/02/17 1403  TROPONINI 0.17* 0.16* 0.16*   No results for input(s): TROPIPOC in the last 168 hours.   BNP Recent Labs  Lab 07/02/17 0415  BNP 2,731.4*     DDimer No results for input(s): DDIMER in the last 168 hours.   Radiology    Dg Swallowing Func-speech Pathology  Result Date: 07/04/2017 Objective Swallowing Evaluation: Type of Study: MBS-Modified Barium Swallow Study  Patient Details Name: Shaun Schmitt MRN: [id number] Date of Birth: 08/02/1950 Today's Date: 07/04/2017 Time: SLP Start Time (ACUTE ONLY): 1330 -SLP Stop Time (ACUTE ONLY): 1400 SLP Time Calculation (min) (ACUTE ONLY): 30 min Past Medical History: Past Medical History: Diagnosis Date . 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 Arthritis   Gout . Bronchitis April 23, 2012 . CAD (coronary artery disease) 12/28/2014  Stents x2 on ASA and Plavix . Chronic respiratory  failure with hypoxia (HCC) 02/16/2015 . COPD, very severe (HCC) 12/28/2014 . Elevated LFTs 06/2017 . History of tobacco abuse 02/16/2015 . Hypertension  . Occlusion and stenosis of carotid artery without mention of cerebral infarction 06/25/2013 . Pain in both knees   Right worse , bilateral Hips and ankles, numbness  . Pancytopenia (HCC) 12/28/2014 . Peripheral edema 06/26/2017 . Protein-calorie malnutrition, mild (HCC) 12/28/2014 . Pulmonary nodule/lesion, solitary 12/28/2014  RUL 8 mm nodule on OSH CT chest 11/27/14 . PVD (peripheral vascular disease) (HCC) 09/25/2011 . Renal artery stenosis (HCC) 12/07/2002  renal artery stenting 2004 and found right renal artery stent occlusion on angiography in 2013.   Marland Kitchen Rhinovirus infection 12/30/2014 Past Surgical History: Past Surgical History: Procedure Laterality Date . ABDOMINAL AORTAGRAM N/A 10/31/2011  Procedure: ABDOMINAL AORTAGRAM;  Surgeon: Larina Earthly, MD;  Location: [facility] CATH LAB;  Service: Cardiovascular;  Laterality: N/A; . CARDIAC CATHETERIZATION    cath 05/21/00 ([facility]) showed 50% ostial OM, 25-30% mRCA, EF 40-45%) . IR PARACENTESIS  07/03/2017 . JOINT REPLACEMENT  2005  Right Hip . KNEE SURGERY   . PATCH ANGIOPLASTY  11/26/2011  Procedure: PATCH ANGIOPLASTY;  Surgeon: Larina Earthly, MD;  Location: [facility] OR;  Service: Vascular;  Laterality: Right;  Using Hemashield 0.8cm x 7.6 cm vascular patch. . PR VEIN BYPASS GRAFT,AORTO-FEM-POP   . REPAIR ILIAC ARTERY  11/26/2011  Procedure: REPAIR ILIAC ARTERY;  Surgeon: Larina Earthly, MD;  Location: [facility] OR;  Service: Vascular;  Laterality: Right;  With patch angioplasty. HPI: Pt is a 67 y.o. male who presented to [facility] with SOB x1 week, found to have acute liver failure with ascites. Pt was noted to cough after drinking liquids, therefore he was made NPO and swallow evaluation was ordered. PMH: COPD on home oxygen 3.5 L, Emphysema, Tobacco abuse, CHF, HTN, CAD S/P stents x 2, MI x2, renal artery stenosis, S/P RIGHT renal  artery stent, EtOH abuse. Pt had previous swallow testing at [facility] in 2016 after intubation, advanced to Dys 3 diet and nectar thick liquids with a chin tuck. ENT assessed for possible vocal cord injection due to concern for paralysis but they did not feel this was necessary on an inpatient basis.  Subjective: pt describes trouble swallowing previously, has had PNA, but has trouble recalling specific details Assessment / Plan / Recommendation CHL IP CLINICAL IMPRESSIONS 07/04/2017 Clinical Impression Pt presenting with a moderate oral, severe pharyngeal dysphagia, suspect chronic. Oral phase  characterized by piecemeal swallowing and delayed oral transit along with premature spillage of liquid consistencies; pharyngeal phase showed decreased anterior laryngeal movement/ reduced epiglottic deflection and reduced airway closure resulting in frank, silent aspiration of thin liquids before the swallow, penetration of nectar thick liquids before swallow and aspiration post-swallow which appeared to be from vallecular residuals. Cued cough was ineffective in clearing aspirated material. Pt with persistent moderate residuals in the vallecula and pyriform sinuses; when cued to swallow a 2nd time pt reported he did not have enough saliva to swallow. Pt did tolerate nectar-thick liquids by teaspoon with only flash penetration which cleared some of the puree material residuals. Chin tuck and head turn were attempted but unfortunately did not appear effective in prevention of material entering airway or residuals. For now, recommend full liquid diet with liquids thickened to nectar by teaspoon only. Will appreciate palliative consult as it seems pt does not desire altered diet; states "I think I'm nearing the end and this isn't how I wanted it to go" and has not been compliant with thickened liquids in the past. May be a good candidate for [employer] free water protocol. Will continue to follow briefly for diet tolerance/ education.   SLP Visit Diagnosis Dysphagia, oropharyngeal phase (R13.12) Attention and concentration deficit following -- Frontal lobe and executive function deficit following -- Impact on safety and function Severe aspiration risk   CHL IP TREATMENT RECOMMENDATION 07/04/2017 Treatment Recommendations Therapy as outlined in treatment plan below   Prognosis 07/04/2017 Prognosis for Safe Diet Advancement Guarded Barriers to Reach Goals Time post onset;Severity of deficits Barriers/Prognosis Comment -- CHL IP DIET RECOMMENDATION 07/04/2017 SLP Diet Recommendations Nectar thick liquid;Other (Comment) Liquid Administration via Spoon Medication Administration Crushed with puree Compensations Slow rate;Small sips/bites Postural Changes Remain semi-upright after after feeds/meals (Comment);Seated upright at 90 degrees   CHL IP OTHER RECOMMENDATIONS 07/04/2017 Recommended Consults (No Data) Oral Care Recommendations Oral care before and after PO Other Recommendations Clarify dietary restrictions   CHL IP FOLLOW UP RECOMMENDATIONS 07/04/2017 Follow up Recommendations Other (comment)   CHL IP FREQUENCY AND DURATION 07/04/2017 Speech Therapy Frequency (ACUTE ONLY) min 1 x/week Treatment Duration 1 week      CHL IP ORAL PHASE 07/04/2017 Oral Phase Impaired Oral - Pudding Teaspoon -- Oral - Pudding Cup -- Oral - Honey Teaspoon -- Oral - Honey Cup -- Oral - Nectar Teaspoon Delayed oral transit;Premature spillage Oral - Nectar Cup Piecemeal swallowing;Delayed oral transit;Decreased bolus cohesion;Premature spillage Oral - Nectar Straw -- Oral - Thin Teaspoon -- Oral - Thin Cup Piecemeal swallowing;Delayed oral transit;Decreased bolus cohesion;Premature spillage Oral - Thin Straw -- Oral - Puree Premature spillage Oral - Mech Soft -- Oral - Regular -- Oral - Multi-Consistency -- Oral - Pill -- Oral Phase - Comment --  CHL IP PHARYNGEAL PHASE 07/04/2017 Pharyngeal Phase Impaired Pharyngeal- Pudding Teaspoon -- Pharyngeal -- Pharyngeal- Pudding Cup --  Pharyngeal -- Pharyngeal- Honey Teaspoon -- Pharyngeal -- Pharyngeal- Honey Cup -- Pharyngeal -- Pharyngeal- Nectar Teaspoon Delayed swallow initiation-pyriform sinuses;Reduced anterior laryngeal mobility;Reduced airway/laryngeal closure;Penetration/Aspiration during swallow;Pharyngeal residue - valleculae Pharyngeal Material enters airway, remains ABOVE vocal cords then ejected out Pharyngeal- Nectar Cup Delayed swallow initiation-pyriform sinuses;Reduced anterior laryngeal mobility;Reduced airway/laryngeal closure;Penetration/Apiration after swallow;Pharyngeal residue - valleculae;Pharyngeal residue - pyriform Pharyngeal Material enters airway, passes BELOW cords without attempt by patient to eject out (silent aspiration) Pharyngeal- Nectar Straw -- Pharyngeal -- Pharyngeal- Thin Teaspoon -- Pharyngeal -- Pharyngeal- Thin Cup Reduced anterior laryngeal mobility;Reduced airway/laryngeal closure;Penetration/Aspiration before swallow;Pharyngeal residue - valleculae;Pharyngeal residue -  pyriform Pharyngeal Material enters airway, passes BELOW cords without attempt by patient to eject out (silent aspiration) Pharyngeal- Thin Straw -- Pharyngeal -- Pharyngeal- Puree Reduced epiglottic inversion;Reduced anterior laryngeal mobility;Pharyngeal residue - valleculae;Pharyngeal residue - pyriform Pharyngeal -- Pharyngeal- Mechanical Soft -- Pharyngeal -- Pharyngeal- Regular -- Pharyngeal -- Pharyngeal- Multi-consistency -- Pharyngeal -- Pharyngeal- Pill -- Pharyngeal -- Pharyngeal Comment --  CHL IP CERVICAL ESOPHAGEAL PHASE 07/04/2017 Cervical Esophageal Phase WFL Pudding Teaspoon -- Pudding Cup -- Honey Teaspoon -- Honey Cup -- Nectar Teaspoon -- Nectar Cup -- Nectar Straw -- Thin Teaspoon -- Thin Cup -- Thin Straw -- Puree -- Mechanical Soft -- Regular -- Multi-consistency -- Pill -- Cervical Esophageal Comment -- No flowsheet data found. Amy Cecille Aver, MA, CCC-SLP 07/04/2017, 3:04 PM [id number]              Ir  Paracentesis  Result Date: 07/03/2017 INDICATION: Cirrhosis by imaging, CHF, acute renal failure, ascites; request received for diagnostic and therapeutic paracentesis. EXAM: ULTRASOUND GUIDED DIAGNOSTIC AND THERAPEUTIC PARACENTESIS MEDICATIONS: None COMPLICATIONS: None immediate. PROCEDURE: Informed written consent was obtained from the patient after a discussion of the risks, benefits and alternatives to treatment. A timeout was performed prior to the initiation of the procedure. Initial ultrasound scanning demonstrates a moderate amount of ascites within the right mid to lower abdominal quadrant. The right mid to lower abdomen was prepped and draped in the usual sterile fashion. 1% lidocaine with epinephrine was used for local anesthesia. Following this, a 19 gauge, 7-cm, Yueh catheter was introduced. An ultrasound image was saved for documentation purposes. The paracentesis was performed. The catheter was removed and a dressing was applied. The patient tolerated the procedure well without immediate post procedural complication. FINDINGS: A total of approximately 2.5 liters of yellow fluid was removed. Samples were sent to the laboratory as requested by the clinical team. IMPRESSION: Successful ultrasound-guided diagnostic and therapeutic paracentesis yielding 2.5 liters of peritoneal fluid. Read by: Jeananne Rama, PA-C Electronically Signed   By: Gilmer Mor D.O.   On: 07/03/2017 12:34    Cardiac Studies   Echocardiogram 07/03/2017 Study Conclusions - Left ventricle: The cavity size was normal. Systolic function was moderately reduced. The estimated ejection fraction was in the range of 35% to 40%. Wall motion was normal; there were no regional wall motion abnormalities. Doppler parameters are consistent with abnormal left ventricular relaxation (grade 1 diastolic dysfunction). - Ventricular septum: Septal motion showed abnormal function and dyssynchrony consistent with bundle branch  block. The contour showed diastolic flattening and systolic flattening consistent with right ventricular pressure and volume overload. - Aortic valve: Transvalvular velocity was within the normal range. There was no stenosis. There was no regurgitation. - Mitral valve: Transvalvular velocity was within the normal range. There was no evidence for stenosis. There was mild regurgitation. - Right ventricle: The cavity size was severely dilated. Wall thickness was normal. Systolic function was moderately reduced. - Right atrium: The atrium was mildly dilated. - Tricuspid valve: There was moderate regurgitation. - Pulmonary arteries: Systolic pressure was moderately increased. PA peak pressure: 53 mm Hg (S).  Carotid Dopplers 06/25/2013 -Right internal carotid artery stenosis and less than 40% range -Right external carotid artery stenosis present -Left internal carotid artery stenosis 40-59% range -Left vertebral artery and retrograde with brachial pressure gradient present and monophasic left subclavian artery suggestive of subclavian steal syndrome   Patient Profile     67 y.o. male with a hx of severe COPD with chronic respiratory failure on home oxygen, tobacco  abuse, alcohol abuse, CAD with 2 previous MIs status post DES x2, hypertension, CKD, carotid artery stenosis, PVD status post several peripheral interventions and renal artery stent in 2004 who is being seen for CHF. BNP 2731.4 EF 35% to 40% by echo 07/03/17.  Assessment & Plan    Acute systolic heart failure -Elevated BNP on admission, EF 35-40% by echo -He has been diuresed with Lasix and spironolactone.  Also underwent paracentesis removing 2.5 L of fluid from the abdomen.  His weight is now down 20 pounds.  He has had 3 L of urine output in the last 24 hours.  He does not appear to have any excess volume currently.  His renal function has improved but his potassium is low and he is receiving replacement.  I will  stop his Lasix for now and review with Dr. Eden Emms. -It is unclear what is led to his recent reduction in LV systolic function but it could have been possible cirrhosis leading to volume overload or myocardial ischemia or atrial arrhythmia.  The patient is not currently a candidate for left and right heart cath.  We have recommended palliative care consult. -The patient continues on a low-dose beta-blocker, 3.125 mg twice daily.  ACE inhibitor was stopped earlier this year due to hypotension.  I am stopping the Lasix.  He continues on Spironolactone 100 mg daily. -The patient appears chronically ill and cachectic and has had approximately a 60 pound weight loss over the last 2 years -Discharge planning has been initiated with plan for skilled nursing facility placement -The patient already has a follow-up appointment with Dr. Dulce Sellar in [place] scheduled for the middle of July   Hypokalemia -K+ 2.9 this morning.  The patient is receiving IV potassium supplementation -I have discontinued his Lasix.  His Spironolactone is continued.  Will review with Dr. Eden Emms  CAD -Status post MI x2 and DES x2.  Has been managed with aspirin 81 mg, Plavix 75 mg daily, beta-blocker, statin -The patient denies any chest pain.  He has chronic shortness of breath most likely related to his COPD and chronic respiratory failure  Possible atrial fibrillation -Atrial fibrillation on EKG.  Telemetry shows sinus rhythm with PACs alternating with few beats of possible atrial fibrillation or this could be a wandering atrial pacemaker. -No previous known atrial fibrillation, however the patient thinks that he was on Coumadin around the time of his heart attacks in 2004/2005.  He reports no mention of irregular heartbeat at his PCP office visit approximately 2 weeks ago. -CHA2DS2/VAS Stroke Risk Score is 3 (CHF, Vasc Dz, HTN). Anticoagulation indicated for stroke risk reduction, however, would no start at this time due to acute  liver failure and coagulopathy.  -Continue betablocker for rate control and monitor rhythm. Currently rate controlled in the 80's.       Hypertension -Home medications include Lasix 20 mg daily Lopressor 12.5 mg twice daily -Currently on low dose carvedilol 3.125 mg bid, lasix IV and spironolactone 100 mg daily. BP is well controlled.  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 -Wife reports that the patient has only one functioning kidney -Serum creatinine was 1.92 on admission and has trended down, 1.01 today  Acute liver failure with ascites -Remote heavy alcohol use, now just occasional.  -Liver function tests elevated on admission, trending down. ? Probable cirrhosis  -  Paracentesis per IR on 6/26-2.5 L of fluid aspirated and sent for evaluation -Patient had reportedly been taking quite a bit of Tylenol for pain, now on hold. -Has coagulopathy. His INR has been 1.84, 1.60, 1.42.  Hemoglobin is 9.8 today, stable from yesterday. -GI is following.  COPD -Very severe, [facility], last seen in 2017 -With chronic respiratory failure, on home oxygen therapy   For questions or updates, please contact [facility] Please consult [email or web address] for contact info under Cardiology/STEMI.  Oletta CohnSigned,  Janine Hammond, AGNP-C [facility] 07/05/2017  10:14 AM Pager: [phone]  Patient examined chart reviewed. Chronically ill white male Jaundice lungs clear PMI increased ? MR murmur abdomen soft post paracentesis no LE edema. Agree with holding lasix today and supplementing K Aldactone for ascites. Agreee with palliative care consult no role currently for right / left cath. Telemetry with SR multifocal PAC;s no anticoagulation with synthetic liver disease. Right heart failure seems more related to severe COPD and  cirrhosis   Charlton HawsPeter Chamille Werntz

## 2017-07-05 NOTE — Progress Notes (Signed)
NURSING PROGRESS NOTE  Shaun Schmitt [id number] Transfer Data: 07/05/2017 4:07 PM Attending Provider: Drema DallasWoods, Curtis J, MD PCP:Van Domenic SchwabEyk, Jason, MD Code Status: Full  Shaun Schmitt is a 67 y.o. male patient transferred from [facility] -No acute distress noted.  -No complaints of shortness of breath.  -No complaints of chest pain.   Cardiac Monitoring: Box # *24 in place. Cardiac monitor yields:normal sinus rhythm.  Blood pressure 107/61, pulse 74, temperature (!) 97.5 F (36.4 C), temperature source Oral, resp. rate 14, height 6\' 1"  (1.854 m), weight 59.2 kg (130 lb 8.2 oz), SpO2 93 %.   IV Fluids:  IV in place, occlusive dsg intact without redness, IV cath R AC  Allergies:  Codeine  Past Medical History:   has a past medical history of Arterial occlusion (12/28/2014), Arthritis, Bronchitis (April 23, 2012), CAD (coronary artery disease) (12/28/2014), Chronic respiratory failure with hypoxia (HCC) (02/16/2015), COPD, very severe (HCC) (12/28/2014), Elevated LFTs (06/2017), History of tobacco abuse (02/16/2015), Hypertension, Occlusion and stenosis of carotid artery without mention of cerebral infarction (06/25/2013), Pain in both knees, Pancytopenia (HCC) (12/28/2014), Peripheral edema (06/26/2017), Protein-calorie malnutrition, mild (HCC) (12/28/2014), Pulmonary nodule/lesion, solitary (12/28/2014), PVD (peripheral vascular disease) (HCC) (09/25/2011), Renal artery stenosis (HCC) (12/07/2002), and Rhinovirus infection (12/30/2014).  Past Surgical History:   has a past surgical history that includes Joint replacement (2005); pr vein bypass graft,aorto-fem-pop; Knee surgery; Cardiac catheterization; Patch angioplasty (11/26/2011); Repair iliac artery (11/26/2011); abdominal aortagram (N/A, 10/31/2011); and IR Paracentesis (07/03/2017).  Social History:   reports that he has quit smoking. His smoking use included e-cigarettes and cigarettes. He has a 10.00 pack-year smoking history. He has never used  smokeless tobacco. He reports that he drinks alcohol. He reports that he does not use drugs.  Skin: generalized bruising, stage 2 on sacrum   Patient/Family orientated to room. Information packet given to patient/family. Admission inpatient armband information verified with patient/family to include name and date of birth and placed on patient arm. Side rails up x 2, fall assessment and education completed with patient/family. Patient/family able to verbalize understanding of risk associated with falls and verbalized understanding to call for assistance before getting out of bed. Call light within reach. Patient/family able to voice and demonstrate understanding of unit orientation instructions.    Will continue to evaluate and treat per MD orders.

## 2017-07-05 NOTE — Progress Notes (Signed)
  Speech Language Pathology Treatment: Dysphagia  Patient Details Name: Margie EgeJames T Dzik MRN: [id number] DOB: 04/18/1950 Today's Date: 07/05/2017 Time: [id number] SLP Time Calculation (min) (ACUTE ONLY): 19 min  Assessment / Plan / Recommendation Clinical Impression  Pt says he recalls MBS on previous date but cannot verbalize any of the results, stating that his wife "keeps up with that." SLP reviewed results and recommendations with pt stating again that he does not like his altered consistencies, does not want to thicken his liquids. Despite discussion and education he says that he does not understand why he can't eat and drink how he always has. SLP provided Mod cues for use of strategies during breakfast meal with pt trying minimal amounts of half the items on his tray. No overt signs of aspiration were noted, although this was silent on MBS.   Continues to recommend additional conversation with MD and/or palliative care regarding overall GOC given that this may be a more chronic dysphagia, although there could be an element of acute exacerbation from current deconditioning. I am concerned given how altered his diet is, along with his reduced recall and understanding, that there would be an issue of compliance with recommend diet and strategies. Even if he was compliant, I think he would be at a high risk for not maintaining adequate nutrition or hydration. SLP will continue to follow acutely, keeping current recommendations in place pending any changes in GOC.   HPI HPI: Pt is a 67 y.o. male who [facility] SOB x1 week, found to have acute liver failure with ascites. Pt was noted to cough after drinking liquids, therefore he was made NPO and swallow evaluation was ordered. PMH: COPD on home oxygen 3.5 L, Emphysema, Tobacco abuse, CHF, HTN, CAD S/P stents x 2, MI x2, renal artery stenosis, S/P RIGHT renal artery stent, EtOH abuse. Pt [facility] swallow [facility]  [facility] after intubation, advanced to Dys 3 diet and nectar thick liquids with a chin tuck. ENT assessed for possible vocal cord injection due to concern for paralysis but they did not feel this was necessary on an inpatient basis.      SLP Plan  Continue with current plan of care       Recommendations  Diet recommendations: Nectar-thick liquid(full liquid diet; ice chips okay) Liquids provided via: Teaspoon Medication Administration: Crushed with puree Supervision: Patient able to self feed;Full supervision/cueing for compensatory strategies Compensations: Slow rate;Small sips/bites Postural Changes and/or Swallow Maneuvers: Seated upright 90 degrees;Upright 30-60 min after meal                Oral Care Recommendations: Oral care QID Follow up Recommendations: Skilled Nursing facility SLP Visit Diagnosis: Dysphagia, oropharyngeal phase (R13.12) Plan: Continue with current plan of care       GO                Maxcine Hamaiewonsky, Clarabelle Oscarson 07/05/2017, 9:28 AM   Maxcine HamLaura Paiewonsky, M.A. CCC-SLP [phone]

## 2017-07-05 NOTE — Progress Notes (Signed)
PROGRESS NOTE    Shaun Schmitt  ZOX:[id number] DOB: 09-22-1950 DOA: 07/02/2017 PCP: Crist Fat, MD   Brief Narrative:  67 y.o. male PMHx COPD on home oxygen 3.5 L,, Emphysema,Tobacco abuse,, CHF, HTN, CAD S/P stents x 2, MI x2, renal artery stenosis, S/P RIGHT renal artery stent, EtOH abuse,   Presenting to [facility] with shortness of breath for about 1 week, worse on exertion, generalized weakness, cough, without fever or chills. Denies rhinorrhea or hemoptysis. Denies fevers, chills, night sweats or mucositis.   Denies any chest pain, chest wall pain or palpitations. Denies any sick contacts or recent long distant travels. Denies any abdominal pain. Has decreased appetite but he is unaware of weight loss.  And occasional nausea without vomiting.  He denies dysuria, gross hematuria, or changes since his urine color, or changes in his stools.  Denies dizziness or vertigo. Denies lower extremity swelling.  He denies confusion.  denies any vision changes, double vision or headaches.  To be a heavy drinker, but he takes occasionally a beer.  He quit tobacco many years ago, he does smoke vape.  He denies any recreational drug use.  He does take significant amount of Tylenol for pain.  He is not very compliant with his regular medicines.  At [facility] ER, the patient was tachypneic, and had intermittent periods of bradycardia and tachycardia, and anxious, however, on presentation to [facility] hospital, the patient is reporting feeling better.     Subjective: 6/28/ AO x4, positive chronic S OB, negative CP, negative abdominal pain.  Patient waiting placement to collapse SNF.  Considering changing CODE STATUS to DNR, discussing with wife.        Assessment & Plan:   Principal Problem:   Acute liver failure Active Problems:   PVD (peripheral vascular disease) (HCC)   Peripheral vascular disease, unspecified (HCC)   Acute on chronic respiratory failure with hypoxia (HCC)   Chronic  respiratory failure with hypoxia (HCC)   CAD (coronary artery disease)   COPD, very severe (HCC)   History of tobacco abuse   Pressure injury of skin   Ascites   Microcytic anemia  Acute liver failure with ascites/ MELD score= 27, 19.6% 73-month mortality -Remote Hx EtOH abuse  -6/26 IR paracentesis: 2.5 L fluid aspirated, labs pending -Hold all Tylenol products -Acute hepatitis panel negative -GI following - Echocardiogram consistent with systolic and diastolic CHF, pulmonary HTN.  See results below - Trend ammonia Recent Labs  Lab 07/02/17 1403 07/04/17 0401 07/05/17 0451  AMMONIA 30 12 20     Coagulopathy -Secondary to liver failure Recent Labs  Lab 07/02/17 0415 07/03/17 0414 07/04/17 0401 07/05/17 0451  INR 2.41 1.84 1.60 1.42  -Normalized with vitamin K 5 mg x 3 doses - Vitamin K 5 mg 2 x /wk   COPD - On 3.5 L O2 at home.  Currently on 4 L O2 via [place] with SPO2 91% - CXR positive for emphysema see results below - RIGHT lung lucency?  Artifact vs lung nodule.  Chest CT pending  -Duoneb q 4hr -Dulera 100-5 mcg 2 puff BID  Infrarenal 3.2 cm abdominal aortic aneurysm. No bleeding issues -Will need to follow with Vascular surgery as outpatient with repeat CT in 6 months     Acute systolic and diastolic CHF (baseline weight unknown) -Strict in and out since admission negative -7.1 L -Daily weight Filed Weights   07/03/17 0317 07/04/17 0338 07/05/17 0500  Weight: 150 lb 5.7 oz (68.2 kg) 138 lb  0.1 oz (62.6 kg) 130 lb 8.2 oz (59.2 kg)  -Transfuse for hemoglobin<8 -Echocardiogram consistent with systolic and diastolic CHF see results below.  Does not appear that patient has seen cardiologist since. -Consulted cardiology - 6/27 Coreg 3.125 mg BID - Lasix 40 mg daily: Per GI for fluid management -Spironolactone 100 mg daily: Per GI fluid management  Pulmonary HTN -See CHF  Essential HTN -See CHF  CAD S/P MI 2012  - EKG sinus rhythm, with occasional PVCs,  incomplete right bundle branch block, right ventricular hypertrophy, QTC 459. Tn 0.14  .Denies CP Recent Labs  Lab 07/02/17 0415 07/02/17 0843 07/02/17 1403  TROPONINI 0.17* 0.16* 0.16*  - Troponins remain elevated but stable.  Patient asymptomatic. -Plavix 75 mg daily  -Aspirin on hold secondary acute liver failure   -Patient already with coagulopathy secondary to liver failure will not start on heparin.    Acute kidney injury -Most likely secondary to dehydration -Monitor closely Recent Labs  Lab 07/02/17 0415 07/03/17 0414 07/04/17 0401 07/05/17 0451  CREATININE 1.92* 1.51* 1.16 1.01  -resolved  HLD -  Lipid panel within ADA guidelines   Depression - Zoloft 100 mg daily   Hypokalemia - Potassium goal> 4 - Potassium IV 50 mEq -Recheck K/Mg @1500   Dysphasia - RN staff witnessed what appeared to be several episodes of aspiration.  Patient now n.p.o. - .  6/28 patient failed swallow evaluation: Fluid consistency nectar thick.  Unless patient decides on DNR    Goals of care -6/27 PALLIATIVE CARE: Acute liver failure, COPD, acute on chronic diastolic CHF evaluate for change of CODE STATUS to DNR, short-term  Vs long-term goals of care    DVT prophylaxis: SCD Code Status: Full Family Communication: None Disposition Plan: TBD   Consultants:  GI Cardiology Palliative care     Procedures/Significant Events:  CT Abdomen and Pelvis w/o contrast at outside hospital:-Remarkable for  and morphologic changes of cirrhosis with moderate to large volume of ascites.  Ultrasound of the abdomen outside hospital:-moderate ascites, predominantly perihepatic, mildly echogenic liver parenchyma, likely due to steatosis and or fibrosis, no definite liver surface irregularity or masses. 6/25 PCXR:Severe emphysema with areas of subpleural scarring. 2. Right lung lucency most likely artifactual. CT may provide better evaluation if clinically indicated. No interval change since  the earlier radiograph. 6/26 IR paracentesis: 2.5 L of yellow fluid aspirated 6/26 echocardiogram:- Left ventricle: LVEF = 35% to 40%.-Grade 1 diastolic dysfunction. - Ventricular septum: Septal motion showed abnormal function and  dyssynchrony consistent with bundle branch block.  -Right ventricle:  severely dilated.-Tricuspid valve: moderate regurgitation. - Pulmonary arteries:  PA peak pressure: 53 mm Hg (S).    I have personally reviewed and interpreted all radiology studies and my findings are as above.  VENTILATOR SETTINGS:    Cultures 6/25 acute hepatitis panel negative 6/25 HIV negative     Antimicrobials: Anti-infectives (From admission, onward)   None      Devices    LINES / TUBES:      Continuous Infusions:   Objective: Vitals:   07/05/17 0408 07/05/17 0500 07/05/17 0700 07/05/17 0757  BP:   127/75   Pulse: 85  66 100  Resp: 15  17 (!) 21  Temp:   97.6 F (36.4 C)   TempSrc:   Oral   SpO2: 93%  92% 92%  Weight:  130 lb 8.2 oz (59.2 kg)    Height:        Intake/Output Summary (Last 24 hours) at 07/05/2017 [phone]  Last data filed at 07/05/2017 3664 Gross per 24 hour  Intake -  Output 3050 ml  Net -3050 ml   Filed Weights   07/03/17 0317 07/04/17 0338 07/05/17 0500  Weight: 150 lb 5.7 oz (68.2 kg) 138 lb 0.1 oz (62.6 kg) 130 lb 8.2 oz (59.2 kg)    Physical Exam:  General: A/O x4, positive chronic respiratory distress, cachectic  Neck:  Negative scars, masses, torticollis, lymphadenopathy, JVD Lungs: Clear to auscultation bilaterally without wheezes or crackles Cardiovascular: Regular rate and rhythm without murmur gallop or rub normal S1 and S2 Abdomen: negative abdominal pain, nondistended, positive soft, bowel sounds, no rebound, no ascites, no appreciable mass Extremities: No significant cyanosis, clubbing, or edema bilateral lower extremities Skin: Negative rashes, lesions, ulcers Psychiatric:  Negative depression, negative anxiety,  negative fatigue, negative mania  Central nervous system:  Cranial nerves II through XII intact, tongue/uvula midline, all extremities muscle strength 5/5, sensation intact throughout, negative dysarthria, negative expressive aphasia, negative receptive aphasia.    .     Data Reviewed: Care during the described time interval was provided by me .  I have reviewed this patient's available data, including medical history, events of note, physical examination, and all test results as part of my evaluation.   CBC: Recent Labs  Lab 07/02/17 0415 07/03/17 0414 07/04/17 0401 07/05/17 0451  WBC 9.0 12.4* 9.0 7.1  NEUTROABS 8.4*  --   --   --   HGB 10.9* 10.3* 9.9* 9.8*  HCT 37.4* 35.4* 34.3* 34.8*  MCV 79.1 78.8 80.0 80.2  PLT 107* 105* 84* 74*   Basic Metabolic Panel: Recent Labs  Lab 07/02/17 0415 07/03/17 0414 07/04/17 0401 07/05/17 0451  NA 141 141 143 145  K 4.0 3.0* 3.6 2.9*  CL 100 99 99 96*  CO2 28 32 36* 39*  GLUCOSE 129* 94 85 86  BUN 44* 44* 30* 19  CREATININE 1.92* 1.51* 1.16 1.01  CALCIUM 8.7* 8.3* 8.1* 8.5*  MG  --   --  1.8 1.9   GFR: Estimated Creatinine Clearance: 61.9 mL/min (by C-G formula based on SCr of 1.01 mg/dL). Liver Function Tests: Recent Labs  Lab 07/02/17 0415 07/03/17 0414  AST 1,093* 517*  ALT 637* 546*  ALKPHOS 165* 147*  BILITOT 3.0* 2.4*  PROT 6.0* 5.9*  ALBUMIN 3.0* 2.8*   No results for input(s): LIPASE, AMYLASE in the last 168 hours. Recent Labs  Lab 07/02/17 1403 07/04/17 0401 07/05/17 0451  AMMONIA 30 12 20    Coagulation Profile: Recent Labs  Lab 07/02/17 0415 07/03/17 0414 07/04/17 0401 07/05/17 0451  INR 2.41 1.84 1.60 1.42   Cardiac Enzymes: Recent Labs  Lab 07/02/17 0415 07/02/17 0843 07/02/17 1403  TROPONINI 0.17* 0.16* 0.16*   BNP (last 3 results) No results for input(s): PROBNP in the last 8760 hours. HbA1C: No results for input(s): HGBA1C in the last 72 hours. CBG: No results for input(s):  GLUCAP in the last 168 hours. Lipid Profile: Recent Labs    07/04/17 0401  CHOL 109  HDL 27*  LDLCALC 64  TRIG 89  CHOLHDL 4.0   Thyroid Function Tests: No results for input(s): TSH, T4TOTAL, FREET4, T3FREE, THYROIDAB in the last 72 hours. Anemia Panel: No results for input(s): VITAMINB12, FOLATE, FERRITIN, TIBC, IRON, RETICCTPCT in the last 72 hours. Urine analysis:    Component Value Date/Time   COLORURINE YELLOW 07/02/2017 1654   APPEARANCEUR CLEAR 07/02/2017 1654   LABSPEC 1.011 07/02/2017 1654   PHURINE 5.0 07/02/2017 1654  GLUCOSEU NEGATIVE 07/02/2017 1654   HGBUR SMALL (A) 07/02/2017 1654   BILIRUBINUR NEGATIVE 07/02/2017 1654   KETONESUR NEGATIVE 07/02/2017 1654   PROTEINUR NEGATIVE 07/02/2017 1654   UROBILINOGEN 0.2 11/26/2011 0805   NITRITE NEGATIVE 07/02/2017 1654   LEUKOCYTESUR NEGATIVE 07/02/2017 1654   Sepsis Labs: @LABRCNTIP (procalcitonin:4,lacticidven:4)  )No results found for this or any previous visit (from the past 240 hour(s)).       Radiology Studies: Dg Swallowing Func-speech Pathology  Result Date: 07/04/2017 Objective Swallowing Evaluation: Type of Study: MBS-Modified Barium Swallow Study  Patient Details Name: Shaun Schmitt MRN: [id number] Date of Birth: [birth year] Today's Date: 07/04/2017 Time: SLP Start Time (ACUTE ONLY): 1330 -SLP Stop Time (ACUTE ONLY): 1400 SLP Time Calculation (min) (ACUTE ONLY): 30 min Past Medical History: Past Medical History: Diagnosis Date . 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 Arthritis   Gout . Bronchitis April 23, 2012 . CAD (coronary artery disease) 12/28/2014  Stents x2 on ASA and Plavix . Chronic respiratory failure with hypoxia (HCC) 02/16/2015 . COPD, very severe (HCC) 12/28/2014 . Elevated LFTs 06/2017 . History of tobacco abuse 02/16/2015 . Hypertension  . Occlusion and stenosis of carotid artery without mention of cerebral infarction 06/25/2013 .  Pain in both knees   Right worse , bilateral Hips and ankles, numbness  . Pancytopenia (HCC) 12/28/2014 . Peripheral edema 06/26/2017 . Protein-calorie malnutrition, mild (HCC) 12/28/2014 . Pulmonary nodule/lesion, solitary 12/28/2014  RUL 8 mm nodule on OSH CT chest 11/27/14 . PVD (peripheral vascular disease) (HCC) 09/25/2011 . Renal artery stenosis (HCC) 12/07/2002  renal artery stenting 2004 and found right renal artery stent occlusion on angiography in 2013.   Marland Kitchen Rhinovirus infection 12/30/2014 Past Surgical History: Past Surgical History: Procedure Laterality Date . ABDOMINAL AORTAGRAM N/A 10/31/2011  Procedure: ABDOMINAL AORTAGRAM;  Surgeon: Larina Earthly, MD;  Location: [facility] CATH LAB;  Service: Cardiovascular;  Laterality: N/A; . CARDIAC CATHETERIZATION    cath 05/21/00 ([facility]) showed 50% ostial OM, 25-30% mRCA, EF 40-45%) . IR PARACENTESIS  07/03/2017 . JOINT REPLACEMENT  2005  Right Hip . KNEE SURGERY   . PATCH ANGIOPLASTY  11/26/2011  Procedure: PATCH ANGIOPLASTY;  Surgeon: Larina Earthly, MD;  Location: [facility] OR;  Service: Vascular;  Laterality: Right;  Using Hemashield 0.8cm x 7.6 cm vascular patch. . PR VEIN BYPASS GRAFT,AORTO-FEM-POP   . REPAIR ILIAC ARTERY  11/26/2011  Procedure: REPAIR ILIAC ARTERY;  Surgeon: Larina Earthly, MD;  Location: [facility] OR;  Service: Vascular;  Laterality: Right;  With patch angioplasty. HPI: Pt is a 67 y.o. male who presented to [facility] with SOB x1 week, found to have acute liver failure with ascites. Pt was noted to cough after drinking liquids, therefore he was made NPO and swallow evaluation was ordered. PMH: COPD on home oxygen 3.5 L, Emphysema, Tobacco abuse, CHF, HTN, CAD S/P stents x 2, MI x2, renal artery stenosis, S/P RIGHT renal artery stent, EtOH abuse. Pt had previous swallow testing at [facility] in 2016 after intubation, advanced to Dys 3 diet and nectar thick liquids with a chin tuck. ENT assessed for possible vocal cord injection due to concern for paralysis but they  did not feel this was necessary on an inpatient basis.  Subjective: pt describes trouble swallowing previously, has had PNA, but has trouble recalling specific details Assessment / Plan / Recommendation CHL IP CLINICAL IMPRESSIONS 07/04/2017 Clinical Impression Pt presenting with a moderate oral, severe pharyngeal dysphagia, suspect chronic. Oral  phase characterized by piecemeal swallowing and delayed oral transit along with premature spillage of liquid consistencies; pharyngeal phase showed decreased anterior laryngeal movement/ reduced epiglottic deflection and reduced airway closure resulting in frank, silent aspiration of thin liquids before the swallow, penetration of nectar thick liquids before swallow and aspiration post-swallow which appeared to be from vallecular residuals. Cued cough was ineffective in clearing aspirated material. Pt with persistent moderate residuals in the vallecula and pyriform sinuses; when cued to swallow a 2nd time pt reported he did not have enough saliva to swallow. Pt did tolerate nectar-thick liquids by teaspoon with only flash penetration which cleared some of the puree material residuals. Chin tuck and head turn were attempted but unfortunately did not appear effective in prevention of material entering airway or residuals. For now, recommend full liquid diet with liquids thickened to nectar by teaspoon only. Will appreciate palliative consult as it seems pt does not desire altered diet; states "I think I'm nearing the end and this isn't how I wanted it to go" and has not been compliant with thickened liquids in the past. May be a good candidate for [employer] free water protocol. Will continue to follow briefly for diet tolerance/ education.  SLP Visit Diagnosis Dysphagia, oropharyngeal phase (R13.12) Attention and concentration deficit following -- Frontal lobe and executive function deficit following -- Impact on safety and function Severe aspiration risk   CHL IP TREATMENT  RECOMMENDATION 07/04/2017 Treatment Recommendations Therapy as outlined in treatment plan below   Prognosis 07/04/2017 Prognosis for Safe Diet Advancement Guarded Barriers to Reach Goals Time post onset;Severity of deficits Barriers/Prognosis Comment -- CHL IP DIET RECOMMENDATION 07/04/2017 SLP Diet Recommendations Nectar thick liquid;Other (Comment) Liquid Administration via Spoon Medication Administration Crushed with puree Compensations Slow rate;Small sips/bites Postural Changes Remain semi-upright after after feeds/meals (Comment);Seated upright at 90 degrees   CHL IP OTHER RECOMMENDATIONS 07/04/2017 Recommended Consults (No Data) Oral Care Recommendations Oral care before and after PO Other Recommendations Clarify dietary restrictions   CHL IP FOLLOW UP RECOMMENDATIONS 07/04/2017 Follow up Recommendations Other (comment)   CHL IP FREQUENCY AND DURATION 07/04/2017 Speech Therapy Frequency (ACUTE ONLY) min 1 x/week Treatment Duration 1 week      CHL IP ORAL PHASE 07/04/2017 Oral Phase Impaired Oral - Pudding Teaspoon -- Oral - Pudding Cup -- Oral - Honey Teaspoon -- Oral - Honey Cup -- Oral - Nectar Teaspoon Delayed oral transit;Premature spillage Oral - Nectar Cup Piecemeal swallowing;Delayed oral transit;Decreased bolus cohesion;Premature spillage Oral - Nectar Straw -- Oral - Thin Teaspoon -- Oral - Thin Cup Piecemeal swallowing;Delayed oral transit;Decreased bolus cohesion;Premature spillage Oral - Thin Straw -- Oral - Puree Premature spillage Oral - Mech Soft -- Oral - Regular -- Oral - Multi-Consistency -- Oral - Pill -- Oral Phase - Comment --  CHL IP PHARYNGEAL PHASE 07/04/2017 Pharyngeal Phase Impaired Pharyngeal- Pudding Teaspoon -- Pharyngeal -- Pharyngeal- Pudding Cup -- Pharyngeal -- Pharyngeal- Honey Teaspoon -- Pharyngeal -- Pharyngeal- Honey Cup -- Pharyngeal -- Pharyngeal- Nectar Teaspoon Delayed swallow initiation-pyriform sinuses;Reduced anterior laryngeal mobility;Reduced airway/laryngeal  closure;Penetration/Aspiration during swallow;Pharyngeal residue - valleculae Pharyngeal Material enters airway, remains ABOVE vocal cords then ejected out Pharyngeal- Nectar Cup Delayed swallow initiation-pyriform sinuses;Reduced anterior laryngeal mobility;Reduced airway/laryngeal closure;Penetration/Apiration after swallow;Pharyngeal residue - valleculae;Pharyngeal residue - pyriform Pharyngeal Material enters airway, passes BELOW cords without attempt by patient to eject out (silent aspiration) Pharyngeal- Nectar Straw -- Pharyngeal -- Pharyngeal- Thin Teaspoon -- Pharyngeal -- Pharyngeal- Thin Cup Reduced anterior laryngeal mobility;Reduced airway/laryngeal closure;Penetration/Aspiration before swallow;Pharyngeal residue - valleculae;Pharyngeal  residue - pyriform Pharyngeal Material enters airway, passes BELOW cords without attempt by patient to eject out (silent aspiration) Pharyngeal- Thin Straw -- Pharyngeal -- Pharyngeal- Puree Reduced epiglottic inversion;Reduced anterior laryngeal mobility;Pharyngeal residue - valleculae;Pharyngeal residue - pyriform Pharyngeal -- Pharyngeal- Mechanical Soft -- Pharyngeal -- Pharyngeal- Regular -- Pharyngeal -- Pharyngeal- Multi-consistency -- Pharyngeal -- Pharyngeal- Pill -- Pharyngeal -- Pharyngeal Comment --  CHL IP CERVICAL ESOPHAGEAL PHASE 07/04/2017 Cervical Esophageal Phase WFL Pudding Teaspoon -- Pudding Cup -- Honey Teaspoon -- Honey Cup -- Nectar Teaspoon -- Nectar Cup -- Nectar Straw -- Thin Teaspoon -- Thin Cup -- Thin Straw -- Puree -- Mechanical Soft -- Regular -- Multi-consistency -- Pill -- Cervical Esophageal Comment -- No flowsheet data found. Amy Cecille Aver, MA, CCC-SLP 07/04/2017, 3:04 PM [id number]              Ir Paracentesis  Result Date: 07/03/2017 INDICATION: Cirrhosis by imaging, CHF, acute renal failure, ascites; request received for diagnostic and therapeutic paracentesis. EXAM: ULTRASOUND GUIDED DIAGNOSTIC AND THERAPEUTIC PARACENTESIS  MEDICATIONS: None COMPLICATIONS: None immediate. PROCEDURE: Informed written consent was obtained from the patient after a discussion of the risks, benefits and alternatives to treatment. A timeout was performed prior to the initiation of the procedure. Initial ultrasound scanning demonstrates a moderate amount of ascites within the right mid to lower abdominal quadrant. The right mid to lower abdomen was prepped and draped in the usual sterile fashion. 1% lidocaine with epinephrine was used for local anesthesia. Following this, a 19 gauge, 7-cm, Yueh catheter was introduced. An ultrasound image was saved for documentation purposes. The paracentesis was performed. The catheter was removed and a dressing was applied. The patient tolerated the procedure well without immediate post procedural complication. FINDINGS: A total of approximately 2.5 liters of yellow fluid was removed. Samples were sent to the laboratory as requested by the clinical team. IMPRESSION: Successful ultrasound-guided diagnostic and therapeutic paracentesis yielding 2.5 liters of peritoneal fluid. Read by: Jeananne Rama, PA-C Electronically Signed   By: Gilmer Mor D.O.   On: 07/03/2017 12:34        Scheduled Meds: . aspirin EC  81 mg Oral Daily  . carvedilol  3.125 mg Oral BID WC  . clopidogrel  75 mg Oral Daily  . furosemide  40 mg Intravenous BID  . ipratropium-albuterol  3 mL Nebulization Q4H  . mouth rinse  15 mL Mouth Rinse BID  . mometasone-formoterol  2 puff Inhalation BID  . sertraline  100 mg Oral Daily  . spironolactone  100 mg Oral Daily   Continuous Infusions:   LOS: 3 days    Time spent: 40 minutes    Kerriann Kamphuis, Roselind Messier, MD [facility] Pager [phone]   If 7PM-7AM, please contact night-coverage [email or web address] Password [email or web address] 07/05/2017, 8:11 AM

## 2017-07-05 NOTE — Clinical Social Work Note (Addendum)
CSW spoke with MD and palliative NP who stated patient and wife's first preference is now [facility]. CSW confirmed with patient and his wife. CSW left voicemail for admissions coordinator asking them to review referral and start insurance authorization if patient is appropriate and they have beds in case patient is a weekend or Monday discharge.  Charlynn CourtSarah Carry Weesner, CSW [phone]  2:13 pm No call back from [facility] yet although it does show they have read the referral. CSW called and left another voicemail asking for update on referral decision.  Charlynn CourtSarah Knoah Nedeau, CSW [phone]

## 2017-07-05 NOTE — Evaluation (Signed)
Occupational Therapy Evaluation Patient Details Name: Shaun Schmitt MRN: [id number] DOB: 1950/10/26 Today's Date: 07/05/2017    History of Present Illness 67 yo admitted with malaise, fatigue, SOB with acute liver failure with CHF s/p paracentesis. PMHx: ureteral stenosis; PVD; pancytopenia; carotid stenosis; CAD; HTN; and severe COPD on 3.5L home O2    Clinical Impression   Pt admitted with above. He demonstrates the below listed deficits and will benefit from continued OT to maximize safety and independence with BADLs.  Pt presents to OT with generalized weakness, decreased activity tolerance, impaired balance, impaired cognition.  He currently requires min - mod A for ADLs, but fatigues quite rapidly requiring frequent and extended rest breaks.  02 sats decreased to 76% on 4L with max HR 134.   PTA, he lived with wife.  He is unable to provide reliable info re: PLOF.  Recommend SNF level rehab.       Follow Up Recommendations  SNF;Supervision/Assistance - 24 hour    Equipment Recommendations  None recommended by OT    Recommendations for Other Services       Precautions / Restrictions Precautions Precautions: Fall Precaution Comments: watch sats Restrictions Weight Bearing Restrictions: No      Mobility Bed Mobility Overal bed mobility: Needs Assistance Bed Mobility: Sit to Supine     Supine to sit: Min guard;HOB elevated     General bed mobility comments: HOB elevated and increased time   Transfers Overall transfer level: Needs assistance Equipment used: Rolling walker (2 wheeled) Transfers: Sit to/from [employer] Sit to Stand: Min guard Stand pivot transfers: Min assist       General transfer comment: min guard for safety and min A to control descent when sitting.      Balance Overall balance assessment: Needs assistance Sitting-balance support: Feet supported Sitting balance-Leahy Scale: Fair     Standing balance support:  Bilateral upper extremity supported;During functional activity Standing balance-Leahy Scale: Poor Standing balance comment: reliant on bil UE support                           ADL either performed or assessed with clinical judgement   ADL Overall ADL's : Needs assistance/impaired Eating/Feeding: Supervision/ safety;Sitting   Grooming: Wash/dry hands;Wash/dry face;Oral care;Brushing hair;Set up;Supervision/safety;Sitting   Upper Body Bathing: Supervision/ safety;Sitting   Lower Body Bathing: Moderate assistance;Sit to/from stand   Upper Body Dressing : Minimal assistance;Sitting   Lower Body Dressing: Moderate assistance;Sit to/from stand   Toilet Transfer: Minimal assistance;Stand-pivot;BSC;RW   Toileting- Clothing Manipulation and Hygiene: Moderate assistance;Sit to/from stand       Functional mobility during ADLs: Minimal assistance;Min guard;Rolling walker General ADL Comments: Pt fatigues rapidly with activity.  Requires freqent, extended rest breaks during  ADL tasks      Vision Baseline Vision/History: Wears glasses Wears Glasses: At all times Patient Visual Report: No change from baseline       Perception     Praxis      Pertinent Vitals/Pain Pain Assessment: Faces Faces Pain Scale: Hurts little more Pain Location: bil. feet  Pain Descriptors / Indicators: Grimacing;Aching Pain Intervention(s): Monitored during session     Hand Dominance     Extremity/Trunk Assessment Upper Extremity Assessment Upper Extremity Assessment: Generalized weakness   Lower Extremity Assessment Lower Extremity Assessment: Defer to PT evaluation   Cervical / Trunk Assessment Cervical / Trunk Assessment: Kyphotic   Communication Communication Communication: No difficulties   Cognition Arousal/Alertness: Awake/alert Behavior During  Therapy: WFL for tasks assessed/performed Overall Cognitive Status: Impaired/Different from baseline Area of Impairment:  Memory;Attention;Following commands;Safety/judgement;Awareness;Problem solving                   Current Attention Level: Selective Memory: Decreased short-term memory;Decreased recall of precautions Following Commands: Follows one step commands consistently;Follows multi-step commands inconsistently Safety/Judgement: Decreased awareness of safety;Decreased awareness of deficits Awareness: Intellectual Problem Solving: Difficulty sequencing;Requires verbal cues;Requires tactile cues General Comments: Pt unable to recall his swallowing precautions.  He demonstates poor awareness of his deficits and how it impacts functional performance    General Comments  HR to 134 max, and 02 sat decreased to 76 on 4L supplemental 02.  Encouraged pursed lip breathing with 02 sats slowly increasing to low 90s     Exercises     Shoulder Instructions      Home Living Family/patient expects to be discharged to:: Skilled nursing facility Living Arrangements: Spouse/significant other Available Help at Discharge: Family;Available 24 hours/day Type of Home: House Home Access: Stairs to enter [employer] CorporationEntrance Stairs-Number of Steps: 2   Home Layout: One level     Bathroom Shower/Tub: Chief Strategy OfficerTub/shower unit   Bathroom Toilet: Standard     Home Equipment: Environmental consultantWalker - 2 wheels;Cane - single point;Bedside commode;Shower seat;Wheelchair - manual          Prior Functioning/Environment Level of Independence: Independent with assistive device(s);Needs assistance  Gait / Transfers Assistance Needed: pt states he walks room to room in house with RW ADL's / Homemaking Assistance Needed: Pt vague when providing info re: PLOF.  He will state he requires assistance, then report he doesn't require assistance with ADLs             OT Problem List: Decreased strength;Decreased activity tolerance;Impaired balance (sitting and/or standing);Decreased cognition;Decreased safety awareness;Decreased knowledge of use of DME or  AE;Cardiopulmonary status limiting activity;Pain      OT Treatment/Interventions: Self-care/ADL training;Therapeutic exercise;Energy conservation;DME and/or AE instruction;Therapeutic activities;Cognitive remediation/compensation;Balance training;Patient/family education    OT Goals(Current goals can be found in the care plan section) Acute Rehab OT Goals Patient Stated Goal: to go home as soon as possible.  Pt unable to generate more specific goals  OT Goal Formulation: With patient Time For Goal Achievement: 2017/10/21 Potential to Achieve Goals: Good ADL Goals Pt Will Perform Lower Body Bathing: with min guard assist;sit to/from stand Pt Will Perform Upper Body Dressing: with supervision;sitting Pt Will Perform Lower Body Dressing: with min guard assist;sit to/from stand Pt Will Transfer to Toilet: with min guard assist;ambulating;regular height toilet;bedside commode;grab bars Pt Will Perform Toileting - Clothing Manipulation and hygiene: with min guard assist;sit to/from stand Pt/caregiver will Perform Home Exercise Program: Increased strength;Both right and left upper extremity;With theraband;With Supervision;With written HEP provided  OT Frequency: Min 2X/week   Barriers to D/C: Decreased caregiver support  wife unable to provide current level of assistance        Co-evaluation              AM-PAC PT "6 Clicks" Daily Activity     Outcome Measure Help from another person eating meals?: A Little Help from another person taking care of personal grooming?: A Little Help from another person toileting, which includes using toliet, bedpan, or urinal?: A Lot Help from another person bathing (including washing, rinsing, drying)?: A Lot Help from another person to put on and taking off regular upper body clothing?: A Little Help from another person to put on and taking off regular lower body clothing?: A Lot  6 Click Score: 15   End of Session Equipment Utilized During Treatment:  Rolling walker;Oxygen Nurse Communication: Mobility status  Activity Tolerance: Patient limited by fatigue Patient left: in chair;with call bell/phone within reach;with chair alarm set  OT Visit Diagnosis: Unsteadiness on feet (R26.81)                Time: 1610-9604 OT Time Calculation (min): 24 min Charges:  OT General Charges $OT Visit: 1 Visit OT Evaluation $OT Eval Moderate Complexity: 1 Mod OT Treatments $Therapeutic Activity: 8-22 mins G-Codes:     [employer], OTR/L [phone]   Jeani Hawking M 07/05/2017, 10:38 AM

## 2017-07-05 NOTE — Progress Notes (Addendum)
Daily Rounding Note  07/05/2017, 11:48 AM  LOS: 3 days   SUBJECTIVE:   Chief complaint:     No complaints.  Not happy to have to remain in hospital.  No abd pain Not happy with FL restricted diet.    OBJECTIVE:         Vital signs in last 24 hours:    Temp:  [96.1 F (35.6 C)-97.7 F (36.5 C)] 97.6 F (36.4 C) (06/28 0700) Pulse Rate:  [50-145] 60 (06/28 1105) Resp:  [13-21] 13 (06/28 1105) BP: (95-136)/(57-89) 127/75 (06/28 0833) SpO2:  [89 %-99 %] 95 % (06/28 1105) Weight:  [130 lb 8.2 oz (59.2 kg)] 130 lb 8.2 oz (59.2 kg) (06/28 0500) Last BM Date: 07/02/17 Filed Weights   07/03/17 0317 07/04/17 0338 07/05/17 0500  Weight: 150 lb 5.7 oz (68.2 kg) 138 lb 0.1 oz (62.6 kg) 130 lb 8.2 oz (59.2 kg)   General: looks the same, thin/ill.   Alert, comfortable   Heart: RRR  Chest: clear but diminished bil Abdomen: soft, NT, ND.  Thin.    Extremities: no CCE Neuro/Psych:  Oriented x 3.  Alert, moves all 4s.  No tremor.    Intake/Output from previous day: 06/27 0701 - 06/28 0700 In: -  Out: 3050 [Urine:3050]   Lab Results: Recent Labs    07/03/17 0414 07/04/17 0401 07/05/17 0451  WBC 12.4* 9.0 7.1  HGB 10.3* 9.9* 9.8*  HCT 35.4* 34.3* 34.8*  PLT 105* 84* 74*   BMET Recent Labs    07/03/17 0414 07/04/17 0401 07/05/17 0451  NA 141 143 145  K 3.0* 3.6 2.9*  CL 99 99 96*  CO2 32 36* 39*  GLUCOSE 94 85 86  BUN 44* 30* 19  CREATININE 1.51* 1.16 1.01  CALCIUM 8.3* 8.1* 8.5*    PT/INR Recent Labs    07/04/17 0401 07/05/17 0451  LABPROT 18.9* 17.2*  INR 1.60 1.42     ASSESMENT:   *   Suspected cirrhosis of liver.  ? From remote ETOH abuse vs or in combo with R heart failure.   Viral hepatitis serologies negative. Alpha 1 AT normal, AMA normal, smooth muscle Ab negative,  IgG 683 (norm700-1600)  ANA pending   Marked transaminitis, suspected due to superimposed hypoperfusion (?from MI?):  improved.   *   Coagulopathy. Now near normal, day 3 of 3 PO Vitamin K.   With his protein calorie  Malnutrition, he may have inadequate Vitamin K intake.    *   Thrombocytopenia.  Progressive.    *  Ascites.  S/p 2.5 liter tap 6/26.  SAAG 1.4. No SBP.   Aldactone 100 mg, start 6/27.  Lasix discontinued 6/28.     *   CHF.  Improved.  On Aldactone. Cards stopped Lasix today.    CAD.  Hx MI and DES x 2.   ? Recurrent MI.  A fib.  On 81 ASA Plavix (PTA as well).   EF 35 - 40%.  Grade 1 diastolic dysfx.  Increased right heart pressures and volume overload with reduced systolic fx.  Moderate increase pulmonary pressures.   Anticoagulation indicated but Dr Eden EmmsNishan did  Not start due to liver dz and coagulopathy.    *   Dysphagia.  MBSS 6/27: severe pharyngeal dysphagia.  Rec full liquid diet and nectar  thickened liquids by teaspoon, ice chips.  Still at aspiration risk due to post-swallow residues.      *  Non-critical left > right carotid stenosis.  ? Subclavian steal.    *   Hypokalemia, recurrent.   Runs potassium ordered.     *   Baseline severe O2 dependent COPD/Emphysema.      PLAN   *  ? Eventual restart Lasix 40 mg po daily, in combo with Aldactone is preferred drug combo for ascites mgt.    *  Palliative care GOC consult.  Pt full code.  Reluctantly agreeable to SNF/rehab (temoporary)  *  FL and thickened liquid diet as per SLP rec.   *  Given his plethora of medical problems and ongoing Plavix, will start daily Protonix.    *  Going to add prenatal multivitamin/mineral, these contain Vitamin K.  Ordered Nutritional consult.     Jennye Moccasin  07/05/2017, 11:48 AM Phone [phone]    [facility] Attending   I have taken an interval history, reviewed the chart and examined the patient. I agree with the Advanced Practitioner's note, impression and recommendations.   Quite the diuresis - furosemide held  He may be more responsive than I thought he would be  He  is in denial of his prognosis  Headed to a SNF for a while  Would Tx w/ diuretics as advised   If he is here Monday we will see him again  Iva Boop, MD, [facility] 07/05/2017 6:23 PM

## 2017-07-05 NOTE — Consult Note (Signed)
Consultation Note Date: 07/05/2017   Patient Name: Shaun Schmitt  DOB: 1950-08-29  MRN: [id number]  Age / Sex: 67 y.o., male  PCP: Townsend Roger, MD Referring Physician: Allie Bossier, MD  Reason for Consultation: Establishing goals of care and Psychosocial/spiritual support  HPI/Patient Profile: 67 y.o. male  with past medical history of acute liver failure/cirrhosis, COPD, diastolic heart failure with an ejection fraction of 30 to 35%, dysphasia, history of alcohol abuse, coronary artery disease (history of MI x2), hypertension chronic kidney disease, significant peripheral vascular disease , anorexia admitted on 07/02/2017 with 1 week history of shortness of breath.  Patient presented to [facility] and was subsequently transferred to [facility] health system for further treatment of pneumonia, as well as GI services to receive paracentesis.  Patient underwent paracentesis on 07/03/2017 for 2.5 L.  His BNP on admission was 2731.  Per chart review, patient was independent at home prior to admission although he has had an extensive stay in the hospital in the past and has become very weak in the home setting.  Consult ordered for goals of care..   Clinical Assessment and Goals of Care: Met with patient, chart reviewed.  Patient's wife, Reed Pandy, was also available for goals of care meeting.  She shares with me that her husband has always said "do everything you can do to keep me alive".  Patient has had a history of respiratory failure  requiring intubation but was able to come off the ventilator.  She does understand that his heart failure is worsening.  Last year I believe his heart ejection fraction was 40-45 and is now 30 to 35%.  We discussed in length multi-system involvement in terms of his overall health decline.  When we discussed his dysphasia he stated he would not want PEG tube and would like  to eat whatever he would like to eat.  His wife does acknowledge that she does not think he would be successful with a PEG tube and has been struggling even with a modified diet.  We did discuss  aspiration pneumonia and that this is likely going to reoccur  and that overall there is not an underlying cure for dis motility  Patient has short-term memory deficits and does not fully appreciate his health conditions at this point.  He is able to participate to a limited degree in goals of care discussions .  His wife would be his healthcare proxy in the event that he were unable to speak for himself  We also discussed CODE STATUS, defined terms of full code versus DNR    SUMMARY OF RECOMMENDATIONS   Patient remains a full code, full scope of treatment by choice Patient's wife given [employer] Cho[employer] for [employer] booklet as well as MOST form My conversation was taped for the benefit of her children She would like to pursue [facility] at this point for rehab Ongoing palliative care discussions in the community would be helpful.  These services can be accessed either through [facility] at [phone]; or [facility] palliative medicine [facility] at [phone]. Code Status/Advance Care Planning:  Full code    Symptom Management:   Anxiety: Continue with Xanax 0.25 mg twice daily as needed  Continue with Norco 5/325 1 to 2 tablets every 4 hours as needed  Palliative Prophylaxis:   Aspiration, Bowel Regimen, Delirium Protocol, Eye Care, Frequent Pain Assessment, Oral Care and Turn Reposition  Additional Recommendations (Limitations, Scope, Preferences):  Full Scope Treatment  Psycho-social/Spiritual:   Desire for further Chaplaincy support:no  Additional Recommendations: Referral to Community Resources   Prognosis:   Did share with spouse that if things were to continue to go the way they are now, I would not be surprised if he had a  prognosis of less than 6 months and he would meet hospice criteria  Discharge Planning: [place] for rehab with Palliative care service follow-up      Primary Diagnoses: Present on Admission: . Acute liver failure . PVD (peripheral vascular disease) ([place]) . Peripheral vascular disease, unspecified ([place]) . Acute on chronic respiratory failure with hypoxia ([place]) . Chronic respiratory failure with hypoxia ([place]) . CAD (coronary artery disease) . COPD, very severe ([place])   I have reviewed the medical record, interviewed the patient and family, and examined the patient. The following aspects are pertinent.  Past Medical History:  Diagnosis Date  . 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 Arthritis    Gout  . Bronchitis April 23, 2012  . CAD (coronary artery disease) 12/28/2014   Stents x2 on ASA and Plavix  . Chronic respiratory failure with hypoxia ([place]) 02/16/2015  . COPD, very severe ([place]) 12/28/2014  . Elevated LFTs 06/2017  . History of tobacco abuse 02/16/2015  . Hypertension   . Occlusion and stenosis of carotid artery without mention of cerebral infarction 06/25/2013  . Pain in both knees    Right worse , bilateral Hips and ankles, numbness   . Pancytopenia ([place]) 12/28/2014  . Peripheral edema 06/26/2017  . Protein-calorie malnutrition, mild ([place]) 12/28/2014  . Pulmonary nodule/lesion, solitary 12/28/2014   RUL 8 mm nodule on OSH CT chest 11/27/14  . PVD (peripheral vascular disease) ([place]) 09/25/2011  . Renal artery stenosis ([place]) 12/07/2002   renal artery stenting 2004 and found right renal artery stent occlusion on angiography in 2013.    Marland Kitchen Rhinovirus infection 12/30/2014   Social History   Socioeconomic History  . Marital status: Married    Spouse name: Not on file  . Number of children: Not on file  . Years of education: Not on file  . Highest education level: Not on file  Occupational  History  . Not on file  Social Needs  . Financial resource strain: Not on file  . Food insecurity:    Worry: Not on file    Inability: Not on file  . Transportation needs:    Medical: Not on file    Non-medical: Not on file  Tobacco Use  . Smoking status: Former Smoker    Packs/day: 0.25    Years: 40.00    Pack years: 10.00    Types: E-cigarettes, Cigarettes  . Smokeless tobacco: Never Used  . Tobacco comment: QUIT IN 2013  Substance and Sexual Activity  . Alcohol use: Yes    Comment: OCCASIONAL  . Drug use: No  . Sexual activity: Not on file  Lifestyle  . Physical activity:  Days per week: Not on file    Minutes per session: Not on file  . Stress: Not on file  Relationships  . Social connections:    Talks on phone: Not on file    Gets together: Not on file    Attends religious service: Not on file    Active member of club or organization: Not on file    Attends meetings of clubs or organizations: Not on file    Relationship status: Not on file  Other Topics Concern  . Not on file  Social History Narrative  . Not on file   Family History  Problem Relation Age of Onset  . Emphysema Mother   . Hypertension Mother   . Emphysema Father   . Hypertension Father    Scheduled Meds: . aspirin EC  81 mg Oral Daily  . carvedilol  3.125 mg Oral BID WC  . clopidogrel  75 mg Oral Daily  . ipratropium-albuterol  3 mL Nebulization Q4H  . mouth rinse  15 mL Mouth Rinse BID  . mometasone-formoterol  2 puff Inhalation BID  . [START ON 07/06/2017] pantoprazole  40 mg Oral Q0600  . [START ON 2017/07/10] phytonadione  5 mg Oral Once per day on Mon Thu  . prenatal multivitamin  1 tablet Oral Q1200  . sertraline  100 mg Oral Daily  . spironolactone  100 mg Oral Daily   Continuous Infusions: . potassium chloride Stopped (07/05/17 1156)   PRN Meds:.albuterol, ALPRAZolam, bisacodyl, HYDROcodone-acetaminophen, morphine injection, ondansetron **OR** ondansetron (ZOFRAN) IV,  senna-docusate Medications Prior to Admission:  Prior to Admission medications   Medication Sig Start Date End Date Taking? Authorizing Provider  ALPRAZolam (XANAX) 0.25 MG tablet Take 0.125-0.25 mg by mouth See admin instructions. 0.133m in the morning and 0.225min the evening   Yes [provider]  antiseptic oral rinse (BIOTENE) LIQD 15 mLs by Mouth Rinse route as needed for dry mouth. Biotene Mouth Spray   Yes [provider]  aspirin 81 MG tablet Take 81 mg by mouth daily.   Yes [provider]  cetirizine (ZYRTEC) 10 MG tablet Take 10 mg by mouth every evening.   Yes [provider]  cholecalciferol (VITAMIN D) 1000 units tablet Take 1,000 Units by mouth every morning.   Yes [provider]  clopidogrel (PLAVIX) 75 MG tablet Take 75 mg by mouth every evening.    Yes [provider]  Fluticasone-Salmeterol (ADVAIR DISKUS) 250-50 MCG/DOSE AEPB Inhale 1 puff into the lungs every 12 (twelve) hours.    Yes [provider]  furosemide (LASIX) 20 MG tablet Take 20 mg by mouth every morning.  06/12/17  Yes [provider]  guaiFENesin (MUCINEX) 600 MG 12 hr tablet Take 600 mg by mouth 2 (two) times daily.   Yes [provider]  Ipratropium-Albuterol (COMBIVENT RESPIMAT) 20-100 MCG/ACT AERS respimat Inhale 2 puffs into the lungs every 6 (six) hours as needed. For shortness of breath   Yes [provider]  ipratropium-albuterol (DUONEB) 0.5-2.5 (3) MG/3ML SOLN Take 3 mLs by nebulization every 3 (three) hours.   Yes [provider]  Melatonin 3 MG TABS Take 6 mg by mouth at bedtime.   Yes [provider]  metoprolol tartrate (LOPRESSOR) 25 MG tablet Take 12.5 mg by mouth 2 (two) times daily.    Yes [provider]  omeprazole (PRILOSEC) 20 MG capsule Take 20 mg by mouth every morning.   Yes [provider]  pravastatin (PRAVACHOL) 40  MG tablet Take 40 mg by mouth every evening.     Yes [provider]  predniSONE (DELTASONE) 10 MG tablet Take 10 mg by mouth daily with breakfast. 07/02/17  Yes [provider]  Probiotic Product (PROBIOTIC-10 PO) Take 1 capsule by mouth every morning.   Yes [provider]  sertraline (ZOLOFT) 100 MG tablet Take 200 mg by mouth daily.  06/23/12  Yes [provider]  SPIRIVA HANDIHALER 18 MCG inhalation capsule Place 1 capsule into inhaler and inhale every morning.  05/29/17  Yes [provider]  traMADol (ULTRAM) 50 MG tablet Take 1 tablet (50 mg total) by mouth every 6 (six) hours as needed. For pain Patient taking differently: Take 50-150 mg by mouth See admin instructions. 36m scheduled every morning, may have two additional 517mdoses as needed for pain 11/27/11  Yes Rhyne, SaHulen ShoutsPA-C   Allergies  Allergen Reactions  . Codeine Nausea And Vomiting    Upset stomach    Review of Systems  Unable to perform ROS: Mental status change    Physical Exam  Constitutional:  Frail, ill-appearing older man  HENT:  Head: Normocephalic and atraumatic.  Neck: Normal range of motion.  Cardiovascular: Normal rate.  Pulmonary/Chest: Effort normal.  Abdominal: He exhibits distension.  Neurological: He is alert.  Oriented to himself and that he is in the hospital Term memory deficits noted Very limited insight  Skin: Skin is warm and dry.  Nursing note and vitals reviewed.   Vital Signs: BP 127/75   Pulse 60   Temp 97.6 F (36.4 C) (Oral)   Resp 13   Ht _0  (1.854 m)   Wt 59.2 kg (130 lb 8.2 oz)   SpO2 95%   BMI 17.22 kg/m  Pain Scale: 0-10 POSS *See Group Information*: 1-Acceptable,Awake and alert Pain Score: 0-No pain   SpO2: SpO2: 95 % O2 Device:SpO2: 95 % O2 Flow Rate: .O2 Flow Rate (L/min): 4 L/min  IO: Intake/output summary:   Intake/Output Summary (Last 24 hours) at 07/05/2017 1252 Last data filed at 07/05/2017 [id number] per 24 hour  Intake -  Output 1400 ml  Net  -1400 ml    LBM: Last BM Date: 07/02/17 Baseline Weight: Weight: 68.2 kg (150 lb 4.8 oz) Most recent weight: Weight: 59.2 kg (130 lb 8.2 oz)     Palliative Assessment/Data:   Flowsheet Rows     Most Recent Value  Intake Tab  Referral Department  Hospitalist  Unit at Time of Referral  Intermediate Care Unit  Palliative Care Primary Diagnosis  Sepsis/Infectious Disease  Date Notified  07/04/17  Palliative Care Type  New Palliative care  Reason for referral  Clarify Goals of Care  Date of Admission  07/02/17  Date first seen by Palliative Care  07/05/17  # of days Palliative referral response time  1 Day(s)  # of days IP prior to Palliative referral  2  Clinical Assessment  Palliative Performance Scale Score  40%  Pain Max last 24 hours  Not able to report  Pain Min Last 24 hours  Not able to report  Dyspnea Max Last 24 Hours  Not able to report  Dyspnea Min Last 24 hours  Not able to report  Nausea Max Last 24 Hours  Not able to report  Nausea Min Last 24 Hours  Not able to report  Anxiety Max Last 24 Hours  Not able to report  Anxiety Min Last 24 Hours  Not able to report  Other Max Last 24 Hours  Not able to report  Psychosocial & Spiritual Assessment  Palliative Care Outcomes  Patient/Family meeting held?  Yes  Who was at the meeting?  pt and wife  Palliative Care follow-up planned  No      Time In: 1100 Time Out: 1210 Time Total: 70 min Greater than 50%  of this time was spent counseling and coordinating care related to the above assessment and plan. Staffed with Dr. Sherral Hammers  Signed by: Dory Horn, NP   Please contact Palliative Medicine Team phone at [phone] for questions and concerns.  For individual provider: See Shea Evans

## 2017-07-05 NOTE — Progress Notes (Signed)
Initial Nutrition Assessment  DOCUMENTATION CODES:   Severe malnutrition in context of chronic illness, Underweight  INTERVENTION:   -MVI with minerals daily -Magic Cup TID with meals, each supplement provides 290 kcals and 9 grams protein -Hormel Shake TID with meals, each supplement provides 520 kcals and 22 grams protein -Snacks TID with meals -If pt continues to desires aggressive care, consider initiation of nutrition support. Recommend:  Initiate Osmolite 1.5 @ 20 ml/hr and increase by 10 ml every 12 hours to goal rate of 60 ml/hr.   30 ml Prostat BID.    Tube feeding regimen provides 2360 kcal (100% of needs), 120 grams of protein, and 1097 ml of H2O.  -Recommend monitor K, Mg, and Phos daily x 3 days and replete as needed related to very high refeeding risk  NUTRITION DIAGNOSIS:   Severe Malnutrition related to chronic illness(COPD) as evidenced by energy intake < or equal to 75% for > or equal to 1 month, severe fat depletion, severe muscle depletion.  GOAL:   Patient will meet greater than or equal to 90% of their needs  MONITOR:   PO intake, Supplement acceptance, Diet advancement, Labs, Weight trends, Skin, I & O's  REASON FOR ASSESSMENT:   Low [place], Consult Assessment of nutrition requirement/status, Poor PO  ASSESSMENT:   Shaun Schmitt is a 67 y.o. male with a Past Medical History of remote tobacco dependence; remote ETOH dependence; RAS s/p stents; ureteral stenosis; PVD; pancytopenia; carotid stenosis; CAD; HTN; and severe COPD on 3.5L home O2 who presents with fatigue x several weeks with malaise and SOB.  Pt admitted with acute liver failure with ascites. Probable cirrhosis per GI.   6/26- s/p paracentesis- 2.5 L removed; pt NPO due to coughing on liquids 6/27- s/p BSE- NPO; s/p MBSS- nectar thick full liquids  Palliative care has been consulted.   Case discussed with RN prior to visit, who reports pt is very frustrated over diet restrictions.  Dr. Joseph ArtWoods (attending) has been in with pt and allowed pt to have smoothie with thickener; pt and wife are having discussions regarding goals of care and how they want to proceed.   Spoke with pt and wife at bedside. Per pt wife, pt has had very poor oral intake over the past month related to ascites. Pt would consume mainly bites and sips at meals related to early satiety. Pt wife endorses that the majority of pt's intake consisted of a cup of yogurt in the morning (to take his medications) and 2-3 Premier Protein shakes per day. Overall, wife described that pt is consuming about 75% less than what he usually does.   Pt wife endorses that pt has lost "a lot" of weight over the past several months. She shares that some of the weight is fluid-related and it is difficult to quantify how much wt pt has actually lost.   Discussed with pt and wife speech therapy recommendations for full, nectar thick liquids- reviewed menu items that were complaint with pt diet. Verbalized concern that pt will likely be unable to meet his nutritional needs with current diet long term. Offered Soil scientistHormel Shake and Magic Cup supplements on trays for increased variability and optimizing PO intake; pt eager to try.    Labs reviewed: K: 2.9.   NUTRITION - FOCUSED PHYSICAL EXAM:    Most Recent Value  Orbital Region  Severe depletion  Upper Arm Region  Severe depletion  Thoracic and Lumbar Region  Severe depletion  Buccal Region  Severe depletion  Temple Region  Severe depletion  Clavicle Bone Region  Severe depletion  Clavicle and Acromion Bone Region  Severe depletion  Scapular Bone Region  Severe depletion  Dorsal Hand  Severe depletion  Patellar Region  Severe depletion  Anterior Thigh Region  Severe depletion  Posterior Calf Region  Severe depletion  Edema (RD Assessment)  None  Hair  Reviewed  Eyes  Reviewed  Mouth  Reviewed  Skin  Reviewed  Nails  Reviewed       Diet Order:   Diet Order           Diet  full liquid Room service appropriate? Yes; Fluid consistency: Nectar Thick  Diet effective now          EDUCATION NEEDS:   Education needs have been addressed  Skin:  Skin Assessment: Skin Integrity Issues: Skin Integrity Issues:: Stage I Stage I: sacrum  Last BM:  07/02/17  Height:   Ht Readings from Last 1 Encounters:  07/02/17 6\' 1"  (1.854 m)    Weight:   Wt Readings from Last 1 Encounters:  07/05/17 130 lb 8.2 oz (59.2 kg)    Ideal Body Weight:  83.6 kg  BMI:  Body mass index is 17.22 kg/m.  Estimated Nutritional Needs:   Kcal:  2150-2350  Protein:  115-130 grams  Fluid:  >2.1 L    Melainie Krinsky A. Mayford Knife, RD, LDN, CDE Pager: [phone] After hours Pager: [phone]

## 2017-07-06 DIAGNOSIS — I2510 Atherosclerotic heart disease of native coronary artery without angina pectoris: Secondary | ICD-10-CM

## 2017-07-06 LAB — CBC
HEMATOCRIT: 35.4 % — AB (ref 39.0–52.0)
Hemoglobin: 9.9 g/dL — ABNORMAL LOW (ref 13.0–17.0)
MCH: 22.5 pg — ABNORMAL LOW (ref 26.0–34.0)
MCHC: 28 g/dL — ABNORMAL LOW (ref 30.0–36.0)
MCV: 80.5 fL (ref 78.0–100.0)
PLATELETS: 74 10*3/uL — AB (ref 150–400)
RBC: 4.4 MIL/uL (ref 4.22–5.81)
RDW: 18.6 % — ABNORMAL HIGH (ref 11.5–15.5)
WBC: 6.6 10*3/uL (ref 4.0–10.5)

## 2017-07-06 LAB — BASIC METABOLIC PANEL
ANION GAP: 8 (ref 5–15)
BUN: 20 mg/dL (ref 8–23)
CALCIUM: 8.2 mg/dL — AB (ref 8.9–10.3)
CHLORIDE: 97 mmol/L — AB (ref 98–111)
CO2: 38 mmol/L — AB (ref 22–32)
Creatinine, Ser: 1.08 mg/dL (ref 0.61–1.24)
GFR calc non Af Amer: >60 mL/min (ref >60)
Glucose, Bld: 103 mg/dL — ABNORMAL HIGH (ref 70–99)
Potassium: 3.6 mmol/L (ref 3.5–5.1)
Sodium: 143 mmol/L (ref 135–145)

## 2017-07-06 LAB — AMMONIA: Ammonia: 27 umol/L (ref 9–35)

## 2017-07-06 LAB — PROTIME-INR
INR: 1.44
PROTHROMBIN TIME: 17.4 s — AB (ref 11.4–15.2)

## 2017-07-06 LAB — MAGNESIUM: Magnesium: 1.8 mg/dL (ref 1.7–2.4)

## 2017-07-06 NOTE — Progress Notes (Addendum)
PROGRESS NOTE    Shaun Schmitt  AOZ:[id number] DOB: May 03, 1950 DOA: 07/02/2017 PCP: Crist Fat, MD   Brief Narrative:  67 y.o. male PMHx COPD on home oxygen 3.5 L,, Emphysema,Tobacco abuse,, CHF, HTN, CAD S/P stents x 2, MI x2, renal artery stenosis, S/P RIGHT renal artery stent, EtOH abuse. Presented to [facility] with shortness of breath for about 1 week, worse on exertion, generalized weakness, cough, without fever or chills. At [facility] ER, the patient was tachypneic, and had intermittent periods of bradycardia and tachycardia, and anxious.   Subjective: Patient feels well.  Denies any complaints today.  Waiting on bed availability at skilled nursing facility.        Assessment & Plan:   Acute liver failure with ascites/ MELD score= 27, 19.6% 39-month mortality - Suspected to be due to remote Hx EtOH abuse versus right heart failure.  Underwent extensive work-up directed by gastroenterology. -6/26 IR paracentesis: 2.5 L fluid aspirated.  No SBP. -Holding all Tylenol products -Acute hepatitis panel negative -Patient was followed by gastroenterology - Echocardiogram consistent with systolic and diastolic CHF, pulmonary HTN.  See results below - Ammonia level has been normal  Coagulopathy -Secondary to liver failure -Normalized with vitamin K 5 mg x 3 doses - Vitamin K 5 mg 2 x /wk   COPD - On 3.5 L O2 at home.  Currently on 4 L O2 via [place] with SPO2 91% - CXR positive for emphysema see results below - RIGHT lung lucency?  Artifact vs lung nodule.  CT scan shows significant emphysema.  Small pleural effusions noted.  Postinflammatory scarring also noted.  But nothing concerning was identified otherwise.  -Duoneb q 4hr -Dulera 100-5 mcg 2 puff BID  Infrarenal 3.2 cm abdominal aortic aneurysm.  -No bleeding issues -Will need to follow with Vascular surgery as outpatient with repeat CT in 6 months     Acute systolic and diastolic CHF (baseline weight  unknown) -Strict in and out since admission negative -7.1 L -Echocardiogram consistent with systolic and diastolic CHF see results below.  Does not appear that patient has seen cardiologist since. -Cardiology was consulted.  He appears to be quite stable from their standpoint.  No further work-up needed at this time.  Continue medical management.   - 6/27 Coreg 3.125 mg BID -Spironolactone 100 mg daily: Per GI fluid management Furosemide was discontinued.  Possible atrial fibrillation A. fib was apparently seen on EKG.  Telemetry however shown sinus rhythm.  Few beats of possible A. fib noted.  See cardiology notes.  Continue beta-blocker.  No anticoagulation due to acute liver failure as well as coagulopathy.    Pulmonary HTN -See CHF  Essential HTN -Blood pressure stable  CAD S/P MI 2012/DES - EKG sinus rhythm, with occasional PVCs, incomplete right bundle branch block, right ventricular hypertrophy, QTC 459. Tn 0.14  .Denies CP - Troponins remain elevated but stable.  Patient asymptomatic. -Plavix 75 mg daily  -Aspirin on hold secondary acute liver failure    Normocytic anemia and thrombocytopenia Hemoglobin is stable.  Low platelet counts are due to liver disease.  No evidence for acute bleeding.  Continue to monitor.  Acute kidney injury -Most likely secondary to dehydration -Monitor closely -resolved  Hyperlipidemia -  Lipid panel within ADA guidelines   Depression - Zoloft 100 mg daily   Hypokalemia Potassium has been repleted.  Magnesium 1.8.  Dysphasia - RN staff witnessed what appeared to be several episodes of aspiration.   - 6/28 patient failed  swallow evaluation: Fluid consistency nectar thick.  Will need to be followed at the skilled nursing facility.  Goals of care Seen by palliative medicine.  Patient remains full code for now.  DVT prophylaxis: SCD Code Status: Full Family Communication: None Disposition Plan: Anticipate discharge to skilled nursing  facility when bed is available.   Consultants:  GI Cardiology Palliative care    Procedures/Significant Events:  CT Abdomen and Pelvis w/o contrast at outside hospital:-Remarkable for  and morphologic changes of cirrhosis with moderate to large volume of ascites.  Ultrasound of the abdomen outside hospital:-moderate ascites, predominantly perihepatic, mildly echogenic liver parenchyma, likely due to steatosis and or fibrosis, no definite liver surface irregularity or masses. 6/25 PCXR:Severe emphysema with areas of subpleural scarring. 2. Right lung lucency most likely artifactual. CT may provide better evaluation if clinically indicated. No interval change since the earlier radiograph. 6/26 IR paracentesis: 2.5 L of yellow fluid aspirated 6/26 echocardiogram:- Left ventricle: LVEF = 35% to 40%.-Grade 1 diastolic dysfunction. - Ventricular septum: Septal motion showed abnormal function and  dyssynchrony consistent with bundle branch block.  -Right ventricle:  severely dilated.-Tricuspid valve: moderate regurgitation. - Pulmonary arteries:  PA peak pressure: 53 mm Hg (S).  CT Chest IMPRESSION: 1. Severe centrilobular emphysema with diffuse bronchial wall thickening, suggesting COPD. No pneumothorax. 2. Small dependent bilateral pleural effusions with mild dependent atelectasis in the lower lobes, right greater than left. 3. Severe postinfectious/postinflammatory scarring in the left upper lobe. 4. No acute consolidative airspace disease, lung masses or significant pulmonary nodules. 5. Small volume upper abdominal ascites. 6. Left main and 3 vessel coronary atherosclerosis.    Cultures 6/25 acute hepatitis panel negative 6/25 HIV negative  Antimicrobials: None  Objective: Vitals:   07/05/17 2137 07/06/17 0437 07/06/17 0631 07/06/17 0832  BP: 126/63 117/63 107/81   Pulse: 68 (!) 113 88   Resp: 18 18    Temp: 98.1 F (36.7 C) 98.7 F (37.1 C)    TempSrc:  Oral     SpO2: 94% 98%  93%  Weight:  59.4 kg (131 lb)    Height:        Intake/Output Summary (Last 24 hours) at 07/06/2017 1309 Last data filed at 07/06/2017 0955 Gross per 24 hour  Intake 260 ml  Output 100 ml  Net 160 ml   Filed Weights   07/05/17 0500 07/05/17 1700 07/06/17 0437  Weight: 59.2 kg (130 lb 8.2 oz) 59.1 kg (130 lb 4.7 oz) 59.4 kg (131 lb)    Physical Exam:  General: Awake alert.  In no distress. Lungs: Clear to auscultation bilaterally.  No wheezing or rhonchi. Cardiovascular: S1-S2 is normal regular.  No S3-S4.  No rubs murmurs or bruit Abdomen: Abdomen is soft.  Nontender nondistended.  Bowel sounds are present.  No masses organomegaly  Extremities: Minimal edema Central nervous system: Alert and oriented x3.  No facial asymmetry.  Motor strength equal bilateral upper and lower extremities.      CBC: Recent Labs  Lab 07/02/17 0415 07/03/17 0414 07/04/17 0401 07/05/17 0451 07/06/17 0532  WBC 9.0 12.4* 9.0 7.1 6.6  NEUTROABS 8.4*  --   --   --   --   HGB 10.9* 10.3* 9.9* 9.8* 9.9*  HCT 37.4* 35.4* 34.3* 34.8* 35.4*  MCV 79.1 78.8 80.0 80.2 80.5  PLT 107* 105* 84* 74* 74*   Basic Metabolic Panel: Recent Labs  Lab 07/02/17 0415 07/03/17 0414 07/04/17 0401 07/05/17 0451 07/06/17 0532  NA 141 141 143 145 143  K 4.0 3.0* 3.6 2.9* 3.6  CL 100 99 99 96* 97*  CO2 28 32 36* 39* 38*  GLUCOSE 129* 94 85 86 103*  BUN 44* 44* 30* 19 20  CREATININE 1.92* 1.51* 1.16 1.01 1.08  CALCIUM 8.7* 8.3* 8.1* 8.5* 8.2*  MG  --   --  1.8 1.9 1.8   GFR: Estimated Creatinine Clearance: 58.1 mL/min (by C-G formula based on SCr of 1.08 mg/dL).  Liver Function Tests: Recent Labs  Lab 07/02/17 0415 07/03/17 0414  AST 1,093* 517*  ALT 637* 546*  ALKPHOS 165* 147*  BILITOT 3.0* 2.4*  PROT 6.0* 5.9*  ALBUMIN 3.0* 2.8*    Recent Labs  Lab 07/02/17 1403 07/04/17 0401 07/05/17 0451 07/06/17 0532  AMMONIA 30 12 20 27    Coagulation Profile: Recent Labs  Lab  07/02/17 0415 07/03/17 0414 07/04/17 0401 07/05/17 0451 07/06/17 0532  INR 2.41 1.84 1.60 1.42 1.44   Cardiac Enzymes: Recent Labs  Lab 07/02/17 0415 07/02/17 0843 07/02/17 1403  TROPONINI 0.17* 0.16* 0.16*   Lipid Profile: Recent Labs    07/04/17 0401  CHOL 109  HDL 27*  LDLCALC 64  TRIG 89  CHOLHDL 4.0      Radiology Studies: Ct Chest Wo Contrast  Result Date: 07/05/2017 CLINICAL DATA:  Inpatient. COPD. Indeterminate right lung lucency on recent chest radiograph. EXAM: CT CHEST WITHOUT CONTRAST TECHNIQUE: Multidetector CT imaging of the chest was performed following the standard protocol without IV contrast. COMPARISON:  07/02/2017 chest radiograph.  12/08/2014 chest CT. FINDINGS: Cardiovascular: Normal heart size. Small pericardial effusion/thickening. Left main and 3 vessel coronary atherosclerosis. Atherosclerotic nonaneurysmal thoracic aorta. Stable top-normal main pulmonary artery. Mediastinum/Nodes: No discrete thyroid nodules. Unremarkable esophagus. No pathologically enlarged axillary, mediastinal or hilar lymph nodes, noting limited sensitivity for the detection of hilar adenopathy on this noncontrast study. Lungs/Pleura: No pneumothorax. Small dependent bilateral pleural effusions, right greater than left. Mild dependent atelectasis in the right greater than left lower lobes. Severe centrilobular emphysema with diffuse bronchial wall thickening. Thick parenchymal band with associated prominent volume loss, bronchiectasis and distortion in the left upper lobe, compatible with postinfectious/postinflammatory scarring. No acute consolidative airspace disease, lung masses or significant pulmonary nodules. Upper abdomen: Small volume upper abdominal ascites, predominantly perihepatic. Musculoskeletal: No aggressive appearing focal osseous lesions. Moderate thoracic spondylosis. IMPRESSION: 1. Severe centrilobular emphysema with diffuse bronchial wall thickening, suggesting  COPD. No pneumothorax. 2. Small dependent bilateral pleural effusions with mild dependent atelectasis in the lower lobes, right greater than left. 3. Severe postinfectious/postinflammatory scarring in the left upper lobe. 4. No acute consolidative airspace disease, lung masses or significant pulmonary nodules. 5. Small volume upper abdominal ascites. 6. Left main and 3 vessel coronary atherosclerosis. Aortic Atherosclerosis (ICD10-I70.0) and Emphysema (ICD10-J43.9). Electronically Signed   By: Delbert Phenix M.D.   On: 07/05/2017 21:07   Dg Swallowing Func-speech Pathology  Result Date: 07/04/2017 Objective Swallowing Evaluation: Type of Study: MBS-Modified Barium Swallow Study  Patient Details Name: JADIAN KARMAN MRN: [id number] Date of Birth: [birth year] Today's Date: 07/04/2017 Time: SLP Start Time (ACUTE ONLY): 1330 -SLP Stop Time (ACUTE ONLY): 1400 SLP Time Calculation (min) (ACUTE ONLY): 30 min Past Medical History: Past Medical History: Diagnosis Date . 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 Arthritis   Gout . Bronchitis April 23, 2012 . CAD (coronary artery disease) 12/28/2014  Stents x2 on ASA and Plavix . Chronic respiratory failure with hypoxia (HCC) 02/16/2015 . COPD, very severe (  HCC) 12/28/2014 . Elevated LFTs 06/2017 . History of tobacco abuse 02/16/2015 . Hypertension  . Occlusion and stenosis of carotid artery without mention of cerebral infarction 06/25/2013 . Pain in both knees   Right worse , bilateral Hips and ankles, numbness  . Pancytopenia (HCC) 12/28/2014 . Peripheral edema 06/26/2017 . Protein-calorie malnutrition, mild (HCC) 12/28/2014 . Pulmonary nodule/lesion, solitary 12/28/2014  RUL 8 mm nodule on OSH CT chest 11/27/14 . PVD (peripheral vascular disease) (HCC) 09/25/2011 . Renal artery stenosis (HCC) 12/07/2002  renal artery stenting 2004 and found right renal artery stent occlusion on angiography in 2013.   Marland Kitchen Rhinovirus  infection 12/30/2014 Past Surgical History: Past Surgical History: Procedure Laterality Date . ABDOMINAL AORTAGRAM N/A 10/31/2011  Procedure: ABDOMINAL AORTAGRAM;  Surgeon: Larina Earthly, MD;  Location: [facility] CATH LAB;  Service: Cardiovascular;  Laterality: N/A; . CARDIAC CATHETERIZATION    cath 05/21/00 ([facility]) showed 50% ostial OM, 25-30% mRCA, EF 40-45%) . IR PARACENTESIS  07/03/2017 . JOINT REPLACEMENT  2005  Right Hip . KNEE SURGERY   . PATCH ANGIOPLASTY  11/26/2011  Procedure: PATCH ANGIOPLASTY;  Surgeon: Larina Earthly, MD;  Location: [facility] OR;  Service: Vascular;  Laterality: Right;  Using Hemashield 0.8cm x 7.6 cm vascular patch. . PR VEIN BYPASS GRAFT,AORTO-FEM-POP   . REPAIR ILIAC ARTERY  11/26/2011  Procedure: REPAIR ILIAC ARTERY;  Surgeon: Larina Earthly, MD;  Location: [facility] OR;  Service: Vascular;  Laterality: Right;  With patch angioplasty. HPI: Pt is a 67 y.o. male who presented to [facility] with SOB x1 week, found to have acute liver failure with ascites. Pt was noted to cough after drinking liquids, therefore he was made NPO and swallow evaluation was ordered. PMH: COPD on home oxygen 3.5 L, Emphysema, Tobacco abuse, CHF, HTN, CAD S/P stents x 2, MI x2, renal artery stenosis, S/P RIGHT renal artery stent, EtOH abuse. Pt had previous swallow testing at [facility] in 2016 after intubation, advanced to Dys 3 diet and nectar thick liquids with a chin tuck. ENT assessed for possible vocal cord injection due to concern for paralysis but they did not feel this was necessary on an inpatient basis.  Subjective: pt describes trouble swallowing previously, has had PNA, but has trouble recalling specific details Assessment / Plan / Recommendation CHL IP CLINICAL IMPRESSIONS 07/04/2017 Clinical Impression Pt presenting with a moderate oral, severe pharyngeal dysphagia, suspect chronic. Oral phase characterized by piecemeal swallowing and delayed oral transit along with premature spillage of liquid consistencies;  pharyngeal phase showed decreased anterior laryngeal movement/ reduced epiglottic deflection and reduced airway closure resulting in frank, silent aspiration of thin liquids before the swallow, penetration of nectar thick liquids before swallow and aspiration post-swallow which appeared to be from vallecular residuals. Cued cough was ineffective in clearing aspirated material. Pt with persistent moderate residuals in the vallecula and pyriform sinuses; when cued to swallow a 2nd time pt reported he did not have enough saliva to swallow. Pt did tolerate nectar-thick liquids by teaspoon with only flash penetration which cleared some of the puree material residuals. Chin tuck and head turn were attempted but unfortunately did not appear effective in prevention of material entering airway or residuals. For now, recommend full liquid diet with liquids thickened to nectar by teaspoon only. Will appreciate palliative consult as it seems pt does not desire altered diet; states "I think I'm nearing the end and this isn't how I wanted it to go" and has not been compliant with thickened liquids in the past. May  be a good candidate for [employer] free water protocol. Will continue to follow briefly for diet tolerance/ education.  SLP Visit Diagnosis Dysphagia, oropharyngeal phase (R13.12) Attention and concentration deficit following -- Frontal lobe and executive function deficit following -- Impact on safety and function Severe aspiration risk   CHL IP TREATMENT RECOMMENDATION 07/04/2017 Treatment Recommendations Therapy as outlined in treatment plan below   Prognosis 07/04/2017 Prognosis for Safe Diet Advancement Guarded Barriers to Reach Goals Time post onset;Severity of deficits Barriers/Prognosis Comment -- CHL IP DIET RECOMMENDATION 07/04/2017 SLP Diet Recommendations Nectar thick liquid;Other (Comment) Liquid Administration via Spoon Medication Administration Crushed with puree Compensations Slow rate;Small sips/bites Postural  Changes Remain semi-upright after after feeds/meals (Comment);Seated upright at 90 degrees   CHL IP OTHER RECOMMENDATIONS 07/04/2017 Recommended Consults (No Data) Oral Care Recommendations Oral care before and after PO Other Recommendations Clarify dietary restrictions   CHL IP FOLLOW UP RECOMMENDATIONS 07/04/2017 Follow up Recommendations Other (comment)   CHL IP FREQUENCY AND DURATION 07/04/2017 Speech Therapy Frequency (ACUTE ONLY) min 1 x/week Treatment Duration 1 week      CHL IP ORAL PHASE 07/04/2017 Oral Phase Impaired Oral - Pudding Teaspoon -- Oral - Pudding Cup -- Oral - Honey Teaspoon -- Oral - Honey Cup -- Oral - Nectar Teaspoon Delayed oral transit;Premature spillage Oral - Nectar Cup Piecemeal swallowing;Delayed oral transit;Decreased bolus cohesion;Premature spillage Oral - Nectar Straw -- Oral - Thin Teaspoon -- Oral - Thin Cup Piecemeal swallowing;Delayed oral transit;Decreased bolus cohesion;Premature spillage Oral - Thin Straw -- Oral - Puree Premature spillage Oral - Mech Soft -- Oral - Regular -- Oral - Multi-Consistency -- Oral - Pill -- Oral Phase - Comment --  CHL IP PHARYNGEAL PHASE 07/04/2017 Pharyngeal Phase Impaired Pharyngeal- Pudding Teaspoon -- Pharyngeal -- Pharyngeal- Pudding Cup -- Pharyngeal -- Pharyngeal- Honey Teaspoon -- Pharyngeal -- Pharyngeal- Honey Cup -- Pharyngeal -- Pharyngeal- Nectar Teaspoon Delayed swallow initiation-pyriform sinuses;Reduced anterior laryngeal mobility;Reduced airway/laryngeal closure;Penetration/Aspiration during swallow;Pharyngeal residue - valleculae Pharyngeal Material enters airway, remains ABOVE vocal cords then ejected out Pharyngeal- Nectar Cup Delayed swallow initiation-pyriform sinuses;Reduced anterior laryngeal mobility;Reduced airway/laryngeal closure;Penetration/Apiration after swallow;Pharyngeal residue - valleculae;Pharyngeal residue - pyriform Pharyngeal Material enters airway, passes BELOW cords without attempt by patient to eject out  (silent aspiration) Pharyngeal- Nectar Straw -- Pharyngeal -- Pharyngeal- Thin Teaspoon -- Pharyngeal -- Pharyngeal- Thin Cup Reduced anterior laryngeal mobility;Reduced airway/laryngeal closure;Penetration/Aspiration before swallow;Pharyngeal residue - valleculae;Pharyngeal residue - pyriform Pharyngeal Material enters airway, passes BELOW cords without attempt by patient to eject out (silent aspiration) Pharyngeal- Thin Straw -- Pharyngeal -- Pharyngeal- Puree Reduced epiglottic inversion;Reduced anterior laryngeal mobility;Pharyngeal residue - valleculae;Pharyngeal residue - pyriform Pharyngeal -- Pharyngeal- Mechanical Soft -- Pharyngeal -- Pharyngeal- Regular -- Pharyngeal -- Pharyngeal- Multi-consistency -- Pharyngeal -- Pharyngeal- Pill -- Pharyngeal -- Pharyngeal Comment --  CHL IP CERVICAL ESOPHAGEAL PHASE 07/04/2017 Cervical Esophageal Phase WFL Pudding Teaspoon -- Pudding Cup -- Honey Teaspoon -- Honey Cup -- Nectar Teaspoon -- Nectar Cup -- Nectar Straw -- Thin Teaspoon -- Thin Cup -- Thin Straw -- Puree -- Mechanical Soft -- Regular -- Multi-consistency -- Pill -- Cervical Esophageal Comment -- No flowsheet data found. Amy Cecille AverK [place], MA, CCC-SLP 07/04/2017, 3:04 PM [id number]                   Scheduled Meds: . aspirin EC  81 mg Oral Daily  . carvedilol  3.125 mg Oral BID WC  . clopidogrel  75 mg Oral Daily  . ipratropium-albuterol  3 mL Nebulization TID  .  mouth rinse  15 mL Mouth Rinse BID  . mometasone-formoterol  2 puff Inhalation BID  . pantoprazole  40 mg Oral Q0600  . [START ON 07/31/17] phytonadione  5 mg Oral Once per day on Mon Thu  . prenatal multivitamin  1 tablet Oral Q1200  . sertraline  100 mg Oral Daily  . spironolactone  100 mg Oral Daily   Continuous Infusions:   LOS: 4 days     Osvaldo Shipper, MD [facility] Pager [phone]   If 7PM-7AM, please contact night-coverage [email or web address] Password [email or web address] 07/06/2017, 1:09 PM

## 2017-07-06 NOTE — Progress Notes (Signed)
Dr Ricki MillerNishans rounding note reviewed from Friday, no additional cardiology recommendations at this time, call over the weekend with questions.   Dina RichJonathan Zaniah [place] MD

## 2017-07-07 LAB — BASIC METABOLIC PANEL
ANION GAP: 10 (ref 5–15)
BUN: 18 mg/dL (ref 8–23)
CALCIUM: 8.4 mg/dL — AB (ref 8.9–10.3)
CO2: 35 mmol/L — ABNORMAL HIGH (ref 22–32)
CREATININE: 1.1 mg/dL (ref 0.61–1.24)
Chloride: 96 mmol/L — ABNORMAL LOW (ref 98–111)
GFR calc Af Amer: >60 mL/min (ref >60)
GLUCOSE: 93 mg/dL (ref 70–99)
Potassium: 3.4 mmol/L — ABNORMAL LOW (ref 3.5–5.1)
Sodium: 141 mmol/L (ref 135–145)

## 2017-07-07 MED ORDER — SPIRONOLACTONE 100 MG PO TABS: 100.0000 mg | ORAL_TABLET | Freq: Every day | ORAL | Status: AC

## 2017-07-07 MED ORDER — PREDNISOLONE 5 MG PO TABS
60.0000 mg | ORAL_TABLET | Freq: Every day | ORAL | Status: DC
  Filled 2017-07-07: qty 12

## 2017-07-07 MED ORDER — PRENATAL MULTIVITAMIN CH: 1.0000 | ORAL_TABLET | Freq: Every day | ORAL | Status: AC

## 2017-07-07 MED ORDER — CARVEDILOL 3.125 MG PO TABS: 3.1250 mg | ORAL_TABLET | Freq: Two times a day (BID) | ORAL | Status: AC

## 2017-07-07 MED ORDER — PREDNISONE 50 MG PO TABS
60.0000 mg | ORAL_TABLET | Freq: Every day | ORAL | Status: DC
  Administered 2017-07-08: 60 mg via ORAL
  Filled 2017-07-07: qty 1

## 2017-07-07 MED ORDER — HYDROCODONE-ACETAMINOPHEN 5-325 MG PO TABS: 1.0000 | ORAL_TABLET | ORAL | 0 refills | Status: AC | PRN

## 2017-07-07 MED ORDER — PREDNISONE 10 MG PO TABS: ORAL_TABLET | ORAL | Status: AC

## 2017-07-07 MED ORDER — PANTOPRAZOLE SODIUM 40 MG PO TBEC: 40.0000 mg | DELAYED_RELEASE_TABLET | Freq: Every day | ORAL | Status: AC

## 2017-07-07 MED ORDER — IPRATROPIUM-ALBUTEROL 0.5-2.5 (3) MG/3ML IN SOLN: 3.0000 mL | RESPIRATORY_TRACT | Status: AC | PRN

## 2017-07-07 MED ORDER — PHYTONADIONE 5 MG PO TABS: 5.0000 mg | ORAL_TABLET | ORAL | Status: AC

## 2017-07-07 MED ORDER — ALPRAZOLAM 0.25 MG PO TABS: 0.2500 mg | ORAL_TABLET | Freq: Two times a day (BID) | ORAL | 0 refills | Status: AC | PRN

## 2017-07-07 MED ORDER — SENNOSIDES-DOCUSATE SODIUM 8.6-50 MG PO TABS: 1.0000 | ORAL_TABLET | Freq: Every evening | ORAL | Status: AC | PRN

## 2017-07-07 MED ORDER — POTASSIUM CHLORIDE CRYS ER 20 MEQ PO TBCR
40.0000 meq | EXTENDED_RELEASE_TABLET | Freq: Once | ORAL | Status: AC
  Administered 2017-07-07: 40 meq via ORAL
  Filled 2017-07-07: qty 2

## 2017-07-07 NOTE — Discharge Summary (Signed)
[facility]  Physician Discharge Summary   Patient ID: Shaun Schmitt MRN: [id number] DOB/AGE: Jun 04, 1950 67 y.o.  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 Check CBC, PT/INR, ammonia level and complete metabolic panel on a weekly basis 2. Patient will need follow-up by speech therapy for his dysphagia 3. Can follow-up with gastroenterology in [place] or with Dr. Leone Payor with [facility]. 4. Please note patient is on prednisone on a chronic basis for COPD.  He is currently getting stress dosing with quick taper  DISCHARGE CONDITION: fair  Diet recommendation: Full liquid diet with nectar thick liquids  Filed Weights   07/05/17 0500 07/05/17 1700 07/06/17 0437  Weight: 59.2 kg (130 lb 8.2 oz) 59.1 kg (130 lb 4.7 oz) 59.4 kg (131 lb)    INITIAL HISTORY: 66 y.o.malePMHx COPD on home oxygen 3.5 L,, Emphysema,Tobacco abuse,, CHF, HTN, CAD S/P stents x 2, MI x2, renal artery stenosis, S/P RIGHT renal artery stent, EtOH abuse. Presented to [facility] with shortness of breath for about 1 week, worse on exertion, generalized weakness, cough, without fever or chills.At [facility] ER, the patient was tachypneic, and had intermittent periods of bradycardia and tachycardia, and anxious.  Consultants:  GI Cardiology Palliative care   Procedures/Significant Events:  CT Abdomen and Pelvis w/o contrast at outside hospital:-Remarkable for and morphologic changes of cirrhosis with moderate to large volume of ascites. Ultrasound of the abdomen outside hospital:-moderate ascites, predominantly perihepatic, mildly echogenic liver parenchyma, likely due to steatosis and or fibrosis, no definite liver  surface irregularity or masses. 6/25 PCXR:Severe emphysema with areas of subpleural scarring. 2. Right lung lucency most likely artifactual. CT may provide better evaluation if clinically indicated. No interval change since the earlier radiograph. 6/26 IR paracentesis: 2.5 L of yellow fluid aspirated 6/26 echocardiogram:- Left ventricle: LVEF = 35% to 40%.-Grade 1 diastolic dysfunction. - Ventricular septum: Septal motion showed abnormal function and dyssynchrony consistent with bundle branch block.  -Right ventricle:  severely dilated.-Tricuspid valve: moderate regurgitation. - Pulmonary arteries: PA peak pressure: 53 mm Hg (S).  CT Chest IMPRESSION: 1. Severe centrilobular emphysema with diffuse bronchial wall thickening, suggesting COPD. No pneumothorax. 2. Small dependent bilateral pleural effusions with mild dependent atelectasis in the lower lobes, right greater than left. 3. Severe postinfectious/postinflammatory scarring in the left upper lobe. 4. No acute consolidative airspace disease, lung masses or significant pulmonary nodules. 5. Small volume upper abdominal ascites. 6. Left main and 3 vessel coronary atherosclerosis.    HOSPITAL COURSE:   Acute liver failure with ascites/ MELD score= 27, 19.6% 81-month mortality - Suspected to be due to remote Hx EtOH abuse versus right heart failure.  Underwent extensive work-up directed by gastroenterology. Viral hepatitis serologies negative. Alpha 1 AT normal, AMA normal, smooth muscle Ab negative,  IgG 683 (norm700-1600). ANA pending  -6/26 IR paracentesis: 2.5 L fluid aspirated.  No SBP. -He should avoid Tylenol products.   - Echocardiogram consistent with systolic and diastolic CHF, pulmonary HTN.  See results below - Ammonia level has been normal  Coagulopathy -Secondary to liver failure -Normalized with vitamin K 5 mg x 3 doses - Vitamin K 5 mg 2 x /wk   COPD - On 3.5 L O2 at home.  Currently on 4 L O2  via [place]  with SPO2 91% - CXR positive for emphysema see results below - RIGHT lung lucency?  Artifact vs lung nodule.  CT scan shows significant emphysema.  Small pleural effusions noted.  Postinflammatory scarring also noted.  But nothing concerning was identified otherwise.  -Duoneb q 4hr -Dulera 100-5 mcg 2 puff BID He is also on chronic steroids in the form of prednisone.  This was verified with his wife.  He takes 10 mg on a daily basis.  He has not been getting that medication here in the hospital.  He will be given a slightly higher dose and then taper down quickly to 10 mg daily.  Infrarenal 3.2 cm abdominal aortic aneurysm.  -No bleeding issues -Will need to follow with Vascular surgery as outpatient with repeat CT in 6 months   Acute systolic and diastolic CHF (baseline weight unknown) -Strict in and out since admission negative -7.1 L -Echocardiogram consistent with systolic and diastolic CHF see results below.   -Cardiology was consulted.  He appears to be quite stable from their standpoint.  No further work-up needed at this time.  Continue medical management.   - 6/27 Coreg 3.125 mg BID -Spironolactone 100 mg daily: Per GI fluid management Furosemide was discontinued.  Possible atrial fibrillation A. fib was apparently seen on EKG.  Telemetry however shown sinus rhythm.  Few beats of possible A. fib noted.  See cardiology notes.  Continue beta-blocker.  No anticoagulation due to acute liver failure as well as coagulopathy.    Pulmonary HTN -See CHF  Essential HTN -Blood pressure stable  CAD S/P MI 2012/DES -EKG sinus rhythm, with occasional PVCs, incomplete right bundle branch block, right ventricular hypertrophy, QTC 459. Tn 0.14 .Denies CP - Troponins were noted to be elevated but stable.  Patient asymptomatic. -He is on aspirin and Plavix also on beta-blocker.   Normocytic anemia and thrombocytopenia Hemoglobin is stable.  Low platelet counts are due to liver  disease.  No evidence for acute bleeding.   Acute kidney injury -Most likely secondary to dehydration -Monitor closely -resolved  Hyperlipidemia -  LDL 64  Depression - Zoloft 100 mg daily  Hypokalemia Potassium has been repleted.  Magnesium 1.8.  Dysphasia - RN staff witnessed what appeared to be several episodes of aspiration.   - 6/28 patient failed swallow evaluation: Fluid consistency nectar thick.  Will need to be followed at the skilled nursing facility.  Goals of care Seen by palliative medicine.  Patient remains full code for now.  Overall stable.  Okay for discharge to skilled nursing facility.    PERTINENT LABS:  The results of significant diagnostics from this hospitalization (including imaging, microbiology, ancillary and laboratory) are listed below for reference.     Labs: Basic Metabolic Panel: Recent Labs  Lab 07/03/17 0414 07/04/17 0401 07/05/17 0451 07/06/17 0532 07/07/17 0406  NA 141 143 145 143 141  K 3.0* 3.6 2.9* 3.6 3.4*  CL 99 99 96* 97* 96*  CO2 32 36* 39* 38* 35*  GLUCOSE 94 85 86 103* 93  BUN 44* 30* 19 20 18   CREATININE 1.51* 1.16 1.01 1.08 1.10  CALCIUM 8.3* 8.1* 8.5* 8.2* 8.4*  MG  --  1.8 1.9 1.8  --    Liver Function Tests: Recent Labs  Lab 07/02/17 0415 07/03/17 0414  AST 1,093* 517*  ALT 637* 546*  ALKPHOS 165* 147*  BILITOT 3.0* 2.4*  PROT 6.0* 5.9*  ALBUMIN 3.0* 2.8*    Recent Labs  Lab 07/02/17 1403  07/04/17 0401 07/05/17 0451 07/06/17 0532  AMMONIA 30 12 20 27    CBC: Recent Labs  Lab 07/02/17 0415 07/03/17 0414 07/04/17 0401 07/05/17 0451 07/06/17 0532  WBC 9.0 12.4* 9.0 7.1 6.6  NEUTROABS 8.4*  --   --   --   --   HGB 10.9* 10.3* 9.9* 9.8* 9.9*  HCT 37.4* 35.4* 34.3* 34.8* 35.4*  MCV 79.1 78.8 80.0 80.2 80.5  PLT 107* 105* 84* 74* 74*   Cardiac Enzymes: Recent Labs  Lab 07/02/17 0415 07/02/17 0843 07/02/17 1403  TROPONINI 0.17* 0.16* 0.16*   BNP: BNP (last 3  results) Recent Labs    07/02/17 0415  BNP 2,731.4*     IMAGING STUDIES Ct Chest Wo Contrast  Result Date: 07/05/2017 CLINICAL DATA:  Inpatient. COPD. Indeterminate right lung lucency on recent chest radiograph. EXAM: CT CHEST WITHOUT CONTRAST TECHNIQUE: Multidetector CT imaging of the chest was performed following the standard protocol without IV contrast. COMPARISON:  07/02/2017 chest radiograph.  12/08/2014 chest CT. FINDINGS: Cardiovascular: Normal heart size. Small pericardial effusion/thickening. Left main and 3 vessel coronary atherosclerosis. Atherosclerotic nonaneurysmal thoracic aorta. Stable top-normal main pulmonary artery. Mediastinum/Nodes: No discrete thyroid nodules. Unremarkable esophagus. No pathologically enlarged axillary, mediastinal or hilar lymph nodes, noting limited sensitivity for the detection of hilar adenopathy on this noncontrast study. Lungs/Pleura: No pneumothorax. Small dependent bilateral pleural effusions, right greater than left. Mild dependent atelectasis in the right greater than left lower lobes. Severe centrilobular emphysema with diffuse bronchial wall thickening. Thick parenchymal band with associated prominent volume loss, bronchiectasis and distortion in the left upper lobe, compatible with postinfectious/postinflammatory scarring. No acute consolidative airspace disease, lung masses or significant pulmonary nodules. Upper abdomen: Small volume upper abdominal ascites, predominantly perihepatic. Musculoskeletal: No aggressive appearing focal osseous lesions. Moderate thoracic spondylosis. IMPRESSION: 1. Severe centrilobular emphysema with diffuse bronchial wall thickening, suggesting COPD. No pneumothorax. 2. Small dependent bilateral pleural effusions with mild dependent atelectasis in the lower lobes, right greater than left. 3. Severe postinfectious/postinflammatory scarring in the left upper lobe. 4. No acute consolidative airspace disease, lung masses or  significant pulmonary nodules. 5. Small volume upper abdominal ascites. 6. Left main and 3 vessel coronary atherosclerosis. Aortic Atherosclerosis (ICD10-I70.0) and Emphysema (ICD10-J43.9). Electronically Signed   By: Delbert PhenixJason A Poff M.D.   On: 07/05/2017 21:07   Dg Chest Port 1 View  Result Date: 07/02/2017 CLINICAL DATA:  67 year old male with shortness of breath.  COPD. EXAM: PORTABLE CHEST 1 VIEW COMPARISON:  Chest radiograph dated 07/01/2017 FINDINGS: There is emphysematous changes of the lungs. There is an area of pleural thickening and nodularity in the left upper lobe likely related to underlying scarring. This is similar to studies dating back to 2016. Right costophrenic angle density may represent scarring or trace pleural effusion. No definite pneumothorax. Apparent linear lucency along the peripheral right lung likely related to skin fold artifacts as lung parenchyma appear to extend beyond this line. If there is clinical concern for pneumothorax further evaluation with CT may provide better evaluation. Stable cardiac silhouette. There is traction of the upper mediastinum to the left secondary to scarring. No acute osseous pathology. IMPRESSION: 1. Severe emphysema with areas of subpleural scarring. 2. Right lung lucency most likely artifactual. CT may provide better evaluation if clinically indicated. No interval change since the earlier radiograph. Electronically Signed   By: Elgie CollardArash  Radparvar M.D.   On: 07/02/2017 04:20   Dg Swallowing Func-speech Pathology  Result Date: 07/04/2017 Objective Swallowing Evaluation: Type of Study: MBS-Modified Barium Swallow  Study  Patient Details Name: Shaun Schmitt MRN: [id number] Date of Birth: 1950/08/04 Today's Date: 07/04/2017 Time: SLP Start Time (ACUTE ONLY): 1330 -SLP Stop Time (ACUTE ONLY): 1400 SLP Time Calculation (min) (ACUTE ONLY): 30 min Past Medical History: Past Medical History: Diagnosis Date . 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 Arthritis   Gout . Bronchitis April 23, 2012 . CAD (coronary artery disease) 12/28/2014  Stents x2 on ASA and Plavix . Chronic respiratory failure with hypoxia (HCC) 02/16/2015 . COPD, very severe (HCC) 12/28/2014 . Elevated LFTs 06/2017 . History of tobacco abuse 02/16/2015 . Hypertension  . Occlusion and stenosis of carotid artery without mention of cerebral infarction 06/25/2013 . Pain in both knees   Right worse , bilateral Hips and ankles, numbness  . Pancytopenia (HCC) 12/28/2014 . Peripheral edema 06/26/2017 . Protein-calorie malnutrition, mild (HCC) 12/28/2014 . Pulmonary nodule/lesion, solitary 12/28/2014  RUL 8 mm nodule on OSH CT chest 11/27/14 . PVD (peripheral vascular disease) (HCC) 09/25/2011 . Renal artery stenosis (HCC) 12/07/2002  renal artery stenting 2004 and found right renal artery stent occlusion on angiography in 2013.   Marland Kitchen Rhinovirus infection 12/30/2014 Past Surgical History: Past Surgical History: Procedure Laterality Date . ABDOMINAL AORTAGRAM N/A 10/31/2011  Procedure: ABDOMINAL AORTAGRAM;  Surgeon: Larina Earthly, MD;  Location: [facility] CATH LAB;  Service: Cardiovascular;  Laterality: N/A; . CARDIAC CATHETERIZATION    cath 05/21/00 ([facility]) showed 50% ostial OM, 25-30% mRCA, EF 40-45%) . IR PARACENTESIS  07/03/2017 . JOINT REPLACEMENT  2005  Right Hip . KNEE SURGERY   . PATCH ANGIOPLASTY  11/26/2011  Procedure: PATCH ANGIOPLASTY;  Surgeon: Larina Earthly, MD;  Location: [facility] OR;  Service: Vascular;  Laterality: Right;  Using Hemashield 0.8cm x 7.6 cm vascular patch. . PR VEIN BYPASS GRAFT,AORTO-FEM-POP   . REPAIR ILIAC ARTERY  11/26/2011  Procedure: REPAIR ILIAC ARTERY;  Surgeon: Larina Earthly, MD;  Location[facility] OR;  Service: Vascular;  Laterality: Right;  With patch angioplasty. HPI: Pt is a 67 y.o. male who presented to [facility] with SOB x1 week, found to have acute liver failure with ascites. Pt was noted to cough after drinking liquids,  therefore he was made NPO and swallow evaluation was ordered. PMH: COPD on home oxygen 3.5 L, Emphysema, Tobacco abuse, CHF, HTN, CAD S/P stents x 2, MI x2, renal artery stenosis, S/P RIGHT renal artery stent, EtOH abuse. Pt had previous swallow testing at [facility] in 2016 after intubation, advanced to Dys 3 diet and nectar thick liquids with a chin tuck. ENT assessed for possible vocal cord injection due to concern for paralysis but they did not feel this was necessary on an inpatient basis.  Subjective: pt describes trouble swallowing previously, has had PNA, but has trouble recalling specific details Assessment / Plan / Recommendation CHL IP CLINICAL IMPRESSIONS 07/04/2017 Clinical Impression Pt presenting with a moderate oral, severe pharyngeal dysphagia, suspect chronic. Oral phase characterized by piecemeal swallowing and delayed oral transit along with premature spillage of liquid consistencies; pharyngeal phase showed decreased anterior laryngeal movement/ reduced epiglottic deflection and reduced airway closure resulting in frank, silent aspiration of thin liquids before the swallow, penetration of nectar thick liquids before swallow and aspiration post-swallow which appeared to be from vallecular residuals. Cued cough was ineffective in clearing aspirated material. Pt with persistent moderate residuals in the vallecula and pyriform sinuses; when cued to swallow a 2nd time pt reported he did not have enough saliva  to swallow. Pt did tolerate nectar-thick liquids by teaspoon with only flash penetration which cleared some of the puree material residuals. Chin tuck and head turn were attempted but unfortunately did not appear effective in prevention of material entering airway or residuals. For now, recommend full liquid diet with liquids thickened to nectar by teaspoon only. Will appreciate palliative consult as it seems pt does not desire altered diet; states "I think I'm nearing the end and this isn't how I  wanted it to go" and has not been compliant with thickened liquids in the past. May be a good candidate for [employer] free water protocol. Will continue to follow briefly for diet tolerance/ education.  SLP Visit Diagnosis Dysphagia, oropharyngeal phase (R13.12) Attention and concentration deficit following -- Frontal lobe and executive function deficit following -- Impact on safety and function Severe aspiration risk   CHL IP TREATMENT RECOMMENDATION 07/04/2017 Treatment Recommendations Therapy as outlined in treatment plan below   Prognosis 07/04/2017 Prognosis for Safe Diet Advancement Guarded Barriers to Reach Goals Time post onset;Severity of deficits Barriers/Prognosis Comment -- CHL IP DIET RECOMMENDATION 07/04/2017 SLP Diet Recommendations Nectar thick liquid;Other (Comment) Liquid Administration via Spoon Medication Administration Crushed with puree Compensations Slow rate;Small sips/bites Postural Changes Remain semi-upright after after feeds/meals (Comment);Seated upright at 90 degrees   CHL IP OTHER RECOMMENDATIONS 07/04/2017 Recommended Consults (No Data) Oral Care Recommendations Oral care before and after PO Other Recommendations Clarify dietary restrictions   CHL IP FOLLOW UP RECOMMENDATIONS 07/04/2017 Follow up Recommendations Other (comment)   CHL IP FREQUENCY AND DURATION 07/04/2017 Speech Therapy Frequency (ACUTE ONLY) min 1 x/week Treatment Duration 1 week      CHL IP ORAL PHASE 07/04/2017 Oral Phase Impaired Oral - Pudding Teaspoon -- Oral - Pudding Cup -- Oral - Honey Teaspoon -- Oral - Honey Cup -- Oral - Nectar Teaspoon Delayed oral transit;Premature spillage Oral - Nectar Cup Piecemeal swallowing;Delayed oral transit;Decreased bolus cohesion;Premature spillage Oral - Nectar Straw -- Oral - Thin Teaspoon -- Oral - Thin Cup Piecemeal swallowing;Delayed oral transit;Decreased bolus cohesion;Premature spillage Oral - Thin Straw -- Oral - Puree Premature spillage Oral - Mech Soft -- Oral - Regular --  Oral - Multi-Consistency -- Oral - Pill -- Oral Phase - Comment --  CHL IP PHARYNGEAL PHASE 07/04/2017 Pharyngeal Phase Impaired Pharyngeal- Pudding Teaspoon -- Pharyngeal -- Pharyngeal- Pudding Cup -- Pharyngeal -- Pharyngeal- Honey Teaspoon -- Pharyngeal -- Pharyngeal- Honey Cup -- Pharyngeal -- Pharyngeal- Nectar Teaspoon Delayed swallow initiation-pyriform sinuses;Reduced anterior laryngeal mobility;Reduced airway/laryngeal closure;Penetration/Aspiration during swallow;Pharyngeal residue - valleculae Pharyngeal Material enters airway, remains ABOVE vocal cords then ejected out Pharyngeal- Nectar Cup Delayed swallow initiation-pyriform sinuses;Reduced anterior laryngeal mobility;Reduced airway/laryngeal closure;Penetration/Apiration after swallow;Pharyngeal residue - valleculae;Pharyngeal residue - pyriform Pharyngeal Material enters airway, passes BELOW cords without attempt by patient to eject out (silent aspiration) Pharyngeal- Nectar Straw -- Pharyngeal -- Pharyngeal- Thin Teaspoon -- Pharyngeal -- Pharyngeal- Thin Cup Reduced anterior laryngeal mobility;Reduced airway/laryngeal closure;Penetration/Aspiration before swallow;Pharyngeal residue - valleculae;Pharyngeal residue - pyriform Pharyngeal Material enters airway, passes BELOW cords without attempt by patient to eject out (silent aspiration) Pharyngeal- Thin Straw -- Pharyngeal -- Pharyngeal- Puree Reduced epiglottic inversion;Reduced anterior laryngeal mobility;Pharyngeal residue - valleculae;Pharyngeal residue - pyriform Pharyngeal -- Pharyngeal- Mechanical Soft -- Pharyngeal -- Pharyngeal- Regular -- Pharyngeal -- Pharyngeal- Multi-consistency -- Pharyngeal -- Pharyngeal- Pill -- Pharyngeal -- Pharyngeal Comment --  CHL IP CERVICAL ESOPHAGEAL PHASE 07/04/2017 Cervical Esophageal Phase WFL Pudding Teaspoon -- Pudding Cup -- Honey Teaspoon -- Honey Cup -- Nectar Teaspoon -- Nectar  Cup -- [employer] -- Thin Teaspoon -- Thin Cup -- Thin Straw -- Puree --  Mechanical Soft -- Regular -- Multi-consistency -- Pill -- Cervical Esophageal Comment -- No flowsheet data found. Amy Cecille Aver, MA, CCC-SLP 07/04/2017, 3:04 PM [id number]              Ir Paracentesis  Result Date: 07/03/2017 INDICATION: Cirrhosis by imaging, CHF, acute renal failure, ascites; request received for diagnostic and therapeutic paracentesis. EXAM: ULTRASOUND GUIDED DIAGNOSTIC AND THERAPEUTIC PARACENTESIS MEDICATIONS: None COMPLICATIONS: None immediate. PROCEDURE: Informed written consent was obtained from the patient after a discussion of the risks, benefits and alternatives to treatment. A timeout was performed prior to the initiation of the procedure. Initial ultrasound scanning demonstrates a moderate amount of ascites within the right mid to lower abdominal quadrant. The right mid to lower abdomen was prepped and draped in the usual sterile fashion. 1% lidocaine with epinephrine was used for local anesthesia. Following this, a 19 gauge, 7-cm, Yueh catheter was introduced. An ultrasound image was saved for documentation purposes. The paracentesis was performed. The catheter was removed and a dressing was applied. The patient tolerated the procedure well without immediate post procedural complication. FINDINGS: A total of approximately 2.5 liters of yellow fluid was removed. Samples were sent to the laboratory as requested by the clinical team. IMPRESSION: Successful ultrasound-guided diagnostic and therapeutic paracentesis yielding 2.5 liters of peritoneal fluid. Read by: Jeananne Rama, PA-C Electronically Signed   By: Gilmer Mor D.O.   On: 07/03/2017 12:34    DISCHARGE EXAMINATION: Vitals:   07/06/17 2300 07/07/17 0500 07/07/17 0746 07/07/17 0825  BP:  (!) 139/56 119/67   Pulse:  (!) 58 64   Resp:  18    Temp:  98 F (36.7 C)    TempSrc:      SpO2: 98% 97%  98%  Weight:      Height:       General appearance: alert, cooperative, appears stated age and no distress Resp: clear to  auscultation bilaterally Cardio: regular rate and rhythm, S1, S2 normal, no murmur, click, rub or gallop GI: soft, non-tender; bowel sounds normal; no masses,  no organomegaly Extremities: extremities normal, atraumatic, no cyanosis or edema  DISPOSITION: Skilled nursing facility  Discharge Instructions    Call MD for:  extreme fatigue   Complete by:  As directed    Call MD for:  persistant dizziness or light-headedness   Complete by:  As directed    Call MD for:  persistant nausea and vomiting   Complete by:  As directed    Call MD for:  severe uncontrolled pain   Complete by:  As directed    Call MD for:  temperature >100.4   Complete by:  As directed    Discharge instructions   Complete by:  As directed    Please review instructions on the discharge summary.  You were cared for by a hospitalist during your hospital stay. If you have any questions about your discharge medications or the care you received while you were in the hospital after you are discharged, you can call the unit and asked to speak with the hospitalist on call if the hospitalist that took care of you is not available. Once you are discharged, your primary care physician will handle any further medical issues. Please note that NO REFILLS for any discharge medications will be authorized once you are discharged, as it is imperative that you return to your primary care physician (or establish  a relationship with a primary care physician if you do not have one) for your aftercare needs so that they can reassess your need for medications and monitor your lab values. If you do not have a primary care physician, you can call [phone] for a physician referral.   Increase activity slowly   Complete by:  As directed        Allergies as of 07/07/2017      Reactions   Codeine Nausea And Vomiting   Upset stomach      Medication List    STOP taking these medications   cetirizine 10 MG tablet Commonly known as:  ZYRTEC    furosemide 20 MG tablet Commonly known as:  LASIX   guaiFENesin 600 MG 12 hr tablet Commonly known as:  MUCINEX   Melatonin 3 MG Tabs   metoprolol tartrate 25 MG tablet Commonly known as:  LOPRESSOR   omeprazole 20 MG capsule Commonly known as:  PRILOSEC   pravastatin 40 MG tablet Commonly known as:  PRAVACHOL   traMADol 50 MG tablet Commonly known as:  ULTRAM     TAKE these medications   ADVAIR DISKUS 250-50 MCG/DOSE Aepb Generic drug:  Fluticasone-Salmeterol Inhale 1 puff into the lungs every 12 (twelve) hours.   ALPRAZolam 0.25 MG tablet Commonly known as:  XANAX Take 1 tablet (0.25 mg total) by mouth 2 (two) times daily as needed for anxiety. 0.125mg  in the morning and 0.25mg  in the evening What changed:    how much to take  when to take this  reasons to take this   antiseptic oral rinse Liqd 15 mLs by Mouth Rinse route as needed for dry mouth. Biotene Mouth Spray   aspirin 81 MG tablet Take 81 mg by mouth daily.   carvedilol 3.125 MG tablet Commonly known as:  COREG Take 1 tablet (3.125 mg total) by mouth 2 (two) times daily with a meal.   cholecalciferol 1000 units tablet Commonly known as:  VITAMIN D Take 1,000 Units by mouth every morning.   clopidogrel 75 MG tablet Commonly known as:  PLAVIX Take 75 mg by mouth every evening.   COMBIVENT RESPIMAT 20-100 MCG/ACT Aers respimat Generic drug:  Ipratropium-Albuterol Inhale 2 puffs into the lungs every 6 (six) hours as needed. For shortness of breath What changed:  Another medication with the same name was changed. Make sure you understand how and when to take each.   ipratropium-albuterol 0.5-2.5 (3) MG/3ML Soln Commonly known as:  DUONEB Take 3 mLs by nebulization every 4 (four) hours as needed (for wheezing). What changed:    when to take this  reasons to take this   HYDROcodone-acetaminophen 5-325 MG tablet Commonly known as:  NORCO/VICODIN Take 1-2 tablets by mouth every 4 (four) hours  as needed for moderate pain.   pantoprazole 40 MG tablet Commonly known as:  PROTONIX Take 1 tablet (40 mg total) by mouth daily at 6 (six) AM. Start taking on:  July 17, 2017   phytonadione 5 MG tablet Commonly known as:  VITAMIN K Take 1 tablet (5 mg total) by mouth 2 (two) times a week. Start taking on:  17-Jul-2017   predniSONE 10 MG tablet Commonly known as:  DELTASONE Take 60 mg once daily for 3 days, followed by 40 mg once daily for 3 days, followed by 20 mg once daily for 3 days, followed by 10 mg once daily indefinitely as before. What changed:    how much to take  how to take this  when to take this  additional instructions   prenatal multivitamin Tabs tablet Take 1 tablet by mouth daily at 12 noon.   PROBIOTIC-10 PO Take 1 capsule by mouth every morning.   senna-docusate 8.6-50 MG tablet Commonly known as:  Senokot-S Take 1 tablet by mouth at bedtime as needed for mild constipation.   sertraline 100 MG tablet Commonly known as:  ZOLOFT Take 200 mg by mouth daily.   SPIRIVA HANDIHALER 18 MCG inhalation capsule Generic drug:  tiotropium Place 1 capsule into inhaler and inhale every morning.   spironolactone 100 MG tablet Commonly known as:  ALDACTONE Take 1 tablet (100 mg total) by mouth daily.          TOTAL DISCHARGE TIME: 35 minutes  Osvaldo Shipper  [facility] Pager [phone]  07/07/2017, 10:36 AM

## 2017-07-07 NOTE — Discharge Instructions (Signed)
Cirrhosis Cirrhosis is long-term (chronic) liver injury. The liver is your largest internal organ, and it performs many functions. The liver converts food into energy, removes toxic material from your blood, makes important proteins, and absorbs necessary vitamins from your diet. If you have cirrhosis, it means many of your healthy liver cells have been replaced by scar tissue. This prevents blood from flowing through your liver, which makes it difficult for your liver to function. This scarring is not reversible, but treatment can prevent it from getting worse. What are the causes? Hepatitis C and long-term alcohol abuse are the most common causes of cirrhosis. Other causes include:  Nonalcoholic fatty liver disease.  Hepatitis B infection.  Autoimmune hepatitis.  Diseases that cause blockage of ducts inside the liver.  Inherited liver diseases.  Reactions to certain long-term medicines.  Parasitic infections.  Long-term exposure to certain toxins.  What increases the risk? You may have a higher risk of cirrhosis if you:  Have certain hepatitis viruses.  Abuse alcohol, especially if you are male.  Are overweight.  Share needles.  Have unprotected sex with someone who has hepatitis.  What are the signs or symptoms? You may not have any signs and symptoms at first. Symptoms may not develop until the damage to your liver starts to get worse. Signs and symptoms of cirrhosis may include:  Tenderness in the right-upper part of your abdomen.  Weakness and tiredness (fatigue).  Loss of appetite.  Nausea.  Weight loss and muscle loss.  Itchiness.  Yellow skin and eyes (jaundice).  Buildup of fluid in the abdomen (ascites).  Swelling of the feet and ankles (edema).  Appearance of tiny blood vessels under the skin.  Mental confusion.  Easy bruising and bleeding.  How is this diagnosed? Your health care provider may suspect cirrhosis based on your symptoms and  medical history, especially if you have other medical conditions or a history of alcohol abuse. Your health care provider will do a physical exam to feel your liver and check for signs of cirrhosis. Your health care provider may perform other tests, including:  Blood tests to check: ? Whether you have hepatitis B or C. ? Kidney function. ? Liver function.  Imaging tests such as: ? MRI or CT scan to look for changes seen in advanced cirrhosis. ? Ultrasound to see if normal liver tissue is being replaced by scar tissue.  A procedure using a long needle to take a sample of liver tissue (biopsy) for examination under a microscope. Liver biopsy can confirm the diagnosis of cirrhosis.  How is this treated? Treatment depends on how damaged your liver is and what caused the damage. Treatment may include treating cirrhosis symptoms or treating the underlying causes of the condition to try to slow the progression of the damage. Treatment may include:  Making lifestyle changes, such as: ? Eating a healthy diet. ? Restricting salt intake. ? Maintaining a healthy weight. ? Not abusing drugs or alcohol.  Taking medicines to: ? Treat liver infections or other infections. ? Control itching. ? Reduce fluid buildup. ? Reduce certain blood toxins. ? Reduce risk of bleeding from enlarged blood vessels in the stomach or esophagus (varices).  If varices are causing bleeding problems, you may need treatment with a procedure that ties up the vessels causing them to fall off (band ligation).  If cirrhosis is causing your liver to fail, your health care provider may recommend a liver transplant.  Other treatments may be recommended depending on any complications   of cirrhosis, such as liver-related kidney failure (hepatorenal syndrome).  Follow these instructions at home:  Take medicines only as directed by your health care provider. Do not use drugs that are toxic to your liver. Ask your health care  provider before taking any new medicines, including over-the-counter medicines.  Rest as needed.  Eat a well-balanced diet. Ask your health care provider or dietitian for more information.  You may have to follow a low-salt diet or restrict your water intake as directed.  Do not drink alcohol. This is especially important if you are taking acetaminophen.  Keep all follow-up visits as directed by your health care provider. This is important. Contact a health care provider if:  You have fatigue or weakness that is getting worse.  You develop swelling of the hands, feet, legs, or face.  You have a fever.  You develop loss of appetite.  You have nausea or vomiting.  You develop jaundice.  You develop easy bruising or bleeding. Get help right away if:  You vomit bright red blood or a material that looks like coffee grounds.  You have blood in your stools.  Your stools appear black and tarry.  You become confused.  You have chest pain or trouble breathing. This information is not intended to replace advice given to you by your health care provider. Make sure you discuss any questions you have with your health care provider. Document Released: 12/25/2004 Document Revised: 05/05/2015 Document Reviewed: 09/02/2013 Elsevier Interactive Patient Education  2018 Elsevier Inc.  

## 2017-07-07 NOTE — Progress Notes (Addendum)
Pt has 2 pending SNF placements.  CSW checked with both facilities concerning possible admission on Sunday.  Both facilities stated that they are not able to review information and will have to wait until Monday .  CSW will follow up with facilities on Monday. Physician was updated on possible SNF placement.  Budd Palmerara Ainslie Mazurek LCSWA [phone]

## 2017-07-08 DIAGNOSIS — N189 Chronic kidney disease, unspecified: Secondary | ICD-10-CM | POA: Diagnosis not present

## 2017-07-08 DIAGNOSIS — D631 Anemia in chronic kidney disease: Secondary | ICD-10-CM | POA: Diagnosis not present

## 2017-07-08 DIAGNOSIS — I4891 Unspecified atrial fibrillation: Secondary | ICD-10-CM | POA: Diagnosis not present

## 2017-07-08 DIAGNOSIS — J449 Chronic obstructive pulmonary disease, unspecified: Secondary | ICD-10-CM | POA: Diagnosis not present

## 2017-07-08 DIAGNOSIS — K7460 Unspecified cirrhosis of liver: Secondary | ICD-10-CM | POA: Diagnosis not present

## 2017-07-08 DIAGNOSIS — R2681 Unsteadiness on feet: Secondary | ICD-10-CM | POA: Diagnosis not present

## 2017-07-08 DIAGNOSIS — A419 Sepsis, unspecified organism: Secondary | ICD-10-CM | POA: Diagnosis not present

## 2017-07-08 DIAGNOSIS — I2510 Atherosclerotic heart disease of native coronary artery without angina pectoris: Secondary | ICD-10-CM | POA: Diagnosis not present

## 2017-07-08 DIAGNOSIS — R278 Other lack of coordination: Secondary | ICD-10-CM | POA: Diagnosis not present

## 2017-07-08 DIAGNOSIS — K7290 Hepatic failure, unspecified without coma: Secondary | ICD-10-CM | POA: Diagnosis not present

## 2017-07-08 DIAGNOSIS — R0902 Hypoxemia: Secondary | ICD-10-CM | POA: Diagnosis not present

## 2017-07-08 DIAGNOSIS — D6959 Other secondary thrombocytopenia: Secondary | ICD-10-CM | POA: Diagnosis not present

## 2017-07-08 DIAGNOSIS — Z955 Presence of coronary angioplasty implant and graft: Secondary | ICD-10-CM | POA: Diagnosis not present

## 2017-07-08 DIAGNOSIS — Z9981 Dependence on supplemental oxygen: Secondary | ICD-10-CM | POA: Diagnosis not present

## 2017-07-08 DIAGNOSIS — R062 Wheezing: Secondary | ICD-10-CM | POA: Diagnosis not present

## 2017-07-08 DIAGNOSIS — Z7401 Bed confinement status: Secondary | ICD-10-CM | POA: Diagnosis not present

## 2017-07-08 DIAGNOSIS — Z515 Encounter for palliative care: Secondary | ICD-10-CM | POA: Diagnosis not present

## 2017-07-08 DIAGNOSIS — M2550 Pain in unspecified joint: Secondary | ICD-10-CM | POA: Diagnosis not present

## 2017-07-08 DIAGNOSIS — K7210 Chronic hepatic failure without coma: Secondary | ICD-10-CM | POA: Diagnosis not present

## 2017-07-08 DIAGNOSIS — Z7982 Long term (current) use of aspirin: Secondary | ICD-10-CM | POA: Diagnosis not present

## 2017-07-08 DIAGNOSIS — J9621 Acute and chronic respiratory failure with hypoxia: Secondary | ICD-10-CM | POA: Diagnosis not present

## 2017-07-08 DIAGNOSIS — I739 Peripheral vascular disease, unspecified: Secondary | ICD-10-CM | POA: Diagnosis not present

## 2017-07-08 DIAGNOSIS — R1312 Dysphagia, oropharyngeal phase: Secondary | ICD-10-CM | POA: Diagnosis not present

## 2017-07-08 DIAGNOSIS — Z79899 Other long term (current) drug therapy: Secondary | ICD-10-CM | POA: Diagnosis not present

## 2017-07-08 DIAGNOSIS — J440 Chronic obstructive pulmonary disease with acute lower respiratory infection: Secondary | ICD-10-CM | POA: Diagnosis not present

## 2017-07-08 DIAGNOSIS — I509 Heart failure, unspecified: Secondary | ICD-10-CM | POA: Diagnosis not present

## 2017-07-08 DIAGNOSIS — R05 Cough: Secondary | ICD-10-CM | POA: Diagnosis not present

## 2017-07-08 DIAGNOSIS — J189 Pneumonia, unspecified organism: Secondary | ICD-10-CM | POA: Diagnosis not present

## 2017-07-08 DIAGNOSIS — R531 Weakness: Secondary | ICD-10-CM | POA: Diagnosis not present

## 2017-07-08 DIAGNOSIS — R2689 Other abnormalities of gait and mobility: Secondary | ICD-10-CM | POA: Diagnosis not present

## 2017-07-08 DIAGNOSIS — M6281 Muscle weakness (generalized): Secondary | ICD-10-CM | POA: Diagnosis not present

## 2017-07-08 DIAGNOSIS — I130 Hypertensive heart and chronic kidney disease with heart failure and stage 1 through stage 4 chronic kidney disease, or unspecified chronic kidney disease: Secondary | ICD-10-CM | POA: Diagnosis not present

## 2017-07-08 DIAGNOSIS — I252 Old myocardial infarction: Secondary | ICD-10-CM | POA: Diagnosis not present

## 2017-07-08 DIAGNOSIS — J441 Chronic obstructive pulmonary disease with (acute) exacerbation: Secondary | ICD-10-CM | POA: Diagnosis not present

## 2017-07-08 DIAGNOSIS — R0602 Shortness of breath: Secondary | ICD-10-CM | POA: Diagnosis not present

## 2017-07-08 LAB — BASIC METABOLIC PANEL
Anion gap: 10 (ref 5–15)
BUN: 17 mg/dL (ref 8–23)
CALCIUM: 8.6 mg/dL — AB (ref 8.9–10.3)
CHLORIDE: 97 mmol/L — AB (ref 98–111)
CO2: 35 mmol/L — AB (ref 22–32)
Creatinine, Ser: 1.05 mg/dL (ref 0.61–1.24)
GFR calc Af Amer: >60 mL/min (ref >60)
GFR calc non Af Amer: >60 mL/min (ref >60)
GLUCOSE: 93 mg/dL (ref 70–99)
Potassium: 4 mmol/L (ref 3.5–5.1)
Sodium: 142 mmol/L (ref 135–145)

## 2017-07-08 LAB — ANTINUCLEAR ANTIBODIES, IFA: ANA Ab, IFA: NEGATIVE

## 2017-07-10 DIAGNOSIS — I509 Heart failure, unspecified: Secondary | ICD-10-CM | POA: Diagnosis not present

## 2017-07-10 DIAGNOSIS — I4891 Unspecified atrial fibrillation: Secondary | ICD-10-CM | POA: Diagnosis not present

## 2017-07-10 DIAGNOSIS — Z79899 Other long term (current) drug therapy: Secondary | ICD-10-CM | POA: Diagnosis not present

## 2017-07-10 DIAGNOSIS — R531 Weakness: Secondary | ICD-10-CM | POA: Diagnosis not present

## 2017-07-14 DIAGNOSIS — I252 Old myocardial infarction: Secondary | ICD-10-CM | POA: Diagnosis not present

## 2017-07-14 DIAGNOSIS — Z79899 Other long term (current) drug therapy: Secondary | ICD-10-CM | POA: Diagnosis not present

## 2017-07-14 DIAGNOSIS — Z9981 Dependence on supplemental oxygen: Secondary | ICD-10-CM | POA: Diagnosis not present

## 2017-07-14 DIAGNOSIS — J9621 Acute and chronic respiratory failure with hypoxia: Secondary | ICD-10-CM | POA: Diagnosis not present

## 2017-07-14 DIAGNOSIS — R05 Cough: Secondary | ICD-10-CM | POA: Diagnosis not present

## 2017-07-14 DIAGNOSIS — R0902 Hypoxemia: Secondary | ICD-10-CM | POA: Diagnosis not present

## 2017-07-14 DIAGNOSIS — J440 Chronic obstructive pulmonary disease with acute lower respiratory infection: Secondary | ICD-10-CM | POA: Diagnosis not present

## 2017-07-14 DIAGNOSIS — I2510 Atherosclerotic heart disease of native coronary artery without angina pectoris: Secondary | ICD-10-CM | POA: Diagnosis not present

## 2017-07-14 DIAGNOSIS — Z7401 Bed confinement status: Secondary | ICD-10-CM | POA: Diagnosis not present

## 2017-07-14 DIAGNOSIS — J189 Pneumonia, unspecified organism: Secondary | ICD-10-CM | POA: Diagnosis not present

## 2017-07-14 DIAGNOSIS — N189 Chronic kidney disease, unspecified: Secondary | ICD-10-CM | POA: Diagnosis not present

## 2017-07-14 DIAGNOSIS — Z7982 Long term (current) use of aspirin: Secondary | ICD-10-CM | POA: Diagnosis not present

## 2017-07-14 DIAGNOSIS — D631 Anemia in chronic kidney disease: Secondary | ICD-10-CM | POA: Diagnosis not present

## 2017-07-14 DIAGNOSIS — I130 Hypertensive heart and chronic kidney disease with heart failure and stage 1 through stage 4 chronic kidney disease, or unspecified chronic kidney disease: Secondary | ICD-10-CM | POA: Diagnosis not present

## 2017-07-14 DIAGNOSIS — R0602 Shortness of breath: Secondary | ICD-10-CM | POA: Diagnosis not present

## 2017-07-14 DIAGNOSIS — A419 Sepsis, unspecified organism: Secondary | ICD-10-CM | POA: Diagnosis not present

## 2017-07-14 DIAGNOSIS — D6959 Other secondary thrombocytopenia: Secondary | ICD-10-CM | POA: Diagnosis not present

## 2017-07-14 DIAGNOSIS — Z955 Presence of coronary angioplasty implant and graft: Secondary | ICD-10-CM | POA: Diagnosis not present

## 2017-07-14 DIAGNOSIS — K7460 Unspecified cirrhosis of liver: Secondary | ICD-10-CM | POA: Diagnosis not present

## 2017-07-14 DIAGNOSIS — K7290 Hepatic failure, unspecified without coma: Secondary | ICD-10-CM | POA: Diagnosis not present

## 2017-07-14 DIAGNOSIS — Z515 Encounter for palliative care: Secondary | ICD-10-CM | POA: Diagnosis not present

## 2017-07-14 DIAGNOSIS — I509 Heart failure, unspecified: Secondary | ICD-10-CM | POA: Diagnosis not present

## 2017-07-14 DIAGNOSIS — R062 Wheezing: Secondary | ICD-10-CM | POA: Diagnosis not present

## 2017-07-14 DIAGNOSIS — J441 Chronic obstructive pulmonary disease with (acute) exacerbation: Secondary | ICD-10-CM | POA: Diagnosis not present

## 2017-07-14 DIAGNOSIS — R279 Unspecified lack of coordination: Secondary | ICD-10-CM | POA: Diagnosis not present

## 2017-07-15 NOTE — Progress Notes (Deleted)
Cardiology Office Note:    Date:  07/15/2017   ID:  Shaun Schmitt, DOB 11-30-1950, MRN [id number]  PCP:  Crist FatVan Eyk, Jason, MD  Cardiologist:  Norman HerrlichBrian Ernestine Langworthy, MD    Referring MD: Leonia ReaderVan Eyk, Barbara CowerJason, MD    ASSESSMENT:    No diagnosis found. PLAN:    In order of problems listed above:  1. ***   Next appointment: ***   Medication Adjustments/Labs and Tests Ordered: Current medicines are reviewed at length with the patient today.  Concerns regarding medicines are outlined above.  No orders of the defined types were placed in this encounter.  No orders of the defined types were placed in this encounter.   No chief complaint on file.   History of Present Illness:    Shaun Schmitt is a 67 y.o. male with a hx of CAD, PAD with fem-fem bypass, carotid stenosis and renal artery PCI, hypertension and COPD referred by dr Milly JakobVan Eyke for evaluation of edema. Compliance with diet, lifestyle and medications: *** Past Medical History:  Diagnosis Date  . 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 Arthritis    Gout  . Bronchitis April 23, 2012  . CAD (coronary artery disease) 12/28/2014   Stents x2 on ASA and Plavix  . Chronic respiratory failure with hypoxia (HCC) 02/16/2015  . COPD, very severe (HCC) 12/28/2014  . Elevated LFTs 06/2017  . History of tobacco abuse 02/16/2015  . Hypertension   . Occlusion and stenosis of carotid artery without mention of cerebral infarction 06/25/2013  . Pain in both knees    Right worse , bilateral Hips and ankles, numbness   . Pancytopenia (HCC) 12/28/2014  . Peripheral edema 06/26/2017  . Protein-calorie malnutrition, mild (HCC) 12/28/2014  . Pulmonary nodule/lesion, solitary 12/28/2014   RUL 8 mm nodule on OSH CT chest 11/27/14  . PVD (peripheral vascular disease) (HCC) 09/25/2011  . Renal artery stenosis (HCC) 12/07/2002   renal artery stenting 2004 and found right renal artery stent  occlusion on angiography in 2013.    Marland Kitchen. Rhinovirus infection 12/30/2014    Past Surgical History:  Procedure Laterality Date  . ABDOMINAL AORTAGRAM N/A 10/31/2011   Procedure: ABDOMINAL AORTAGRAM;  Surgeon: Larina Earthlyodd F Early, MD;  [facility];  Service: Cardiovascular;  Laterality: N/A;  . CARDIAC CATHETERIZATION     cath 05/21/00 ([facility]) showed 50% ostial OM, 25-30% mRCA, EF 40-45%)  . IR PARACENTESIS  07/03/2017  . JOINT REPLACEMENT  2005   Right Hip  . KNEE SURGERY    . PATCH ANGIOPLASTY  11/26/2011   Procedure: PATCH ANGIOPLASTY;  Surgeon: Larina Earthlyodd F Early, MD;  [facility];  Service: Vascular;  Laterality: Right;  Using Hemashield 0.8cm x 7.6 cm vascular patch.  . PR VEIN BYPASS GRAFT,AORTO-FEM-POP    . REPAIR ILIAC ARTERY  11/26/2011   Procedure: REPAIR ILIAC ARTERY;  Surgeon: Larina Earthlyodd F Early, MD;  [facility];  Service: Vascular;  Laterality: Right;  With patch angioplasty.    Current Medications: No outpatient medications have been marked as taking for the 07/16/17 encounter (Appointment) with Baldo DaubMunley, Fergie Sherbert J, MD.     Allergies:   Codeine   Social History   Socioeconomic History  . Marital status: Married    Spouse name: Not on file  . Number of children: Not on file  . Years of education: Not on file  . Highest education level: Not on file  Occupational  History  . Not on file  Social Needs  . Financial resource strain: Not on file  . Food insecurity:    Worry: Not on file    Inability: Not on file  . Transportation needs:    Medical: Not on file    Non-medical: Not on file  Tobacco Use  . Smoking status: Former Smoker    Packs/day: 0.25    Years: 40.00    Pack years: 10.00    Types: E-cigarettes, Cigarettes  . Smokeless tobacco: Never Used  . Tobacco comment: QUIT IN 2013  Substance and Sexual Activity  . Alcohol use: Yes    Comment: OCCASIONAL  . Drug use: No  . Sexual activity: Not on file  Lifestyle  . Physical activity:    Days per week:  Not on file    Minutes per session: Not on file  . Stress: Not on file  Relationships  . Social connections:    Talks on phone: Not on file    Gets together: Not on file    Attends religious service: Not on file    Active member of club or organization: Not on file    Attends meetings of clubs or organizations: Not on file    Relationship status: Not on file  Other Topics Concern  . Not on file  Social History Narrative  . Not on file     Family History: The patient's ***family history includes Emphysema in his father and mother; Hypertension in his father and mother. ROS:   Please see the history of present illness.    All other systems reviewed and are negative.  EKGs/Labs/Other Studies Reviewed:    The following studies were reviewed today:  EKG:  EKG ordered today.  The ekg ordered today demonstrates ***  Recent Labs: 07/02/2017: B Natriuretic Peptide 2,731.4; TSH 2.552 07/03/2017: ALT 546 07/06/2017: Hemoglobin 9.9; Magnesium 1.8; Platelets 74 07/15/2017: BUN 17; Creatinine, Ser 1.05; Potassium 4.0; Sodium 142  Recent Lipid Panel    Component Value Date/Time   CHOL 109 07/04/2017 0401   TRIG 89 07/04/2017 0401   HDL 27 (L) 07/04/2017 0401   CHOLHDL 4.0 07/04/2017 0401   VLDL 18 07/04/2017 0401   LDLCALC 64 07/04/2017 0401    Physical Exam:    VS:  There were no vitals taken for this visit.    Wt Readings from Last 3 Encounters:  07/30/2017 131 lb 6.3 oz (59.6 kg)  06/25/13 175 lb 6.4 oz (79.6 kg)  06/24/12 172 lb (78 kg)     GEN: *** Well nourished, well developed in no acute distress HEENT: Normal NECK: No JVD; No carotid bruits LYMPHATICS: No lymphadenopathy CARDIAC: ***RRR, no murmurs, rubs, gallops RESPIRATORY:  Clear to auscultation without rales, wheezing or rhonchi  ABDOMEN: Soft, non-tender, non-distended MUSCULOSKELETAL:  No edema; No deformity  SKIN: Warm and dry NEUROLOGIC:  Alert and oriented x 3 PSYCHIATRIC:  Normal affect     Signed, Norman Herrlich, MD  07/15/2017 8:02 AM    [facility]

## 2017-07-16 ENCOUNTER — Ambulatory Visit: Payer: Medicare Other | Admitting: Cardiology

## 2017-07-18 DIAGNOSIS — J449 Chronic obstructive pulmonary disease, unspecified: Secondary | ICD-10-CM | POA: Diagnosis not present

## 2017-08-08 NOTE — Progress Notes (Signed)
Progress Note  Patient Name: Shaun Schmitt Date of Encounter: July 28, 2017  Primary Cardiologist: Dr. Charlton Haws, MD   Subjective   Pt feels well this AM> anticipating rehabilitation placement soon. No chest pain or palpitations.   Inpatient Medications    Scheduled Meds: . aspirin EC  81 mg Oral Daily  . carvedilol  3.125 mg Oral BID WC  . clopidogrel  75 mg Oral Daily  . ipratropium-albuterol  3 mL Nebulization TID  . mouth rinse  15 mL Mouth Rinse BID  . mometasone-formoterol  2 puff Inhalation BID  . pantoprazole  40 mg Oral Q0600  . phytonadione  5 mg Oral Once per day on Mon Thu  . predniSONE  60 mg Oral Q breakfast  . prenatal multivitamin  1 tablet Oral Q1200  . sertraline  100 mg Oral Daily  . spironolactone  100 mg Oral Daily   Continuous Infusions:  PRN Meds: albuterol, ALPRAZolam, bisacodyl, HYDROcodone-acetaminophen, morphine injection, ondansetron **OR** ondansetron (ZOFRAN) IV, senna-docusate   Vital Signs    Vitals:   07/07/17 2134 2017/07/28 0500 07/28/2017 0524 07/28/17 0851  BP: 110/65  139/69   Pulse: 67  76   Resp: 18  18   Temp: 98.1 F (36.7 C)  97.7 F (36.5 C)   TempSrc:   Oral   SpO2: 94%  95% 90%  Weight:  131 lb 6.3 oz (59.6 kg)    Height:        Intake/Output Summary (Last 24 hours) at 2017/07/28 0914 Last data filed at 07/07/2017 2200 Gross per 24 hour  Intake -  Output 355 ml  Net -355 ml   Filed Weights   07/05/17 1700 07/06/17 0437 07-28-2017 0500  Weight: 130 lb 4.7 oz (59.1 kg) 131 lb (59.4 kg) 131 lb 6.3 oz (59.6 kg)    Physical Exam   General: Older than stated age, frail, NAD Skin: Warm, dry, intact  Head: Normocephalic, atraumatic, clear, moist mucus membranes. Neck: Negative for carotid bruits. No JVD Lungs:Clear to ausculation bilaterally. No wheezes, rales, or rhonchi. Breathing is unlabored. Cardiovascular: RRR with S1 S2. No murmurs, rubs or gallops, Abdomen: Soft, non-tender, non-distended with  normoactive bowel sounds. No obvious abdominal masses. MSK: Strength and tone appear normal for age. 5/5 in all extremities Extremities: No edema. No clubbing or cyanosis. DP/PT pulses 2+ bilaterally Neuro: Alert and oriented. No focal deficits. No facial asymmetry. MAE spontaneously. Psych: Responds to questions appropriately with normal affect.    Labs    Chemistry Recent Labs  Lab 07/02/17 0415 07/03/17 0414  07/06/17 0532 07/07/17 0406 July 28, 2017 0654  NA 141 141   < > 143 141 142  K 4.0 3.0*   < > 3.6 3.4* 4.0  CL 100 99   < > 97* 96* 97*  CO2 28 32   < > 38* 35* 35*  GLUCOSE 129* 94   < > 103* 93 93  BUN 44* 44*   < > 20 18 17   CREATININE 1.92* 1.51*   < > 1.08 1.10 1.05  CALCIUM 8.7* 8.3*   < > 8.2* 8.4* 8.6*  PROT 6.0* 5.9*  --   --   --   --   ALBUMIN 3.0* 2.8*  --   --   --   --   AST 1,093* 517*  --   --   --   --   ALT 637* 546*  --   --   --   --  ALKPHOS 165* 147*  --   --   --   --   BILITOT 3.0* 2.4*  --   --   --   --   GFRNONAA 35* 47*   < > >60 >60 >60  GFRAA 41* 55*   < > >60 >60 >60  ANIONGAP 13 10   < > 8 10 10    < > = values in this interval not displayed.     Hematology Recent Labs  Lab 07/04/17 0401 07/05/17 0451 07/06/17 0532  WBC 9.0 7.1 6.6  RBC 4.29 4.34 4.40  HGB 9.9* 9.8* 9.9*  HCT 34.3* 34.8* 35.4*  MCV 80.0 80.2 80.5  MCH 23.1* 22.6* 22.5*  MCHC 28.9* 28.2* 28.0*  RDW 18.6* 18.4* 18.6*  PLT 84* 74* 74*    Cardiac Enzymes Recent Labs  Lab 07/02/17 0415 07/02/17 0843 07/02/17 1403  TROPONINI 0.17* 0.16* 0.16*   No results for input(s): TROPIPOC in the last 168 hours.   BNP Recent Labs  Lab 07/02/17 0415  BNP 2,731.4*     DDimer No results for input(s): DDIMER in the last 168 hours.   Radiology    No results found.  Telemetry    07/27/2017 NSR - Personally Reviewed  ECG    No new tracings as of 08/07/2017 - Personally Reviewed/  Cardiac Studies   Echocardiogram 07/03/2017 Study Conclusions - Left ventricle:  The cavity size was normal. Systolic function was moderately reduced. The estimated ejection fraction was in the range of 35% to 40%. Wall motion was normal; there were no regional wall motion abnormalities. Doppler parameters are consistent with abnormal left ventricular relaxation (grade 1 diastolic dysfunction). - Ventricular septum: Septal motion showed abnormal function and dyssynchrony consistent with bundle branch block. The contour showed diastolic flattening and systolic flattening consistent with right ventricular pressure and volume overload. - Aortic valve: Transvalvular velocity was within the normal range. There was no stenosis. There was no regurgitation. - Mitral valve: Transvalvular velocity was within the normal range. There was no evidence for stenosis. There was mild regurgitation. - Right ventricle: The cavity size was severely dilated. Wall thickness was normal. Systolic function was moderately reduced. - Right atrium: The atrium was mildly dilated. - Tricuspid valve: There was moderate regurgitation. - Pulmonary arteries: Systolic pressure was moderately increased. PA peak pressure: 53 mm Hg (S).  Carotid Dopplers 06/25/2013 -Right internal carotid artery stenosis and less than 40% range -Right external carotid artery stenosis present -Left internal carotid artery stenosis 40-59% range -Left vertebral artery and retrograde with brachial pressure gradient present and monophasic left subclavian artery suggestive of subclavian steal syndrome  Patient Profile     67 y.o. male a hx of severe COPD with chronic respiratory failure on home oxygen, tobacco abuse, alcohol abuse, CAD with 2 previous MIs status post DES x2, hypertension, CKD, carotid artery stenosis, PVD status post several peripheral interventions and renal artery stent in 2004who is being seen for CHF. BNP 2731 and LVEF 35% to 40% by echo 07/03/17.  Assessment & Plan    1. Acute  systolic heart failure: -Echocardiogram with decreased LVEF at 35-40% with an elevated BNP -Pt diuresed with aldactone and Lasix -Weight, 131lb today. Admission weight 150lb on 07/02/17 -I&O, net  Negative 7.6L since admission, 24H total output 355ml  -Continue carvedilol, aldactone -BP low normal>>>follow -GI for fluid management  -Lasix discontinued  2. CAD s/p PCI 2012: -Denies chest pain -EKG with no acute ischemic changes -Trop flat, 0.17>0.16>0.16>>likely in the setting  of acute CHF exacerbation  -ASA, Plavix, BB  3. Possible atrial fibrillation: -AF apparently seen on EKG>>>NSR on telemetry with no recurrence per tele review -No anticoagulation secondary to acute liver failure -Continue BB  4. Hypertension: -Stable, 139/69>110/65>114/70 -Continue current regimen   5. CKD stage II: -Creatinine, 1.05 today.  -Remote baseline appears to be in the 1.0-1.3 range  -Avoid nephrotoxic medications   6. COPD: -Severe, [facility] at [facility], last seen 2017 -Chronic respiratory failure on home supplemental O2 -Per primary team   7. Acute liver failure with ascites: -Remote heavy ETOH use -LFT's elevated on admission>>>AST-517, ALT-546 -INR 1.84>1.60>1.42 -Hb stable  -GI following   Signed, Georgie Chard NP-C [facility] Pager: [phone] 08/04/2017, 9:14 AM     For questions or updates, please contact   Please consult [email or web address] for contact info under Cardiology/STEMI.

## 2017-08-08 NOTE — Discharge Summary (Signed)
[facility]  Physician Discharge Summary   Patient ID: Shaun Schmitt MRN: [id number] DOB/AGE: 1950-12-11 67 y.o.  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 Check CBC, PT/INR, ammonia level and complete metabolic panel on a weekly basis 2. Patient will need follow-up by speech therapy for his dysphagia 3. Can follow-up with gastroenterology in [place] or with Dr. Leone PayorGessner [facility]. 4. Please note patient is on prednisone on a chronic basis for COPD.  He is currently getting stress dosing with quick taper  DISCHARGE CONDITION: fair  Diet recommendation: Full liquid diet with nectar thick liquids  Filed Weights   07/05/17 1700 07/06/17 0437 08-30-2017 0500  Weight: 59.1 kg (130 lb 4.7 oz) 59.4 kg (131 lb) 59.6 kg (131 lb 6.3 oz)    INITIAL HISTORY: 66 y.o.malePMHx COPD on home oxygen 3.5 L,, Emphysema,Tobacco abuse,, CHF, HTN, CAD S/P stents x 2, MI x2, renal artery stenosis, S/P RIGHT renal artery stent, EtOH abuse. [facility] with shortness of breath for about 1 week, worse on exertion, generalized weakness, cough, without fever or chills.[facility], the patient was tachypneic, and had intermittent periods of bradycardia and tachycardia, and anxious.  Consultants:  GI Cardiology Palliative care   Procedures/Significant Events:  CT Abdomen and Pelvis w/o contrast at outside hospital:-Remarkable for and morphologic changes of cirrhosis with moderate to large volume of ascites. Ultrasound of the abdomen outside hospital:-moderate ascites, predominantly perihepatic, mildly echogenic liver parenchyma, likely due to steatosis and or fibrosis, no definite liver  surface irregularity or masses. 6/25 PCXR:Severe emphysema with areas of subpleural scarring. 2. Right lung lucency most likely artifactual. CT may provide better evaluation if clinically indicated. No interval change since the earlier radiograph. 6/26 IR paracentesis: 2.5 L of yellow fluid aspirated 6/26 echocardiogram:- Left ventricle: LVEF = 35% to 40%.-Grade 1 diastolic dysfunction. - Ventricular septum: Septal motion showed abnormal function and dyssynchrony consistent with bundle branch block.  -Right ventricle:  severely dilated.-Tricuspid valve: moderate regurgitation. - Pulmonary arteries: PA peak pressure: 53 mm Hg (S).  CT Chest IMPRESSION: 1. Severe centrilobular emphysema with diffuse bronchial wall thickening, suggesting COPD. No pneumothorax. 2. Small dependent bilateral pleural effusions with mild dependent atelectasis in the lower lobes, right greater than left. 3. Severe postinfectious/postinflammatory scarring in the left upper lobe. 4. No acute consolidative airspace disease, lung masses or significant pulmonary nodules. 5. Small volume upper abdominal ascites. 6. Left main and 3 vessel coronary atherosclerosis.    HOSPITAL COURSE:   Acute liver failure with ascites/ MELD score= 27, 19.6% 4850-month mortality - Suspected to be due to remote Hx EtOH abuse versus right heart failure.  Underwent extensive work-up directed by gastroenterology. Viral hepatitis serologies negative. Alpha 1 AT normal, AMA normal, smooth muscle Ab negative,  IgG 683 (norm700-1600). ANA pending  -6/26 IR paracentesis: 2.5 L fluid aspirated.  No SBP. -He should avoid Tylenol products.   - Echocardiogram consistent with systolic and diastolic CHF, pulmonary HTN.  See results below - Ammonia level has been normal  Coagulopathy -Secondary to liver failure -Normalized with vitamin K 5 mg x 3 doses - Vitamin K 5 mg 2 x /wk   COPD - On 3.5 L O2 at home.  Currently on 4 L O2  via [place]  with SPO2 91% - CXR positive for emphysema see results below - RIGHT lung lucency?  Artifact vs lung nodule.  CT scan shows significant emphysema.  Small pleural effusions noted.  Postinflammatory scarring also noted.  But nothing concerning was identified otherwise.  -Duoneb q 4hr -Dulera 100-5 mcg 2 puff BID He is also on chronic steroids in the form of prednisone.  This was verified with his wife.  He takes 10 mg on a daily basis.  He has not been getting that medication here in the hospital.  He will be given a slightly higher dose and then taper down quickly to 10 mg daily.  Infrarenal 3.2 cm abdominal aortic aneurysm.  -No bleeding issues -Will need to follow with Vascular surgery as outpatient with repeat CT in 6 months   Acute systolic and diastolic CHF (baseline weight unknown) -Strict in and out since admission negative -7.1 L -Echocardiogram consistent with systolic and diastolic CHF see results below.   -Cardiology was consulted.  He appears to be quite stable from their standpoint.  No further work-up needed at this time.  Continue medical management.   - 6/27 Coreg 3.125 mg BID -Spironolactone 100 mg daily: Per GI fluid management Furosemide was discontinued.  Possible atrial fibrillation A. fib was apparently seen on EKG.  Telemetry however shown sinus rhythm.  Few beats of possible A. fib noted.  See cardiology notes.  Continue beta-blocker.  No anticoagulation due to acute liver failure as well as coagulopathy.    Pulmonary HTN -See CHF  Essential HTN -Blood pressure stable  CAD S/P MI 2012/DES -EKG sinus rhythm, with occasional PVCs, incomplete right bundle branch block, right ventricular hypertrophy, QTC 459. Tn 0.14 .Denies CP - Troponins were noted to be elevated but stable.  Patient asymptomatic. -He is on aspirin and Plavix also on beta-blocker.  Normocytic anemia and thrombocytopenia Hemoglobin is stable.  Low platelet counts are due to liver disease.   No evidence for acute bleeding.   Acute kidney injury -Most likely secondary to dehydration -Monitor closely -resolved  Hyperlipidemia -  LDL 64  Depression - Zoloft 100 mg daily  Hypokalemia Potassium has been repleted.  Magnesium 1.8.  Dysphasia - RN staff witnessed what appeared to be several episodes of aspiration.   - 6/28 patient failed swallow evaluation: Fluid consistency nectar thick.  Will need to be followed at the skilled nursing facility.  Goals of care Seen by palliative medicine.  Patient remains full code for now.  Overall stable.  Okay for discharge to skilled nursing facility.    PERTINENT LABS:  The results of significant diagnostics from this hospitalization (including imaging, microbiology, ancillary and laboratory) are listed below for reference.     Labs: Basic Metabolic Panel: Recent Labs  Lab 07/04/17 0401 07/05/17 0451 07/06/17 0532 07/07/17 0406 Jul 26, 2017 0654  NA 143 145 143 141 142  K 3.6 2.9* 3.6 3.4* 4.0  CL 99 96* 97* 96* 97*  CO2 36* 39* 38* 35* 35*  GLUCOSE 85 86 103* 93 93  BUN 30* 19 20 18 17   CREATININE 1.16 1.01 1.08 1.10 1.05  CALCIUM 8.1* 8.5* 8.2* 8.4* 8.6*  MG 1.8 1.9 1.8  --   --    Liver Function Tests: Recent Labs  Lab 07/02/17 0415 07/03/17 0414  AST 1,093* 517*  ALT 637* 546*  ALKPHOS 165* 147*  BILITOT 3.0* 2.4*  PROT 6.0* 5.9*  ALBUMIN 3.0* 2.8*    Recent Labs  Lab 07/02/17 1403 07/04/17  0401 07/05/17 0451 07/06/17 0532  AMMONIA 30 12 20 27    CBC: Recent Labs  Lab 07/02/17 0415 07/03/17 0414 07/04/17 0401 07/05/17 0451 07/06/17 0532  WBC 9.0 12.4* 9.0 7.1 6.6  NEUTROABS 8.4*  --   --   --   --   HGB 10.9* 10.3* 9.9* 9.8* 9.9*  HCT 37.4* 35.4* 34.3* 34.8* 35.4*  MCV 79.1 78.8 80.0 80.2 80.5  PLT 107* 105* 84* 74* 74*   Cardiac Enzymes: Recent Labs  Lab 07/02/17 0415 07/02/17 0843 07/02/17 1403  TROPONINI 0.17* 0.16* 0.16*   BNP: BNP (last 3 results) Recent Labs     07/02/17 0415  BNP 2,731.4*     IMAGING STUDIES Ct Chest Wo Contrast  Result Date: 07/05/2017 CLINICAL DATA:  Inpatient. COPD. Indeterminate right lung lucency on recent chest radiograph. EXAM: CT CHEST WITHOUT CONTRAST TECHNIQUE: Multidetector CT imaging of the chest was performed following the standard protocol without IV contrast. COMPARISON:  07/02/2017 chest radiograph.  12/08/2014 chest CT. FINDINGS: Cardiovascular: Normal heart size. Small pericardial effusion/thickening. Left main and 3 vessel coronary atherosclerosis. Atherosclerotic nonaneurysmal thoracic aorta. Stable top-normal main pulmonary artery. Mediastinum/Nodes: No discrete thyroid nodules. Unremarkable esophagus. No pathologically enlarged axillary, mediastinal or hilar lymph nodes, noting limited sensitivity for the detection of hilar adenopathy on this noncontrast study. Lungs/Pleura: No pneumothorax. Small dependent bilateral pleural effusions, right greater than left. Mild dependent atelectasis in the right greater than left lower lobes. Severe centrilobular emphysema with diffuse bronchial wall thickening. Thick parenchymal band with associated prominent volume loss, bronchiectasis and distortion in the left upper lobe, compatible with postinfectious/postinflammatory scarring. No acute consolidative airspace disease, lung masses or significant pulmonary nodules. Upper abdomen: Small volume upper abdominal ascites, predominantly perihepatic. Musculoskeletal: No aggressive appearing focal osseous lesions. Moderate thoracic spondylosis. IMPRESSION: 1. Severe centrilobular emphysema with diffuse bronchial wall thickening, suggesting COPD. No pneumothorax. 2. Small dependent bilateral pleural effusions with mild dependent atelectasis in the lower lobes, right greater than left. 3. Severe postinfectious/postinflammatory scarring in the left upper lobe. 4. No acute consolidative airspace disease, lung masses or significant pulmonary  nodules. 5. Small volume upper abdominal ascites. 6. Left main and 3 vessel coronary atherosclerosis. Aortic Atherosclerosis (ICD10-I70.0) and Emphysema (ICD10-J43.9). Electronically Signed   By: Delbert Phenix M.D.   On: 07/05/2017 21:07   Dg Chest Port 1 View  Result Date: 07/02/2017 CLINICAL DATA:  67 year old male with shortness of breath.  COPD. EXAM: PORTABLE CHEST 1 VIEW COMPARISON:  Chest radiograph dated 07/01/2017 FINDINGS: There is emphysematous changes of the lungs. There is an area of pleural thickening and nodularity in the left upper lobe likely related to underlying scarring. This is similar to studies dating back to 2016. Right costophrenic angle density may represent scarring or trace pleural effusion. No definite pneumothorax. Apparent linear lucency along the peripheral right lung likely related to skin fold artifacts as lung parenchyma appear to extend beyond this line. If there is clinical concern for pneumothorax further evaluation with CT may provide better evaluation. Stable cardiac silhouette. There is traction of the upper mediastinum to the left secondary to scarring. No acute osseous pathology. IMPRESSION: 1. Severe emphysema with areas of subpleural scarring. 2. Right lung lucency most likely artifactual. CT may provide better evaluation if clinically indicated. No interval change since the earlier radiograph. Electronically Signed   By: Elgie Collard M.D.   On: 07/02/2017 04:20   Dg Swallowing Func-speech Pathology  Result Date: 07/04/2017 Objective Swallowing Evaluation: Type of Study: MBS-Modified Barium Swallow Study  Patient Details Name: JAKAIDEN FILL MRN: [id number] Date of Birth: 03-07-1950 Today's Date: 07/04/2017 Time: SLP Start Time (ACUTE ONLY): 1330 -SLP Stop Time (ACUTE ONLY): 1400 SLP Time Calculation (min) (ACUTE ONLY): 30 min Past Medical History: Past Medical History: Diagnosis Date . 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 Arthritis   Gout . Bronchitis April 23, 2012 . CAD (coronary artery disease) 12/28/2014  Stents x2 on ASA and Plavix . Chronic respiratory failure with hypoxia (HCC) 02/16/2015 . COPD, very severe (HCC) 12/28/2014 . Elevated LFTs 06/2017 . History of tobacco abuse 02/16/2015 . Hypertension  . Occlusion and stenosis of carotid artery without mention of cerebral infarction 06/25/2013 . Pain in both knees   Right worse , bilateral Hips and ankles, numbness  . Pancytopenia (HCC) 12/28/2014 . Peripheral edema 06/26/2017 . Protein-calorie malnutrition, mild (HCC) 12/28/2014 . Pulmonary nodule/lesion, solitary 12/28/2014  RUL 8 mm nodule on OSH CT chest 11/27/14 . PVD (peripheral vascular disease) (HCC) 09/25/2011 . Renal artery stenosis (HCC) 12/07/2002  renal artery stenting 2004 and found right renal artery stent occlusion on angiography in 2013.   Marland Kitchen Rhinovirus infection 12/30/2014 Past Surgical History: Past Surgical History: Procedure Laterality Date . ABDOMINAL AORTAGRAM N/A 10/31/2011  Procedure: ABDOMINAL AORTAGRAM;  Surgeon: Larina Earthly, MD;  Location: [facility] CATH LAB;  Service: Cardiovascular;  Laterality: N/A; . CARDIAC CATHETERIZATION    cath 05/21/00 ([facility]) showed 50% ostial OM, 25-30% mRCA, EF 40-45%) . IR PARACENTESIS  07/03/2017 . JOINT REPLACEMENT  2005  Right Hip . KNEE SURGERY   . PATCH ANGIOPLASTY  11/26/2011  Procedure: PATCH ANGIOPLASTY;  Surgeon: Larina Earthly, MD;  Location: [facility] OR;  Service: Vascular;  Laterality: Right;  Using Hemashield 0.8cm x 7.6 cm vascular patch. . PR VEIN BYPASS GRAFT,AORTO-FEM-POP   . REPAIR ILIAC ARTERY  11/26/2011  Procedure: REPAIR ILIAC ARTERY;  Surgeon: Larina Earthly, MD;  Location: [facility] OR;  Service: Vascular;  Laterality: Right;  With patch angioplasty. HPI: Pt is a 67 y.o. male who presented to [facility] with SOB x1 week, found to have acute liver failure with ascites. Pt was noted to cough after drinking liquids, therefore he was made NPO  and swallow evaluation was ordered. PMH: COPD on home oxygen 3.5 L, Emphysema, Tobacco abuse, CHF, HTN, CAD S/P stents x 2, MI x2, renal artery stenosis, S/P RIGHT renal artery stent, EtOH abuse. Pt had previous swallow testing at [facility] in 2016 after intubation, advanced to Dys 3 diet and nectar thick liquids with a chin tuck. ENT assessed for possible vocal cord injection due to concern for paralysis but they did not feel this was necessary on an inpatient basis.  Subjective: pt describes trouble swallowing previously, has had PNA, but has trouble recalling specific details Assessment / Plan / Recommendation CHL IP CLINICAL IMPRESSIONS 07/04/2017 Clinical Impression Pt presenting with a moderate oral, severe pharyngeal dysphagia, suspect chronic. Oral phase characterized by piecemeal swallowing and delayed oral transit along with premature spillage of liquid consistencies; pharyngeal phase showed decreased anterior laryngeal movement/ reduced epiglottic deflection and reduced airway closure resulting in frank, silent aspiration of thin liquids before the swallow, penetration of nectar thick liquids before swallow and aspiration post-swallow which appeared to be from vallecular residuals. Cued cough was ineffective in clearing aspirated material. Pt with persistent moderate residuals in the vallecula and pyriform sinuses; when cued to swallow a 2nd time pt reported he did not have enough saliva to swallow.  Pt did tolerate nectar-thick liquids by teaspoon with only flash penetration which cleared some of the puree material residuals. Chin tuck and head turn were attempted but unfortunately did not appear effective in prevention of material entering airway or residuals. For now, recommend full liquid diet with liquids thickened to nectar by teaspoon only. Will appreciate palliative consult as it seems pt does not desire altered diet; states "I think I'm nearing the end and this isn't how I wanted it to go" and has not  been compliant with thickened liquids in the past. May be a good candidate for [employer] free water protocol. Will continue to follow briefly for diet tolerance/ education.  SLP Visit Diagnosis Dysphagia, oropharyngeal phase (R13.12) Attention and concentration deficit following -- Frontal lobe and executive function deficit following -- Impact on safety and function Severe aspiration risk   CHL IP TREATMENT RECOMMENDATION 07/04/2017 Treatment Recommendations Therapy as outlined in treatment plan below   Prognosis 07/04/2017 Prognosis for Safe Diet Advancement Guarded Barriers to Reach Goals Time post onset;Severity of deficits Barriers/Prognosis Comment -- CHL IP DIET RECOMMENDATION 07/04/2017 SLP Diet Recommendations Nectar thick liquid;Other (Comment) Liquid Administration via Spoon Medication Administration Crushed with puree Compensations Slow rate;Small sips/bites Postural Changes Remain semi-upright after after feeds/meals (Comment);Seated upright at 90 degrees   CHL IP OTHER RECOMMENDATIONS 07/04/2017 Recommended Consults (No Data) Oral Care Recommendations Oral care before and after PO Other Recommendations Clarify dietary restrictions   CHL IP FOLLOW UP RECOMMENDATIONS 07/04/2017 Follow up Recommendations Other (comment)   CHL IP FREQUENCY AND DURATION 07/04/2017 Speech Therapy Frequency (ACUTE ONLY) min 1 x/week Treatment Duration 1 week      CHL IP ORAL PHASE 07/04/2017 Oral Phase Impaired Oral - Pudding Teaspoon -- Oral - Pudding Cup -- Oral - Honey Teaspoon -- Oral - Honey Cup -- Oral - Nectar Teaspoon Delayed oral transit;Premature spillage Oral - Nectar Cup Piecemeal swallowing;Delayed oral transit;Decreased bolus cohesion;Premature spillage Oral - Nectar Straw -- Oral - Thin Teaspoon -- Oral - Thin Cup Piecemeal swallowing;Delayed oral transit;Decreased bolus cohesion;Premature spillage Oral - Thin Straw -- Oral - Puree Premature spillage Oral - Mech Soft -- Oral - Regular -- Oral - Multi-Consistency -- Oral  - Pill -- Oral Phase - Comment --  CHL IP PHARYNGEAL PHASE 07/04/2017 Pharyngeal Phase Impaired Pharyngeal- Pudding Teaspoon -- Pharyngeal -- Pharyngeal- Pudding Cup -- Pharyngeal -- Pharyngeal- Honey Teaspoon -- Pharyngeal -- Pharyngeal- Honey Cup -- Pharyngeal -- Pharyngeal- Nectar Teaspoon Delayed swallow initiation-pyriform sinuses;Reduced anterior laryngeal mobility;Reduced airway/laryngeal closure;Penetration/Aspiration during swallow;Pharyngeal residue - valleculae Pharyngeal Material enters airway, remains ABOVE vocal cords then ejected out Pharyngeal- Nectar Cup Delayed swallow initiation-pyriform sinuses;Reduced anterior laryngeal mobility;Reduced airway/laryngeal closure;Penetration/Apiration after swallow;Pharyngeal residue - valleculae;Pharyngeal residue - pyriform Pharyngeal Material enters airway, passes BELOW cords without attempt by patient to eject out (silent aspiration) Pharyngeal- Nectar Straw -- Pharyngeal -- Pharyngeal- Thin Teaspoon -- Pharyngeal -- Pharyngeal- Thin Cup Reduced anterior laryngeal mobility;Reduced airway/laryngeal closure;Penetration/Aspiration before swallow;Pharyngeal residue - valleculae;Pharyngeal residue - pyriform Pharyngeal Material enters airway, passes BELOW cords without attempt by patient to eject out (silent aspiration) Pharyngeal- Thin Straw -- Pharyngeal -- Pharyngeal- Puree Reduced epiglottic inversion;Reduced anterior laryngeal mobility;Pharyngeal residue - valleculae;Pharyngeal residue - pyriform Pharyngeal -- Pharyngeal- Mechanical Soft -- Pharyngeal -- Pharyngeal- Regular -- Pharyngeal -- Pharyngeal- Multi-consistency -- Pharyngeal -- Pharyngeal- Pill -- Pharyngeal -- Pharyngeal Comment --  CHL IP CERVICAL ESOPHAGEAL PHASE 07/04/2017 Cervical Esophageal Phase WFL Pudding Teaspoon -- Pudding Cup -- Honey Teaspoon -- Honey Cup -- Nectar Teaspoon -- Nectar Cup --  Nectar Straw -- Thin Teaspoon -- Thin Cup -- Thin Straw -- Puree -- Mechanical Soft -- Regular --  Multi-consistency -- Pill -- Cervical Esophageal Comment -- No flowsheet data found. Amy Cecille Aver, MA, CCC-SLP 07/04/2017, 3:04 PM [id number]              Ir Paracentesis  Result Date: 07/03/2017 INDICATION: Cirrhosis by imaging, CHF, acute renal failure, ascites; request received for diagnostic and therapeutic paracentesis. EXAM: ULTRASOUND GUIDED DIAGNOSTIC AND THERAPEUTIC PARACENTESIS MEDICATIONS: None COMPLICATIONS: None immediate. PROCEDURE: Informed written consent was obtained from the patient after a discussion of the risks, benefits and alternatives to treatment. A timeout was performed prior to the initiation of the procedure. Initial ultrasound scanning demonstrates a moderate amount of ascites within the right mid to lower abdominal quadrant. The right mid to lower abdomen was prepped and draped in the usual sterile fashion. 1% lidocaine with epinephrine was used for local anesthesia. Following this, a 19 gauge, 7-cm, Yueh catheter was introduced. An ultrasound image was saved for documentation purposes. The paracentesis was performed. The catheter was removed and a dressing was applied. The patient tolerated the procedure well without immediate post procedural complication. FINDINGS: A total of approximately 2.5 liters of yellow fluid was removed. Samples were sent to the laboratory as requested by the clinical team. IMPRESSION: Successful ultrasound-guided diagnostic and therapeutic paracentesis yielding 2.5 liters of peritoneal fluid. Read by: Jeananne Rama, PA-C Electronically Signed   By: Gilmer Mor D.O.   On: 07/03/2017 12:34    DISCHARGE EXAMINATION: Vitals:   07/07/17 2134 08/03/2017 0500 07/16/2017 0524 07/17/2017 0851  BP: 110/65  139/69   Pulse: 67  76   Resp: 18  18   Temp: 98.1 F (36.7 C)  97.7 F (36.5 C)   TempSrc:   Oral   SpO2: 94%  95% 90%  Weight:  59.6 kg (131 lb 6.3 oz)    Height:       General appearance: alert, cooperative, appears stated age and no distress Resp:  clear to auscultation bilaterally Cardio: regular rate and rhythm, S1, S2 normal, no murmur, click, rub or gallop GI: soft, non-tender; bowel sounds normal; no masses,  no organomegaly  DISPOSITION: Skilled nursing facility  Discharge Instructions    Call MD for:  extreme fatigue   Complete by:  As directed    Call MD for:  persistant dizziness or light-headedness   Complete by:  As directed    Call MD for:  persistant nausea and vomiting   Complete by:  As directed    Call MD for:  severe uncontrolled pain   Complete by:  As directed    Call MD for:  temperature >100.4   Complete by:  As directed    Discharge instructions   Complete by:  As directed    Please review instructions on the discharge summary.  You were cared for by a hospitalist during your hospital stay. If you have any questions about your discharge medications or the care you received while you were in the hospital after you are discharged, you can call the unit and asked to speak with the hospitalist on call if the hospitalist that took care of you is not available. Once you are discharged, your primary care physician will handle any further medical issues. Please note that NO REFILLS for any discharge medications will be authorized once you are discharged, as it is imperative that you return to your primary care physician (or establish a relationship with a  primary care physician if you do not have one) for your aftercare needs so that they can reassess your need for medications and monitor your lab values. If you do not have a primary care physician, you can call [phone] for a physician referral.   Increase activity slowly   Complete by:  As directed        Allergies as of 2017-08-02      Reactions   Codeine Nausea And Vomiting   Upset stomach      Medication List    STOP taking these medications   cetirizine 10 MG tablet Commonly known as:  ZYRTEC   furosemide 20 MG tablet Commonly known as:  LASIX     guaiFENesin 600 MG 12 hr tablet Commonly known as:  MUCINEX   Melatonin 3 MG Tabs   metoprolol tartrate 25 MG tablet Commonly known as:  LOPRESSOR   omeprazole 20 MG capsule Commonly known as:  PRILOSEC   pravastatin 40 MG tablet Commonly known as:  PRAVACHOL   traMADol 50 MG tablet Commonly known as:  ULTRAM     TAKE these medications   ADVAIR DISKUS 250-50 MCG/DOSE Aepb Generic drug:  Fluticasone-Salmeterol Inhale 1 puff into the lungs every 12 (twelve) hours.   ALPRAZolam 0.25 MG tablet Commonly known as:  XANAX Take 1 tablet (0.25 mg total) by mouth 2 (two) times daily as needed for anxiety. 0.125mg  in the morning and 0.25mg  in the evening What changed:    how much to take  when to take this  reasons to take this   antiseptic oral rinse Liqd 15 mLs by Mouth Rinse route as needed for dry mouth. Biotene Mouth Spray   aspirin 81 MG tablet Take 81 mg by mouth daily.   carvedilol 3.125 MG tablet Commonly known as:  COREG Take 1 tablet (3.125 mg total) by mouth 2 (two) times daily with a meal.   cholecalciferol 1000 units tablet Commonly known as:  VITAMIN D Take 1,000 Units by mouth every morning.   clopidogrel 75 MG tablet Commonly known as:  PLAVIX Take 75 mg by mouth every evening.   COMBIVENT RESPIMAT 20-100 MCG/ACT Aers respimat Generic drug:  Ipratropium-Albuterol Inhale 2 puffs into the lungs every 6 (six) hours as needed. For shortness of breath What changed:  Another medication with the same name was changed. Make sure you understand how and when to take each.   ipratropium-albuterol 0.5-2.5 (3) MG/3ML Soln Commonly known as:  DUONEB Take 3 mLs by nebulization every 4 (four) hours as needed (for wheezing). What changed:    when to take this  reasons to take this   HYDROcodone-acetaminophen 5-325 MG tablet Commonly known as:  NORCO/VICODIN Take 1-2 tablets by mouth every 4 (four) hours as needed for moderate pain.   pantoprazole 40 MG  tablet Commonly known as:  PROTONIX Take 1 tablet (40 mg total) by mouth daily at 6 (six) AM.   phytonadione 5 MG tablet Commonly known as:  VITAMIN K Take 1 tablet (5 mg total) by mouth 2 (two) times a week.   predniSONE 10 MG tablet Commonly known as:  DELTASONE Take 60 mg once daily for 3 days, followed by 40 mg once daily for 3 days, followed by 20 mg once daily for 3 days, followed by 10 mg once daily indefinitely as before. What changed:    how much to take  how to take this  when to take this  additional instructions   prenatal multivitamin Tabs tablet  Take 1 tablet by mouth daily at 12 noon.   PROBIOTIC-10 PO Take 1 capsule by mouth every morning.   senna-docusate 8.6-50 MG tablet Commonly known as:  Senokot-S Take 1 tablet by mouth at bedtime as needed for mild constipation.   sertraline 100 MG tablet Commonly known as:  ZOLOFT Take 200 mg by mouth daily.   SPIRIVA HANDIHALER 18 MCG inhalation capsule Generic drug:  tiotropium Place 1 capsule into inhaler and inhale every morning.   spironolactone 100 MG tablet Commonly known as:  ALDACTONE Take 1 tablet (100 mg total) by mouth daily.        Contact information for after-discharge care    Destination    [facility] .   Service:  Skilled Nursing Contact information: [address] [place] [address] [phone]              TOTAL DISCHARGE TIME: 35 minutes  Osvaldo Shipper  [facility] Pager [phone]  07/24/2017, 11:13 AM

## 2017-08-08 NOTE — Consult Note (Signed)
[facility] Primary Care Navigator  2017/07/12  ALERIC FROELICH [birth year] [id number]   Met withpatientand daughter Edwena Blow) at the bedsidetoidentify possible discharge needs.  Daughter reports that patient was having difficulty breathing and abdominal swelling that had led to this admission.  PatientendorsesDr.Jason Nona Dell with [facility] ashisprimary care provider.   [facility] [place].  Patientverbalized thatwife Suanne Marker) has beenmanaging hismedications at home using "pillbox" system filled daily.  Patient's daughter states that wife has been providing transportationtohisdoctors' appointments.  Patientlives with wife who serves as his primarycaregiverat home.  Anticipated plan for discharge is skilled nursing facility (SNF- [facility]) for rehabilitationper therapy recommendation.  Patientvoiced understandingto callprimarycareprovider'soffice if hereturns backhome,for a post discharge follow-upvisitwithin1- 2 weeksor sooner if needs arise.Patient letter (with PCP's contact number) was provided asareminder.   Explained topatientand daughter regarding [facility] CM services available for health management/ resourcesat home.Patient is aware to talk with primary care provider on his next visit (after facility discharge) about further needs in managinghishealthconditions(HF/ COPD) and need for further [facility] services when dischargeback home. Patientverbalizedunderstandingto seekreferral from primary care provider to [facility] care management ifdeemed necessary and appropriatefor furtherservicesin the future-oncedischargehome.   [facility] care management information was provided for futureneeds thatpatient may have.   For additional questions please contact:  Edwena Felty A. Merville Hijazi, BSN, RN-BC [facility] PRIMARY CARE Navigator Cell: [phone]

## 2017-08-08 NOTE — Clinical Social Work Placement (Signed)
   CLINICAL SOCIAL WORK PLACEMENT  NOTE  Date:  07/12/2017  Patient Details  Name: Shaun Schmitt MRN: [id number] Date of Birth: January 16, 1950  Clinical Social Work is seeking post-discharge placement for this patient at the Skilled  Nursing Facility level of care (*CSW will initial, date and re-position this form in  chart as items are completed):  Yes   Patient/family provided with [facility] Clinical Social Work Department's list of facilities offering this level of care within the geographic area requested by the patient (or if unable, by the patient's family).  Yes   Patient/family informed of their freedom to choose among providers that offer the needed level of care, that participate in Medicare, Medicaid or managed care program needed by the patient, have an available bed and are willing to accept the patient.  Yes   Patient/family informed of [facility] ownership interest [facility] and [facility], as well as of the fact that they are under no obligation to receive care at these facilities.  PASRR submitted to EDS on 07/04/17     PASRR number received on       Existing PASRR number confirmed on 07/04/17     FL2 transmitted to all facilities in geographic area requested by pt/family on 07/04/17     FL2 transmitted to all facilities within larger geographic area on       Patient informed that his/her managed care company has contracts with or will negotiate with certain facilities, including the following:        Yes   Patient/family informed of bed offers received.  Patient chooses bed at [facility]     Physician recommends and patient chooses bed at      Patient to be transferred to [facility] on 08/05/2017.  Patient to be transferred to facility by PTAR     Patient family notified on  of transfer.  Name of family member notified:  Shaun Schmitt, spouse     PHYSICIAN Please sign FL2     Additional Comment:     _______________________________________________ Mearl LatinNadia S Amit Meloy, LCSWA 07/21/2017, 11:12 AM

## 2017-08-08 NOTE — Progress Notes (Signed)
Patient will DC to: [facility] Anticipated DC date: 08/03/2017 Family notified: Spouse, Administrator, Civil Servicehonda Transport by: PTAR 1:30pm  Spouse requests patient have xanax before transport due to anxiety.    Per MD patient [facility]. RN, patient, patient's family, and facility notified of DC. Discharge Summary sent to facility. RN given number for report [phone]). DC packet on chart. Ambulance transport requested for patient.   CSW signing off.  Cristobal GoldmannNadia Darrel Baroni, LCSW Clinical Social Worker [phone]

## 2017-08-08 NOTE — Progress Notes (Signed)
Patient's spouse notified that CSW still unable to reach [facility] (their admission liaison on vacation). [facility] able to accept patient today. Spouse in agreement and requested patient be allowed to eat soft foods since he does not like liquid diet. MD notified.   Osborne Cascoadia Vayden Weinand LCSW [phone]

## 2017-08-08 NOTE — Care Management Note (Signed)
Case Management Note  Patient Details  Name: Shaun EgeJames T Schmitt MRN: [id number] Date of Birth: 01-Jan-1951  Subjective/Objective:       Acute liver failure            Lowella FairyRhonda Galli (Spouse)     [phone]       PCP: Crist FatJason Van Eyk  Action/Plan: Transition to SNF today. CSW managing disposition to facility. No needs identified per NCM.  Expected Discharge Date:  07/07/17               Expected Discharge Plan:  Skilled Nursing Facility  In-House Referral:  Clinical Social Work  Discharge planning Services  CM Consult  Status of Service:  Completed, signed off  If discussed at [employer] Length of Stay Meetings, dates discussed:    Additional Comments:  Epifanio LeschesCole, Kaemon Barnett Hudson, RN , 10:21 AM

## 2017-08-08 NOTE — Progress Notes (Signed)
sg Discharge Note  Admit Date:  07/02/2017 Discharge date: 07/28/2017   Margie EgeJames T Degante to be D/C'd [facility] per MD order.  AVS completed.  Copy for chart, and copy for patient signed, and dated. Patient/caregiver able to verbalize understanding.  Discharge Medication: Allergies as of 07/30/2017      Reactions   Codeine Nausea And Vomiting   Upset stomach      Medication List    STOP taking these medications   cetirizine 10 MG tablet Commonly known as:  ZYRTEC   furosemide 20 MG tablet Commonly known as:  LASIX   guaiFENesin 600 MG 12 hr tablet Commonly known as:  MUCINEX   Melatonin 3 MG Tabs   metoprolol tartrate 25 MG tablet Commonly known as:  LOPRESSOR   omeprazole 20 MG capsule Commonly known as:  PRILOSEC   pravastatin 40 MG tablet Commonly known as:  PRAVACHOL   traMADol 50 MG tablet Commonly known as:  ULTRAM     TAKE these medications   ADVAIR DISKUS 250-50 MCG/DOSE Aepb Generic drug:  Fluticasone-Salmeterol Inhale 1 puff into the lungs every 12 (twelve) hours.   ALPRAZolam 0.25 MG tablet Commonly known as:  XANAX Take 1 tablet (0.25 mg total) by mouth 2 (two) times daily as needed for anxiety. 0.125mg  in the morning and 0.25mg  in the evening What changed:    how much to take  when to take this  reasons to take this   antiseptic oral rinse Liqd 15 mLs by Mouth Rinse route as needed for dry mouth. Biotene Mouth Spray   aspirin 81 MG tablet Take 81 mg by mouth daily.   carvedilol 3.125 MG tablet Commonly known as:  COREG Take 1 tablet (3.125 mg total) by mouth 2 (two) times daily with a meal.   cholecalciferol 1000 units tablet Commonly known as:  VITAMIN D Take 1,000 Units by mouth every morning.   clopidogrel 75 MG tablet Commonly known as:  PLAVIX Take 75 mg by mouth every evening.   COMBIVENT RESPIMAT 20-100 MCG/ACT Aers respimat Generic drug:  Ipratropium-Albuterol Inhale 2 puffs into  the lungs every 6 (six) hours as needed. For shortness of breath What changed:  Another medication with the same name was changed. Make sure you understand how and when to take each.   ipratropium-albuterol 0.5-2.5 (3) MG/3ML Soln Commonly known as:  DUONEB Take 3 mLs by nebulization every 4 (four) hours as needed (for wheezing). What changed:    when to take this  reasons to take this   HYDROcodone-acetaminophen 5-325 MG tablet Commonly known as:  NORCO/VICODIN Take 1-2 tablets by mouth every 4 (four) hours as needed for moderate pain.   pantoprazole 40 MG tablet Commonly known as:  PROTONIX Take 1 tablet (40 mg total) by mouth daily at 6 (six) AM.   phytonadione 5 MG tablet Commonly known as:  VITAMIN K Take 1 tablet (5 mg total) by mouth 2 (two) times a week.   predniSONE 10 MG tablet Commonly known as:  DELTASONE Take 60 mg once daily for 3 days, followed by 40 mg once daily for 3 days, followed by 20 mg once daily for 3 days, followed by 10 mg once daily indefinitely as before. What changed:    how much to take  how to take this  when to take this  additional instructions   prenatal multivitamin Tabs tablet Take 1 tablet by mouth daily at 12 noon.   PROBIOTIC-10 PO Take  1 capsule by mouth every morning.   senna-docusate 8.6-50 MG tablet Commonly known as:  Senokot-S Take 1 tablet by mouth at bedtime as needed for mild constipation.   sertraline 100 MG tablet Commonly known as:  ZOLOFT Take 200 mg by mouth daily.   SPIRIVA HANDIHALER 18 MCG inhalation capsule Generic drug:  tiotropium Place 1 capsule into inhaler and inhale every morning.   spironolactone 100 MG tablet Commonly known as:  ALDACTONE Take 1 tablet (100 mg total) by mouth daily.       Discharge Assessment: Vitals:   07/14/2017 0524 07/13/2017 0851  BP: 139/69   Pulse: 76   Resp: 18   Temp: 97.7 F (36.5 C)   SpO2: 95% 90%   Skin clean, dry and intact, pt had dusky forearms and hands  with multiple bruises and minor skin tears on bilateral forearms.. IV catheter discontinued intact. Site without signs and symptoms of complications - no redness or edema noted at insertion site, patient denies c/o pain - only slight tenderness at site.  Dressing with slight pressure applied.  D/c Instructions-Education: Discharge instructions given to PTAR with verbalized understanding. D/c education completed with patient/family including follow up instructions, medication list, d/c activities limitations if indicated, with other d/c instructions as indicated by MD - patient able to verbalize understanding, all questions fully answered. Patient instructed to return to ED, call 911, or call MD for any changes in condition.  Patient escorted via WC, and D/C home via private auto.  Eddie Dibbles, RN 07/29/2017 2:03 PM

## 2017-08-08 NOTE — Progress Notes (Signed)
Nurse suggested this patient might appreciate a visit.  He was sleeping-I met one of his children-a daughter.  She advised he is not a believer and would not want prayer.  She did say pray but not in here with him. Shaun Schmitt, Chaplain   07/22/17 1100  Clinical Encounter Type  Visited With Family;Other (Comment) (daughter)  Visit Type Initial  Referral From Nurse  Consult/Referral To [place] (Comment) (daughter said he would not want prayer )  Stress Factors  Patient Stress Factors Major life changes (going to [facility] today)

## 2017-08-08 DEATH — deceased

## 2020-01-17 IMAGING — DX DG CHEST 1V PORT
1 series · 2 of 2 positions shown · non-contrast
Comparison: Chest radiograph dated 07/01/2017

CLINICAL DATA: 66-year-old male with shortness of breath.  COPD.

EXAM:
PORTABLE CHEST 1 VIEW

[Series 1: chest · 0.14mm/px · 2 of 2 slices shown]
[im 1/2]
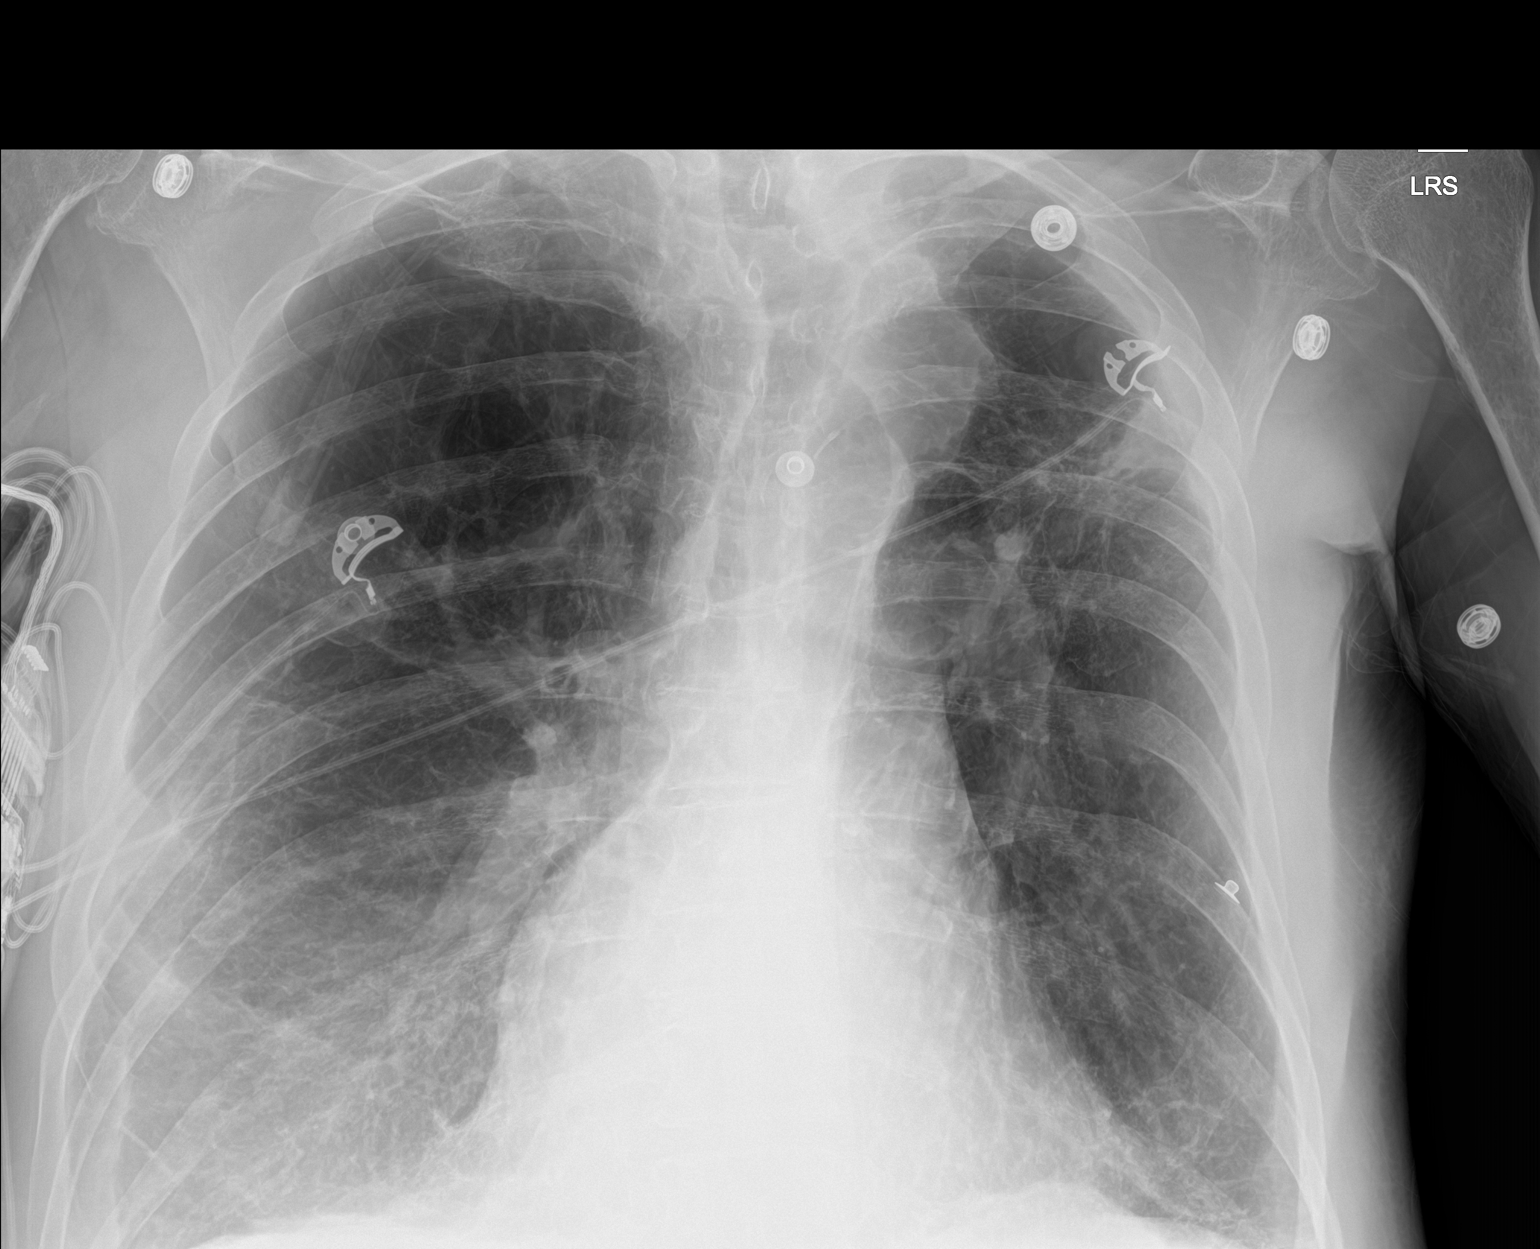
[im 2/2]
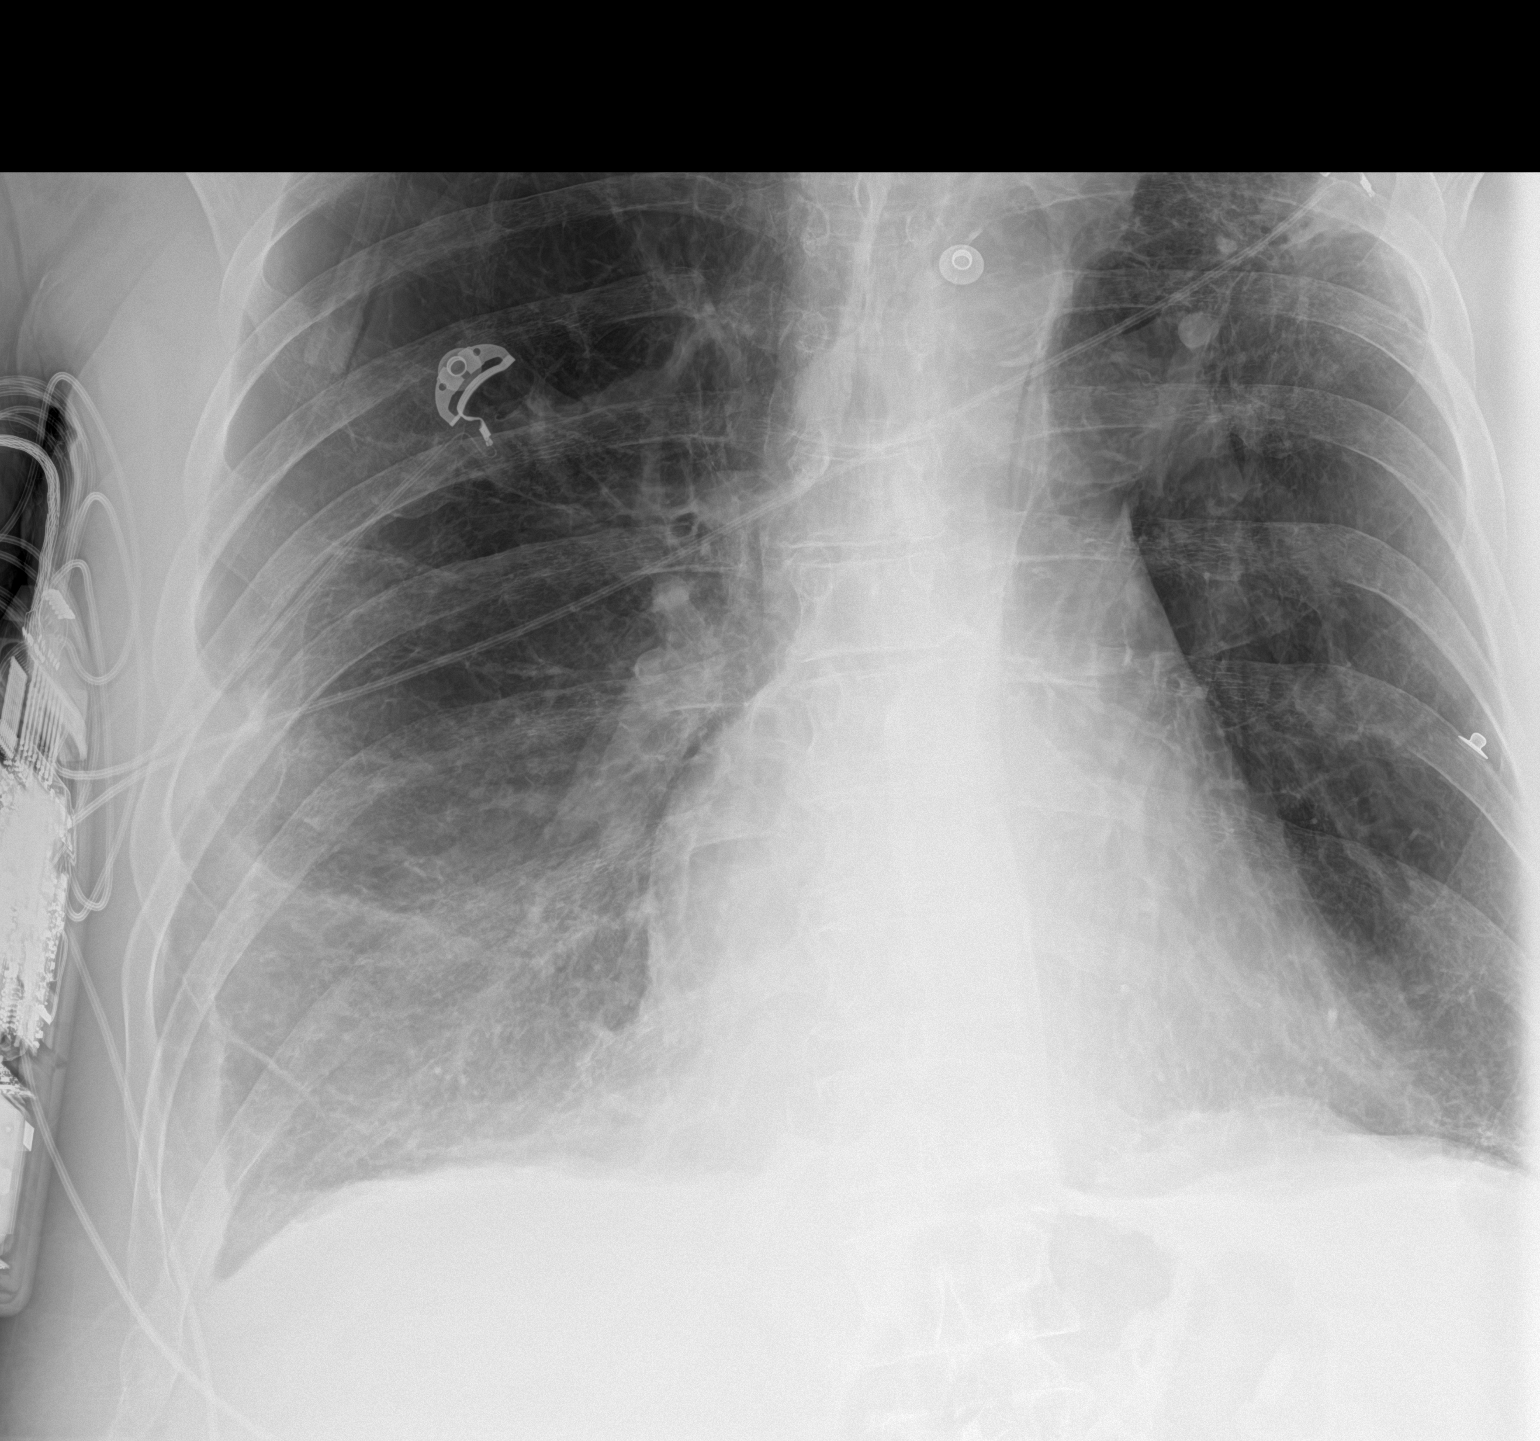

[2 of 2 positions shown; findings below may reference images not displayed]

FINDINGS: There is emphysematous changes of the lungs. There is an area of
pleural thickening and nodularity in the left upper lobe likely
related to underlying scarring. This is similar to studies dating
back to 2233. Right costophrenic angle density may represent
scarring or trace pleural effusion. No definite pneumothorax.
Apparent linear lucency along the peripheral right lung likely
related to skin fold artifacts as lung parenchyma appear to extend
beyond this line. If there is clinical concern for pneumothorax
further evaluation with CT may provide better evaluation. Stable
cardiac silhouette. There is traction of the upper mediastinum to
the left secondary to scarring. No acute osseous pathology.
IMPRESSION: 1. Severe emphysema with areas of subpleural scarring.
2. Right lung lucency most likely artifactual. CT may provide better
evaluation if clinically indicated. No interval change since the
earlier radiograph.

## 2020-01-18 IMAGING — US IR PARACENTESIS
1 series · 3 of 3 positions shown · non-contrast
Comparison: none

INDICATION: Cirrhosis by imaging, CHF, acute renal failure, ascites; request
received for diagnostic and therapeutic paracentesis.

[Series 1: ir (id) (id)/(id)/(id) ir · 3 of 3 slices shown]
[im 1/3]
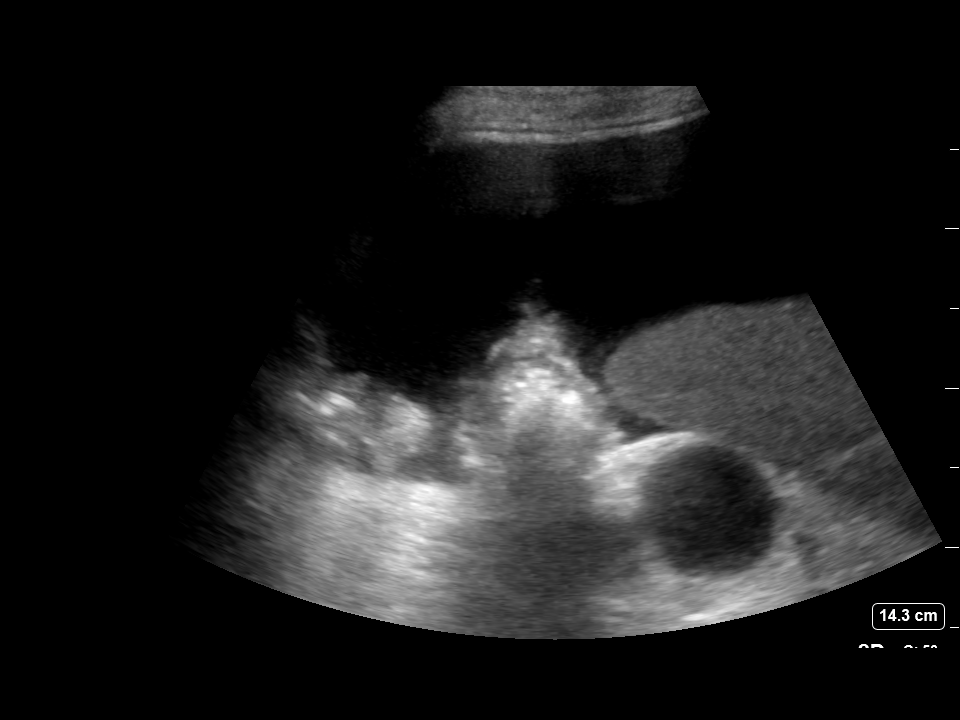
[im 2/3]
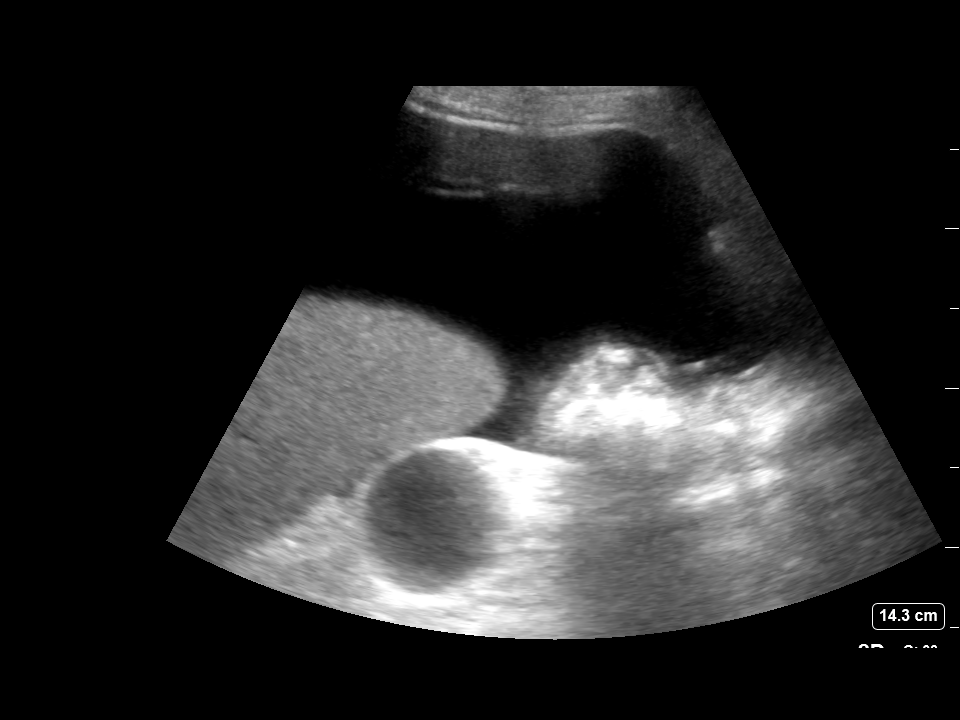
[im 3/3]
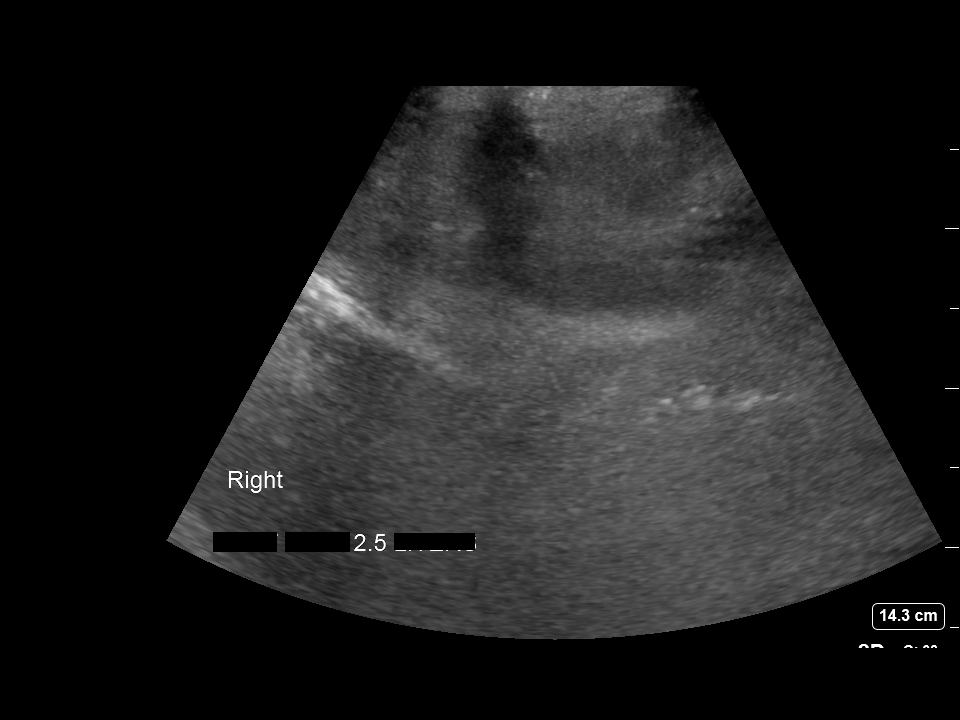

[3 of 3 positions shown; findings below may reference images not displayed]

EXAM:
ULTRASOUND GUIDED DIAGNOSTIC AND THERAPEUTIC PARACENTESIS

MEDICATIONS:
None

COMPLICATIONS:
None immediate.

PROCEDURE:
Informed written consent was obtained from the patient after a
discussion of the risks, benefits and alternatives to treatment. A
timeout was performed prior to the initiation of the procedure.

Initial ultrasound scanning demonstrates a moderate amount of
ascites within the right mid to lower abdominal quadrant. The right
mid to lower abdomen was prepped and draped in the usual sterile
fashion. 1% lidocaine with epinephrine was used for local
anesthesia.

Following this, a 19 gauge, 7-cm, Yueh catheter was introduced. An
ultrasound image was saved for documentation purposes. The
paracentesis was performed. The catheter was removed and a dressing
was applied. The patient tolerated the procedure well without
immediate post procedural complication.
FINDINGS: A total of approximately 2.5 liters of yellow fluid was removed.
Samples were sent to the laboratory as requested by the clinical
team.
IMPRESSION: Successful ultrasound-guided diagnostic and therapeutic paracentesis
yielding 2.5 liters of peritoneal fluid.

## 2020-01-19 IMAGING — RF DG SWALLOWING FUNCTION - NRPT [facility]
13 of 24 series · 13 of 24 positions shown · non-contrast
Comparison: none

[Series 1: run · 1 of 51 frames shown (1 of 13)]
[frame 1/51]
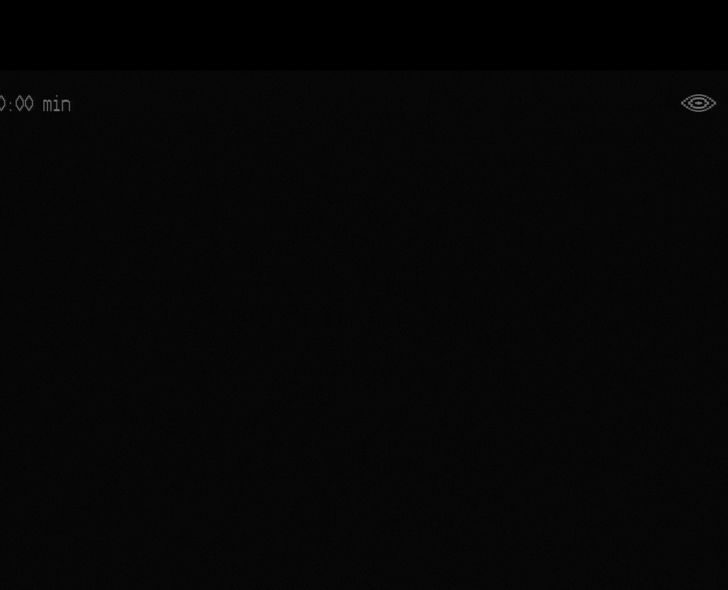

[Series 3: run · 1 of 225 frames shown (2 of 13)]
[frame 34/225]
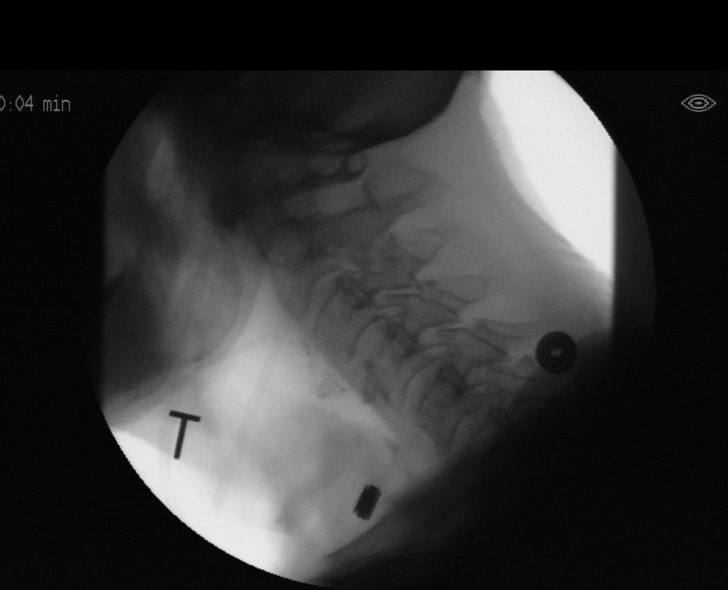

[Series 5: run · 1 of 31 frames shown (3 of 13)]
[frame 5/31]
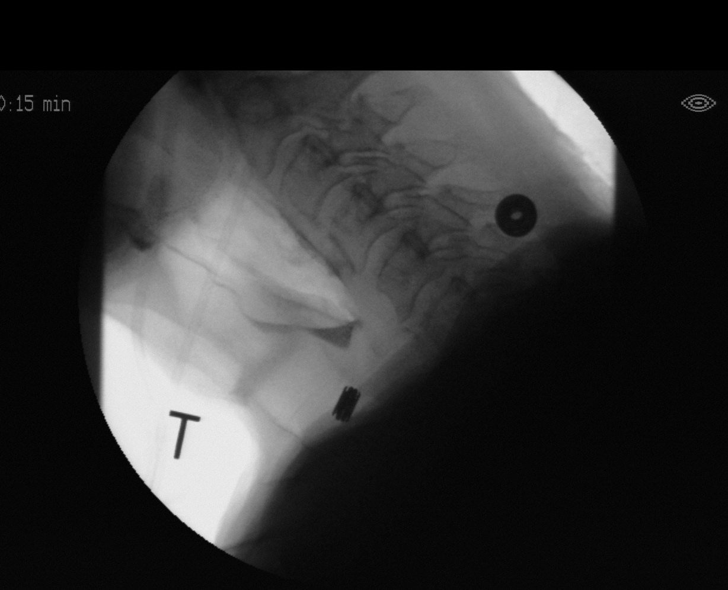

[Series 7: run · 1 of 51 frames shown (4 of 13)]
[frame 8/51]
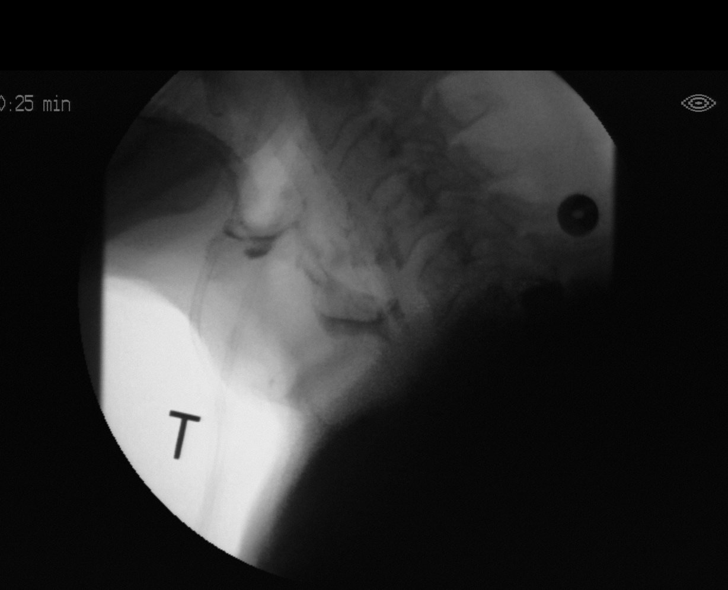

[Series 9: run · 1 of 96 frames shown (5 of 13)]
[frame 27/96]
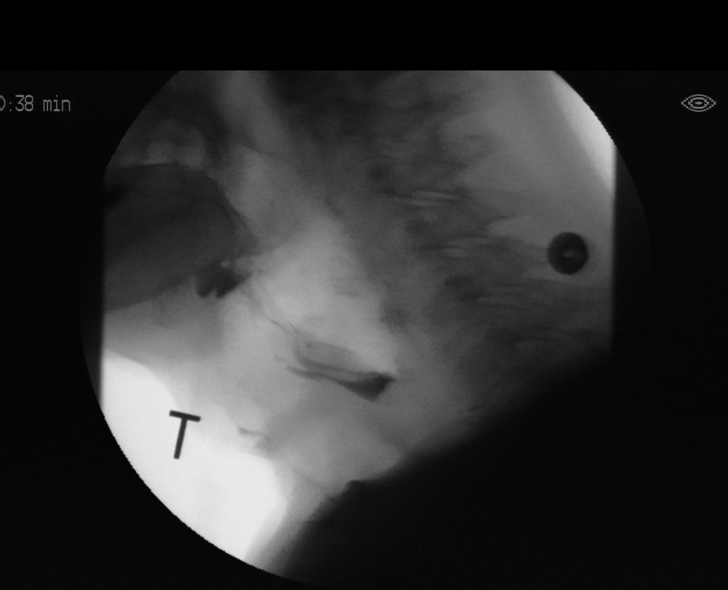

[Series 11: run · 1 of 118 frames shown (6 of 13)]
[frame 18/118]
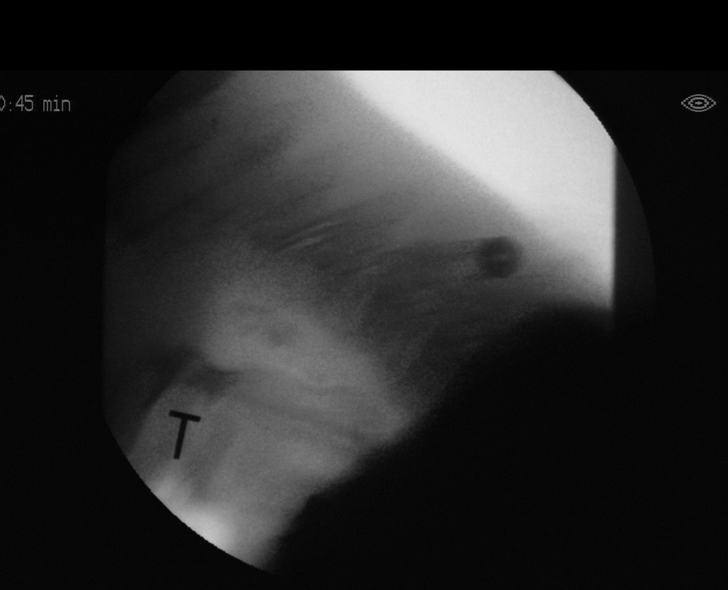

[Series 13: run · 1 of 48 frames shown (7 of 13)]
[frame 25/48]
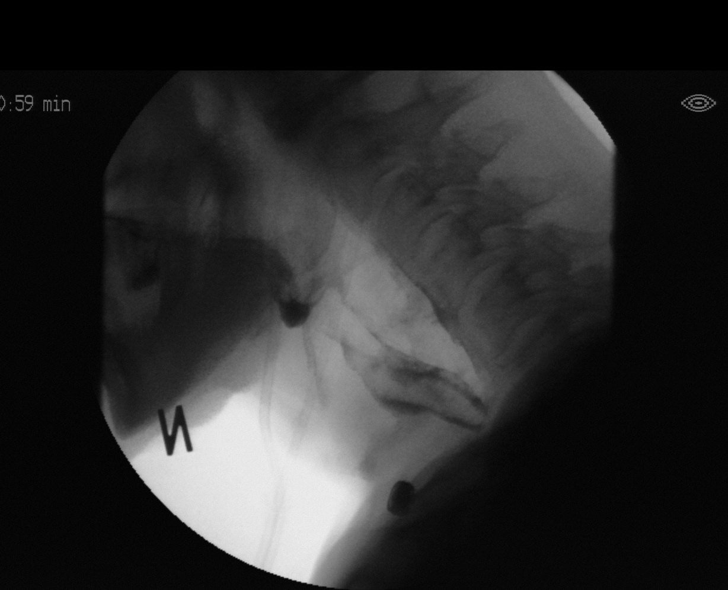

[Series 14: run · 1 of 150 frames shown (8 of 13)]
[frame 102/150]
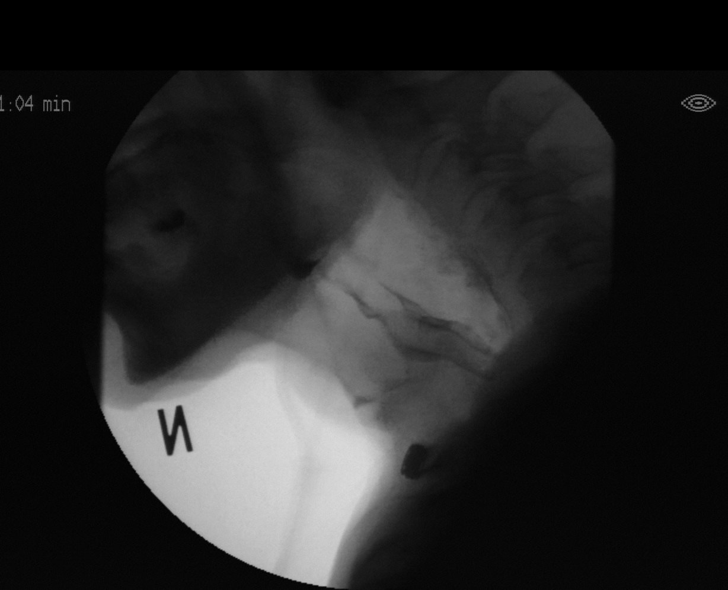

[Series 16: run · 1 of 108 frames shown (9 of 13)]
[frame 78/108]
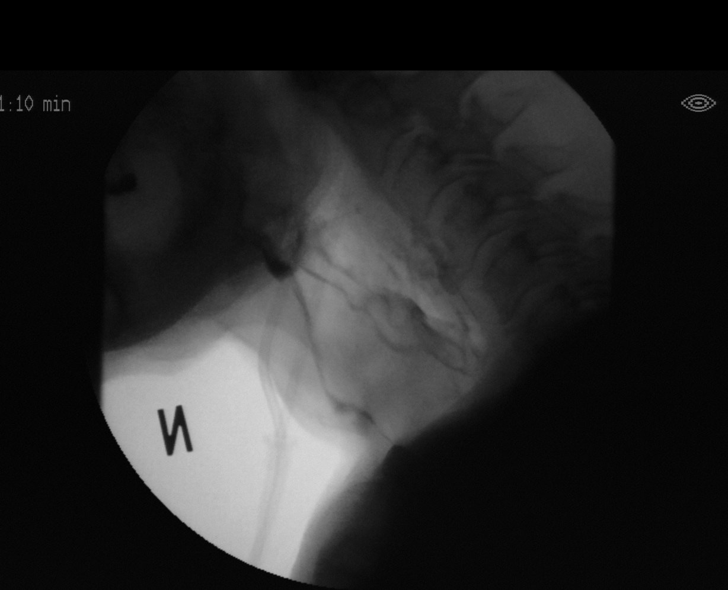

[Series 18: run · 1 of 48 frames shown (10 of 13)]
[frame 25/48]
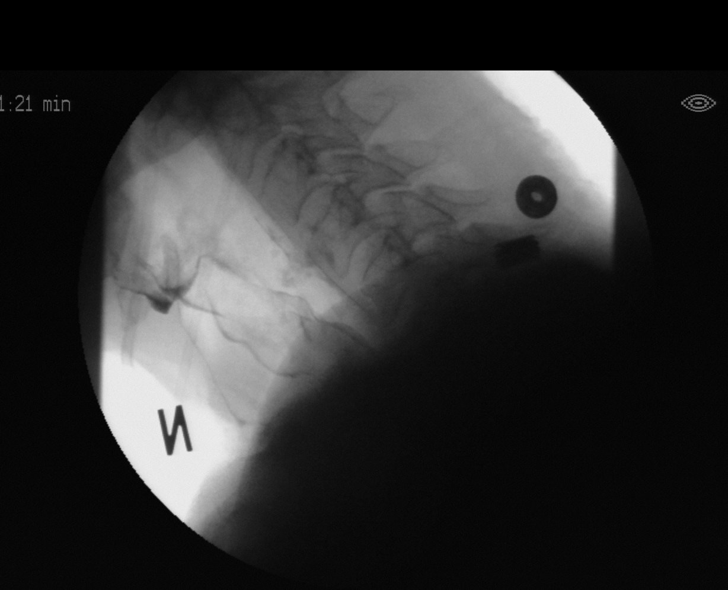

[Series 20: run · 1 of 413 frames shown (11 of 13)]
[frame 405/413]
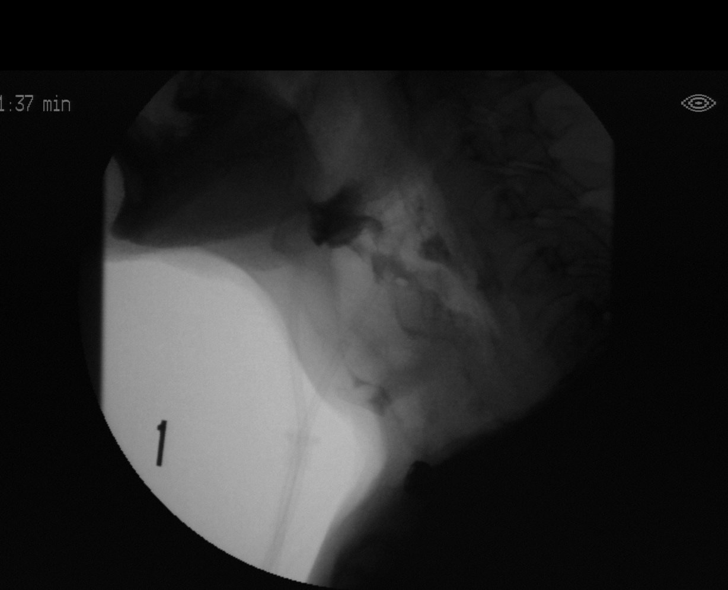

[Series 22: run · 1 of 525 frames shown (12 of 13)]
[frame 447/525]
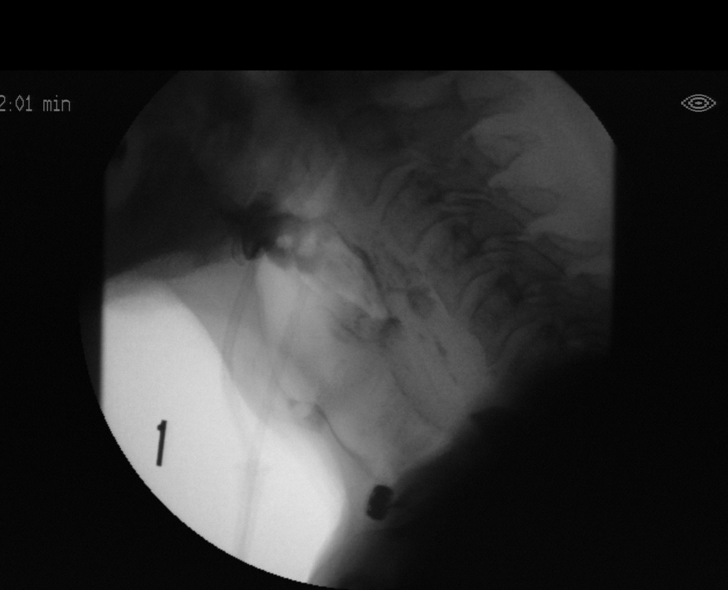

[Series 24: run · 1 of 186 frames shown (13 of 13)]
[frame 159/186]
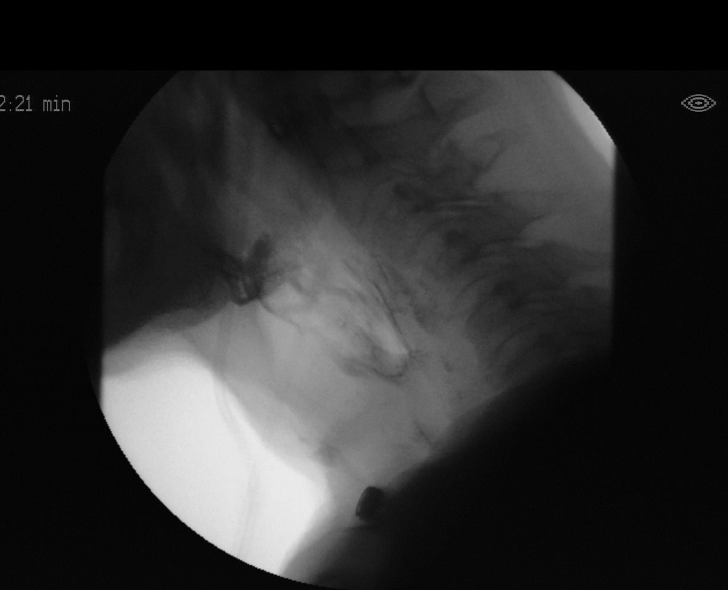

[13 of 24 positions shown; findings below may reference images not displayed]

FLUOROSCOPY FOR SWALLOWING FUNCTION STUDY:
Fluoroscopy was provided for swallowing function study, which was administered by a speech pathologist.  Final results and recommendations from this study are contained within the speech pathology report.
